# Patient Record
Sex: Male | Born: 1960 | Race: White | Hispanic: No | Marital: Single | Smoking: Current every day smoker
Health system: Southern US, Community
[De-identification: ages and names within clinical notes are randomized; demographics above are authoritative.]

## PROBLEM LIST (undated history)

## (undated) DIAGNOSIS — M1990 Unspecified osteoarthritis, unspecified site: Secondary | ICD-10-CM

## (undated) HISTORY — PX: SHOULDER SURGERY: SHX246

---

## 2008-04-16 ENCOUNTER — Emergency Department: Payer: Self-pay | Admitting: Emergency Medicine

## 2008-07-04 ENCOUNTER — Emergency Department: Payer: Self-pay | Admitting: Emergency Medicine

## 2009-12-18 ENCOUNTER — Emergency Department: Payer: Self-pay | Admitting: Internal Medicine

## 2010-09-26 ENCOUNTER — Ambulatory Visit: Payer: Self-pay

## 2011-02-07 ENCOUNTER — Ambulatory Visit: Payer: Self-pay | Admitting: Orthopedic Surgery

## 2011-02-10 ENCOUNTER — Ambulatory Visit: Payer: Self-pay | Admitting: Orthopedic Surgery

## 2013-03-31 ENCOUNTER — Emergency Department: Payer: Self-pay | Admitting: Emergency Medicine

## 2013-04-29 ENCOUNTER — Emergency Department: Payer: Self-pay | Admitting: Emergency Medicine

## 2015-11-09 ENCOUNTER — Emergency Department
Admission: EM | Admit: 2015-11-09 | Discharge: 2015-11-09 | Disposition: A | Discharge status: Home / Self Care | Payer: Self-pay | Attending: Emergency Medicine | Admitting: Emergency Medicine

## 2015-11-09 ENCOUNTER — Emergency Department: Payer: Self-pay

## 2015-11-09 ENCOUNTER — Encounter: Payer: Self-pay | Admitting: Emergency Medicine

## 2015-11-09 DIAGNOSIS — R079 Chest pain, unspecified: Secondary | ICD-10-CM | POA: Insufficient documentation

## 2015-11-09 LAB — CBC
HCT: 48.7 % (ref 40.0–52.0)
Hemoglobin: 15.9 g/dL (ref 13.0–18.0)
MCH: 31.8 pg (ref 26.0–34.0)
MCHC: 32.7 g/dL (ref 32.0–36.0)
MCV: 97.2 fL (ref 80.0–100.0)
PLATELETS: 292 10*3/uL (ref 150–440)
RBC: 5.01 MIL/uL (ref 4.40–5.90)
RDW: 14.5 % (ref 11.5–14.5)
WBC: 11.2 10*3/uL — AB (ref 3.8–10.6)

## 2015-11-09 LAB — BASIC METABOLIC PANEL
ANION GAP: 12 (ref 5–15)
BUN: 12 mg/dL (ref 6–20)
CALCIUM: 8.8 mg/dL — AB (ref 8.9–10.3)
CO2: 20 mmol/L — ABNORMAL LOW (ref 22–32)
CREATININE: 0.85 mg/dL (ref 0.61–1.24)
Chloride: 107 mmol/L (ref 101–111)
Glucose, Bld: 110 mg/dL — ABNORMAL HIGH (ref 65–99)
Potassium: 3.2 mmol/L — ABNORMAL LOW (ref 3.5–5.1)
SODIUM: 139 mmol/L (ref 135–145)

## 2015-11-09 LAB — TROPONIN I

## 2015-11-09 LAB — ETHANOL: ALCOHOL ETHYL (B): 153 mg/dL — AB (ref <5)

## 2015-11-09 MED ORDER — ASPIRIN 81 MG PO CHEW
324.0000 mg | CHEWABLE_TABLET | Freq: Once | ORAL | Status: AC
  Administered 2015-11-09: 324 mg via ORAL
  Filled 2015-11-09: qty 4

## 2015-11-09 MED ORDER — GI COCKTAIL ~~LOC~~
30.0000 mL | Freq: Once | ORAL | Status: AC
  Administered 2015-11-09: 30 mL via ORAL
  Filled 2015-11-09: qty 30

## 2015-11-09 NOTE — Discharge Instructions (Signed)
As we discussed please call the number provided to arrange a follow-up appointment as soon as possible to discuss further testing such as stress test, as well as your chest x-ray for further imaging, given possible mass. Please return to the emergency department for any further chest pain, trouble breathing, or any other symptom personally concerning to yourself.   Nonspecific Chest Pain It is often hard to find the cause of chest pain. There is always a chance that your pain could be related to something serious, such as a heart attack or a blood clot in your lungs. Chest pain can also be caused by conditions that are not life-threatening. If you have chest pain, it is very important to follow up with your doctor.  HOME CARE  If you were prescribed an antibiotic medicine, finish it all even if you start to feel better.  Avoid any activities that cause chest pain.  Do not use any tobacco products, including cigarettes, chewing tobacco, or electronic cigarettes. If you need help quitting, ask your doctor.  Do not drink alcohol.  Take medicines only as told by your doctor.  Keep all follow-up visits as told by your doctor. This is important. This includes any further testing if your chest pain does not go away.  Your doctor may tell you to keep your head raised (elevated) while you sleep.  Make lifestyle changes as told by your doctor. These may include:  Getting regular exercise. Ask your doctor to suggest some activities that are safe for you.  Eating a heart-healthy diet. Your doctor or a diet specialist (dietitian) can help you to learn healthy eating options.  Maintaining a healthy weight.  Managing diabetes, if necessary.  Reducing stress. GET HELP IF:  Your chest pain does not go away, even after treatment.  You have a rash with blisters on your chest.  You have a fever. GET HELP RIGHT AWAY IF:  Your chest pain is worse.  You have an increasing cough, or you cough up  blood.  You have severe belly (abdominal) pain.  You feel extremely weak.  You pass out (faint).  You have chills.  You have sudden, unexplained chest discomfort.  You have sudden, unexplained discomfort in your arms, back, neck, or jaw.  You have shortness of breath at any time.  You suddenly start to sweat, or your skin gets clammy.  You feel nauseous.  You vomit.  You suddenly feel light-headed or dizzy.  Your heart begins to beat quickly, or it feels like it is skipping beats. These symptoms may be an emergency. Do not wait to see if the symptoms will go away. Get medical help right away. Call your local emergency services (911 in the [place] Do not drive yourself to the hospital.   This information is not intended to replace advice given to you by your health care provider. Make sure you discuss any questions you have with your health care provider.   Document Released: 05/29/2008 Document Revised: 01/01/2015 Document Reviewed: 07/17/2014 Elsevier Interactive Patient Education [employer]2016 Elsevier Inc.

## 2015-11-09 NOTE — ED Provider Notes (Signed)
[facility] Emergency Department Provider Note  Time seen: 1:27 PM  I have reviewed the triage vital signs and the nursing notes.   HISTORY  Chief Complaint Chest Pain    HPI Duane Hayes is a 54 y.o. male with no past medical history who presents the emergency department with chest pain 2 days. According to the patient for the past 2 days he has been having intermittent central chest pain. States at times it'll be severe, shooting pains, at other times it will resolve completely. He states some association with nausea and diaphoresis during severe pain. None currently.Last episode of pain was this morning.     History reviewed. No pertinent past medical history.  There are no active problems to display for this patient.   History reviewed. No pertinent past surgical history.  No current outpatient prescriptions on file.  Allergies Review of patient's allergies indicates no known allergies.  History reviewed. No pertinent family history.  Social History Social History  Substance Use Topics  . Smoking status: Never Smoker   . Smokeless tobacco: None  . Alcohol Use: Yes    Review of Systems Constitutional: Negative for fever. Cardiovascular: As it for intermittent chest pain Respiratory: Negative for shortness of breath. Gastrointestinal: Negative for abdominal pain Musculoskeletal: Negative for back pain. Neurological: Negative for headache 10-point ROS otherwise negative.  ____________________________________________   PHYSICAL EXAM:  VITAL SIGNS: ED Triage Vitals  Enc Vitals Group     BP 11/09/15 1240 164/126 mmHg     Pulse Rate 11/09/15 1240 107     Resp 11/09/15 1240 28     Temp 11/09/15 1240 98 F (36.7 C)     Temp Source 11/09/15 1240 Oral     SpO2 11/09/15 1240 97 %     Weight 11/09/15 1240 150 lb (68.04 kg)     Height 11/09/15 1240  (1.753 m)     Head Cir --      Peak Flow --      Pain Score 11/09/15 1240  10     Pain Loc --      Pain Edu? --      Excl. in GC? --     Constitutional: Alert and oriented. Well appearing and in no distress. Eyes: Normal exam ENT   Head: Normocephalic and atraumatic.   Mouth/Throat: Mucous membranes are moist. Cardiovascular: Normal rate, regular rhythm. No murmur Respiratory: Normal respiratory effort without tachypnea nor retractions. Breath sounds are clear and equal bilaterally. No wheezes/rales/rhonchi. Nontender to palpation. Gastrointestinal: Soft and nontender. No distention.   Musculoskeletal: Nontender with normal range of motion in all extremities.  Neurologic:  Normal speech and language. No gross focal neurologic deficits  Skin:  Skin is warm, dry and intact.  Psychiatric: Mood and affect are normal. Speech and behavior are normal.   ____________________________________________    EKG  EKG reviewed and interpreted by myself shows sinus rhythm at 92 bpm, narrow QRS, normal axis, normal intervals, nonspecific ST changes present. No ST elevations  ____________________________________________    RADIOLOGY  Airway thickening suggesting bronchitis or reactive airway disease. Possible nipple shadow versus mass.  ____________________________________________   INITIAL IMPRESSION / ASSESSMENT AND PLAN / ED COURSE  Pertinent labs & imaging results that were available during my care of the patient were reviewed by me and considered in my medical decision making (see chart for details).  Patient presents for intermittent chest pain 2 days. We will check labs including troponin. Chest x-ray. EKG.  Labs are within normal limits. Troponin negative. EKG shows no concerning changes. Chest x-ray shows possible nipple shadow versus mass, discussed this with the patient the need to follow up with his primary care physician for further evaluation, as well as stress test. [facility] agreeable.    ____________________________________________   FINAL CLINICAL IMPRESSION(S) / ED DIAGNOSES  Chest pain   Minna AntisKevin Paislee Szatkowski, MD 11/09/15 [phone]

## 2015-11-09 NOTE — ED Notes (Signed)
Pt to ed with c/o chest pain that started yesterday,  Pt appears very anxious, leaning over in wheelchair, crying,  Pt states severe pain, reports sob, weakness, back pain, n/v, diaphoresis and dizziness associated with the pain.

## 2016-01-21 ENCOUNTER — Inpatient Hospital Stay
Admission: EM | Admit: 2016-01-21 | Discharge: 2016-02-07 | DRG: 917 | Payer: Self-pay | Attending: Internal Medicine | Admitting: Internal Medicine

## 2016-01-21 ENCOUNTER — Emergency Department: Payer: Self-pay

## 2016-01-21 ENCOUNTER — Encounter: Payer: Self-pay | Admitting: *Deleted

## 2016-01-21 DIAGNOSIS — W938XXA Exposure to other excessive cold of man-made origin, initial encounter: Secondary | ICD-10-CM

## 2016-01-21 DIAGNOSIS — R402232 Coma scale, best verbal response, inappropriate words, at arrival to emergency department: Secondary | ICD-10-CM | POA: Diagnosis present

## 2016-01-21 DIAGNOSIS — J690 Pneumonitis due to inhalation of food and vomit: Secondary | ICD-10-CM | POA: Diagnosis present

## 2016-01-21 DIAGNOSIS — R454 Irritability and anger: Secondary | ICD-10-CM | POA: Diagnosis present

## 2016-01-21 DIAGNOSIS — F10231 Alcohol dependence with withdrawal delirium: Secondary | ICD-10-CM | POA: Diagnosis not present

## 2016-01-21 DIAGNOSIS — Z23 Encounter for immunization: Secondary | ICD-10-CM

## 2016-01-21 DIAGNOSIS — R4182 Altered mental status, unspecified: Secondary | ICD-10-CM

## 2016-01-21 DIAGNOSIS — F1510 Other stimulant abuse, uncomplicated: Secondary | ICD-10-CM | POA: Diagnosis present

## 2016-01-21 DIAGNOSIS — I469 Cardiac arrest, cause unspecified: Secondary | ICD-10-CM | POA: Diagnosis present

## 2016-01-21 DIAGNOSIS — G92 Toxic encephalopathy: Secondary | ICD-10-CM | POA: Diagnosis present

## 2016-01-21 DIAGNOSIS — R0600 Dyspnea, unspecified: Secondary | ICD-10-CM

## 2016-01-21 DIAGNOSIS — F1410 Cocaine abuse, uncomplicated: Secondary | ICD-10-CM | POA: Diagnosis present

## 2016-01-21 DIAGNOSIS — R402112 Coma scale, eyes open, never, at arrival to emergency department: Secondary | ICD-10-CM | POA: Diagnosis present

## 2016-01-21 DIAGNOSIS — J189 Pneumonia, unspecified organism: Secondary | ICD-10-CM

## 2016-01-21 DIAGNOSIS — R402342 Coma scale, best motor response, flexion withdrawal, at arrival to emergency department: Secondary | ICD-10-CM | POA: Diagnosis present

## 2016-01-21 DIAGNOSIS — F1910 Other psychoactive substance abuse, uncomplicated: Secondary | ICD-10-CM

## 2016-01-21 DIAGNOSIS — F10129 Alcohol abuse with intoxication, unspecified: Secondary | ICD-10-CM | POA: Diagnosis present

## 2016-01-21 DIAGNOSIS — I959 Hypotension, unspecified: Secondary | ICD-10-CM | POA: Diagnosis present

## 2016-01-21 DIAGNOSIS — T68XXXA Hypothermia, initial encounter: Secondary | ICD-10-CM | POA: Diagnosis present

## 2016-01-21 DIAGNOSIS — Y900 Blood alcohol level of less than 20 mg/100 ml: Secondary | ICD-10-CM | POA: Diagnosis present

## 2016-01-21 DIAGNOSIS — F17210 Nicotine dependence, cigarettes, uncomplicated: Secondary | ICD-10-CM | POA: Diagnosis present

## 2016-01-21 DIAGNOSIS — E872 Acidosis: Secondary | ICD-10-CM | POA: Diagnosis present

## 2016-01-21 DIAGNOSIS — F1990 Other psychoactive substance use, unspecified, uncomplicated: Secondary | ICD-10-CM

## 2016-01-21 DIAGNOSIS — Z95828 Presence of other vascular implants and grafts: Secondary | ICD-10-CM

## 2016-01-21 DIAGNOSIS — R45851 Suicidal ideations: Secondary | ICD-10-CM | POA: Diagnosis not present

## 2016-01-21 DIAGNOSIS — T50904A Poisoning by unspecified drugs, medicaments and biological substances, undetermined, initial encounter: Principal | ICD-10-CM | POA: Diagnosis present

## 2016-01-21 DIAGNOSIS — E876 Hypokalemia: Secondary | ICD-10-CM | POA: Diagnosis not present

## 2016-01-21 DIAGNOSIS — G9340 Encephalopathy, unspecified: Secondary | ICD-10-CM

## 2016-01-21 DIAGNOSIS — L89329 Pressure ulcer of left buttock, unspecified stage: Secondary | ICD-10-CM | POA: Diagnosis present

## 2016-01-21 DIAGNOSIS — D649 Anemia, unspecified: Secondary | ICD-10-CM | POA: Diagnosis present

## 2016-01-21 DIAGNOSIS — F1210 Cannabis abuse, uncomplicated: Secondary | ICD-10-CM | POA: Diagnosis present

## 2016-01-21 DIAGNOSIS — R739 Hyperglycemia, unspecified: Secondary | ICD-10-CM | POA: Diagnosis present

## 2016-01-21 DIAGNOSIS — Y9259 Other trade areas as the place of occurrence of the external cause: Secondary | ICD-10-CM

## 2016-01-21 DIAGNOSIS — B9620 Unspecified Escherichia coli [E. coli] as the cause of diseases classified elsewhere: Secondary | ICD-10-CM | POA: Diagnosis present

## 2016-01-21 DIAGNOSIS — Z8674 Personal history of sudden cardiac arrest: Secondary | ICD-10-CM

## 2016-01-21 DIAGNOSIS — F1110 Opioid abuse, uncomplicated: Secondary | ICD-10-CM | POA: Diagnosis present

## 2016-01-21 DIAGNOSIS — J9601 Acute respiratory failure with hypoxia: Secondary | ICD-10-CM | POA: Diagnosis present

## 2016-01-21 DIAGNOSIS — I493 Ventricular premature depolarization: Secondary | ICD-10-CM | POA: Diagnosis not present

## 2016-01-21 DIAGNOSIS — R001 Bradycardia, unspecified: Secondary | ICD-10-CM | POA: Diagnosis present

## 2016-01-21 HISTORY — DX: Unspecified osteoarthritis, unspecified site: M19.90

## 2016-01-21 LAB — CBC WITH DIFFERENTIAL/PLATELET
Basophils Absolute: 0.1 10*3/uL (ref 0–0.1)
Basophils Relative: 1 %
Eosinophils Absolute: 0.2 10*3/uL (ref 0–0.7)
Eosinophils Relative: 2 %
HCT: 42.6 % (ref 40.0–52.0)
Hemoglobin: 14.1 g/dL (ref 13.0–18.0)
Lymphocytes Relative: 38 %
Lymphs Abs: 3.4 10*3/uL (ref 1.0–3.6)
MCH: 31.8 pg (ref 26.0–34.0)
MCHC: 33.1 g/dL (ref 32.0–36.0)
MCV: 96.3 fL (ref 80.0–100.0)
Monocytes Absolute: 0.5 10*3/uL (ref 0.2–1.0)
Monocytes Relative: 6 %
Neutro Abs: 4.8 10*3/uL (ref 1.4–6.5)
Neutrophils Relative %: 53 %
Platelets: 252 10*3/uL (ref 150–440)
RBC: 4.42 MIL/uL (ref 4.40–5.90)
RDW: 14.8 % — ABNORMAL HIGH (ref 11.5–14.5)
WBC: 9 10*3/uL (ref 3.8–10.6)

## 2016-01-21 LAB — COMPREHENSIVE METABOLIC PANEL
ALT: 37 U/L (ref 17–63)
ALT: 32 U/L (ref 17–63)
AST: 38 U/L (ref 15–41)
AST: 36 U/L (ref 15–41)
Albumin: 4.1 g/dL (ref 3.5–5.0)
Albumin: 3.2 g/dL — ABNORMAL LOW (ref 3.5–5.0)
Alkaline Phosphatase: 113 U/L (ref 38–126)
Alkaline Phosphatase: 84 U/L (ref 38–126)
Anion gap: 17 — ABNORMAL HIGH (ref 5–15)
Anion gap: 10 (ref 5–15)
BILIRUBIN TOTAL: 0.8 mg/dL (ref 0.3–1.2)
BILIRUBIN TOTAL: 1 mg/dL (ref 0.3–1.2)
BUN: 12 mg/dL (ref 6–20)
BUN: 14 mg/dL (ref 6–20)
CALCIUM: 7.4 mg/dL — AB (ref 8.9–10.3)
CHLORIDE: 111 mmol/L (ref 101–111)
CO2: 19 mmol/L — ABNORMAL LOW (ref 22–32)
CO2: 24 mmol/L (ref 22–32)
CREATININE: 1.21 mg/dL (ref 0.61–1.24)
CREATININE: 0.87 mg/dL (ref 0.61–1.24)
Calcium: 8.2 mg/dL — ABNORMAL LOW (ref 8.9–10.3)
Chloride: 103 mmol/L (ref 101–111)
Glucose, Bld: 339 mg/dL — ABNORMAL HIGH (ref 65–99)
Glucose, Bld: 83 mg/dL (ref 65–99)
POTASSIUM: 3.6 mmol/L (ref 3.5–5.1)
Potassium: 3.8 mmol/L (ref 3.5–5.1)
Sodium: 139 mmol/L (ref 135–145)
Sodium: 145 mmol/L (ref 135–145)
TOTAL PROTEIN: 7.1 g/dL (ref 6.5–8.1)
TOTAL PROTEIN: 5.5 g/dL — AB (ref 6.5–8.1)

## 2016-01-21 LAB — BLOOD GAS, ARTERIAL
ACID-BASE DEFICIT: 14.6 mmol/L — AB (ref 0.0–2.0)
ACID-BASE EXCESS: 1.3 mmol/L (ref 0.0–3.0)
Allens test (pass/fail): POSITIVE — AB
Allens test (pass/fail): POSITIVE — AB
BICARBONATE: 14.9 meq/L — AB (ref 21.0–28.0)
BICARBONATE: 27.2 meq/L (ref 21.0–28.0)
FIO2: 0.4
FIO2: 0.3
LHR: 16 {breaths}/min
MECHVT: 450 mL
Mechanical Rate: 16
O2 Saturation: 89.4 %
O2 Saturation: 98.1 %
PATIENT TEMPERATURE: 37
PEEP/CPAP: 5 cmH2O
PEEP/CPAP: 5 cmH2O
PH ART: 7.37 (ref 7.350–7.450)
PO2 ART: 78 mmHg — AB (ref 83.0–108.0)
Patient temperature: 37
RATE: 18 resp/min
VT: 450 mL
pCO2 arterial: 48 mmHg (ref 32.0–48.0)
pCO2 arterial: 47 mmHg (ref 32.0–48.0)
pH, Arterial: 7.1 — CL (ref 7.350–7.450)
pO2, Arterial: 108 mmHg (ref 83.0–108.0)

## 2016-01-21 LAB — CBC
HEMATOCRIT: 36.9 % — AB (ref 40.0–52.0)
Hemoglobin: 12.4 g/dL — ABNORMAL LOW (ref 13.0–18.0)
MCH: 31.5 pg (ref 26.0–34.0)
MCHC: 33.5 g/dL (ref 32.0–36.0)
MCV: 93.8 fL (ref 80.0–100.0)
PLATELETS: 211 10*3/uL (ref 150–440)
RBC: 3.93 MIL/uL — AB (ref 4.40–5.90)
RDW: 14.4 % (ref 11.5–14.5)
WBC: 10.4 10*3/uL (ref 3.8–10.6)

## 2016-01-21 LAB — URINALYSIS COMPLETE WITH MICROSCOPIC (ARMC ONLY)
Bacteria, UA: NONE SEEN
Bilirubin Urine: NEGATIVE
Glucose, UA: >500 mg/dL — AB
Hgb urine dipstick: NEGATIVE
Ketones, ur: NEGATIVE mg/dL
Leukocytes, UA: NEGATIVE
Nitrite: NEGATIVE
Protein, ur: 30 mg/dL — AB
Specific Gravity, Urine: 1.011 (ref 1.005–1.030)
pH: 6 (ref 5.0–8.0)

## 2016-01-21 LAB — TSH: TSH: 2.145 u[IU]/mL (ref 0.350–4.500)

## 2016-01-21 LAB — TRIGLYCERIDES: Triglycerides: 97 mg/dL (ref <150)

## 2016-01-21 LAB — ACETAMINOPHEN LEVEL

## 2016-01-21 LAB — MRSA PCR SCREENING: MRSA by PCR: NEGATIVE

## 2016-01-21 LAB — URINE DRUG SCREEN, QUALITATIVE (ARMC ONLY)
Amphetamines, Ur Screen: POSITIVE — AB
BENZODIAZEPINE, UR SCRN: NOT DETECTED
Barbiturates, Ur Screen: NOT DETECTED
CANNABINOID 50 NG, UR ~~LOC~~: POSITIVE — AB
Cocaine Metabolite,Ur ~~LOC~~: POSITIVE — AB
MDMA (Ecstasy)Ur Screen: NOT DETECTED
Methadone Scn, Ur: NOT DETECTED
Opiate, Ur Screen: NOT DETECTED
PHENCYCLIDINE (PCP) UR S: NOT DETECTED
TRICYCLIC, UR SCREEN: POSITIVE — AB

## 2016-01-21 LAB — GLUCOSE, CAPILLARY
Glucose-Capillary: 76 mg/dL (ref 65–99)
Glucose-Capillary: 87 mg/dL (ref 65–99)
Glucose-Capillary: 90 mg/dL (ref 65–99)

## 2016-01-21 LAB — MAGNESIUM: Magnesium: 2.7 mg/dL — ABNORMAL HIGH (ref 1.7–2.4)

## 2016-01-21 LAB — TROPONIN I

## 2016-01-21 LAB — SALICYLATE LEVEL: Salicylate Lvl: <4 mg/dL (ref 2.8–30.0)

## 2016-01-21 LAB — ETHANOL: Alcohol, Ethyl (B): 131 mg/dL — ABNORMAL HIGH (ref <5)

## 2016-01-21 LAB — OSMOLALITY: Osmolality: 337 mOsm/kg (ref 275–295)

## 2016-01-21 LAB — HEMOGLOBIN A1C: Hgb A1c MFr Bld: 5.6 % (ref 4.0–6.0)

## 2016-01-21 MED ORDER — PNEUMOCOCCAL VAC POLYVALENT 25 MCG/0.5ML IJ INJ
0.5000 mL | INJECTION | INTRAMUSCULAR | Status: DC
  Filled 2016-01-21: qty 0.5

## 2016-01-21 MED ORDER — SODIUM CHLORIDE 0.9 % IV SOLN
3.0000 g | Freq: Four times a day (QID) | INTRAVENOUS | Status: DC
  Administered 2016-01-21 – 2016-01-31 (×38): 3 g via INTRAVENOUS
  Filled 2016-01-21 (×42): qty 3

## 2016-01-21 MED ORDER — PROPOFOL 10 MG/ML IV BOLUS
40.0000 mg | Freq: Once | INTRAVENOUS | Status: AC
  Administered 2016-01-21: 40 mg via INTRAVENOUS

## 2016-01-21 MED ORDER — CALCIUM GLUCONATE 10 % IV SOLN
INTRAVENOUS | Status: AC
  Filled 2016-01-21: qty 10

## 2016-01-21 MED ORDER — MIDAZOLAM HCL 2 MG/2ML IJ SOLN: 2.0000 mg | INTRAMUSCULAR | Status: DC | PRN

## 2016-01-21 MED ORDER — ACETAMINOPHEN 325 MG PO TABS
650.0000 mg | ORAL_TABLET | Freq: Four times a day (QID) | ORAL | Status: DC | PRN
  Administered 2016-01-22 – 2016-02-07 (×5): 650 mg via ORAL
  Filled 2016-01-21 (×5): qty 2

## 2016-01-21 MED ORDER — SODIUM CHLORIDE 0.9 % IV SOLN
INTRAVENOUS | Status: DC
  Administered 2016-01-21 (×2): via INTRAVENOUS

## 2016-01-21 MED ORDER — ETOMIDATE 2 MG/ML IV SOLN
20.0000 mg | Freq: Once | INTRAVENOUS | Status: AC
  Administered 2016-01-21: 20 mg via INTRAVENOUS

## 2016-01-21 MED ORDER — FOLIC ACID 5 MG/ML IJ SOLN
Freq: Every day | INTRAMUSCULAR | Status: DC
  Administered 2016-01-21 – 2016-01-24 (×4): via INTRAVENOUS
  Filled 2016-01-21 (×4): qty 50

## 2016-01-21 MED ORDER — FOLIC ACID 5 MG/ML IJ SOLN: 1.0000 mg | Freq: Every day | INTRAMUSCULAR | Status: DC

## 2016-01-21 MED ORDER — PANTOPRAZOLE SODIUM 40 MG IV SOLR
40.0000 mg | INTRAVENOUS | Status: DC
  Administered 2016-01-21 – 2016-01-23 (×3): 40 mg via INTRAVENOUS
  Filled 2016-01-21 (×3): qty 40

## 2016-01-21 MED ORDER — SODIUM CHLORIDE 0.9 % IV BOLUS (SEPSIS)
1000.0000 mL | INTRAVENOUS | Status: AC
  Administered 2016-01-21: 1000 mL via INTRAVENOUS

## 2016-01-21 MED ORDER — PROPOFOL 1000 MG/100ML IV EMUL
5.0000 ug/kg/min | INTRAVENOUS | Status: DC
  Administered 2016-01-21: 60 mg/h via INTRAVENOUS
  Administered 2016-01-21: 10 ug/kg/min via INTRAVENOUS
  Administered 2016-01-21: 5 ug/kg/min via INTRAVENOUS

## 2016-01-21 MED ORDER — INFLUENZA VAC SPLIT QUAD 0.5 ML IM SUSY
0.5000 mL | PREFILLED_SYRINGE | INTRAMUSCULAR | Status: DC
  Filled 2016-01-21: qty 0.5

## 2016-01-21 MED ORDER — THIAMINE HCL 100 MG/ML IJ SOLN
100.0000 mg | Freq: Once | INTRAMUSCULAR | Status: AC
  Administered 2016-01-21: 100 mg via INTRAVENOUS
  Filled 2016-01-21: qty 2

## 2016-01-21 MED ORDER — ROCURONIUM BROMIDE 50 MG/5ML IV SOLN
100.0000 mg | Freq: Once | INTRAVENOUS | Status: AC
  Administered 2016-01-21: 100 mg via INTRAVENOUS

## 2016-01-21 MED ORDER — FENTANYL 2500MCG IN NS 250ML (10MCG/ML) PREMIX INFUSION
150.0000 ug/h | INTRAVENOUS | Status: DC
  Administered 2016-01-21: 150 ug/h via INTRAVENOUS
  Administered 2016-01-21 – 2016-01-22 (×2): 175 ug/h via INTRAVENOUS
  Filled 2016-01-21 (×2): qty 250

## 2016-01-21 MED ORDER — CHLORHEXIDINE GLUCONATE 0.12% ORAL RINSE (MEDLINE KIT)
15.0000 mL | Freq: Two times a day (BID) | OROMUCOSAL | Status: DC
  Administered 2016-01-21 – 2016-01-26 (×11): 15 mL via OROMUCOSAL
  Filled 2016-01-21 (×13): qty 15

## 2016-01-21 MED ORDER — PROPOFOL 1000 MG/100ML IV EMUL
INTRAVENOUS | Status: AC
  Administered 2016-01-21: 5 ug/kg/min via INTRAVENOUS
  Filled 2016-01-21: qty 100

## 2016-01-21 MED ORDER — ONDANSETRON 4 MG PO TBDP: ORAL_TABLET | ORAL | Status: DC

## 2016-01-21 MED ORDER — SODIUM CHLORIDE 0.9 % IV SOLN
1.5000 g | Freq: Four times a day (QID) | INTRAVENOUS | Status: DC
  Filled 2016-01-21: qty 1.5

## 2016-01-21 MED ORDER — PROPOFOL 1000 MG/100ML IV EMUL: 5.0000 ug/kg/min | INTRAVENOUS | Status: DC

## 2016-01-21 MED ORDER — PROPOFOL 1000 MG/100ML IV EMUL: 0.0000 ug/kg/min | INTRAVENOUS | Status: DC

## 2016-01-21 MED ORDER — MIDAZOLAM HCL 5 MG/5ML IJ SOLN
4.0000 mg | Freq: Once | INTRAMUSCULAR | Status: AC
  Administered 2016-01-21: 4 mg via INTRAVENOUS

## 2016-01-21 MED ORDER — ONDANSETRON HCL 4 MG/2ML IJ SOLN: 4.0000 mg | Freq: Four times a day (QID) | INTRAMUSCULAR | Status: DC | PRN

## 2016-01-21 MED ORDER — HEPARIN SODIUM (PORCINE) 5000 UNIT/ML IJ SOLN
5000.0000 [IU] | Freq: Three times a day (TID) | INTRAMUSCULAR | Status: DC
  Administered 2016-01-21 – 2016-02-01 (×34): 5000 [IU] via SUBCUTANEOUS
  Filled 2016-01-21 (×34): qty 1

## 2016-01-21 MED ORDER — SODIUM CHLORIDE 0.9% FLUSH
3.0000 mL | Freq: Two times a day (BID) | INTRAVENOUS | Status: DC
  Administered 2016-01-21 – 2016-02-07 (×29): 3 mL via INTRAVENOUS

## 2016-01-21 MED ORDER — CALCIUM GLUCONATE 10 % IV SOLN
1.0000 g | Freq: Once | INTRAVENOUS | Status: AC
  Administered 2016-01-21: 1 g via INTRAVENOUS
  Filled 2016-01-21: qty 10

## 2016-01-21 MED ORDER — FENTANYL CITRATE (PF) 100 MCG/2ML IJ SOLN
50.0000 ug | INTRAMUSCULAR | Status: DC | PRN
Start: 2016-01-21 — End: 2016-01-22

## 2016-01-21 MED ORDER — INSULIN ASPART 100 UNIT/ML ~~LOC~~ SOLN
2.0000 [IU] | SUBCUTANEOUS | Status: DC
  Administered 2016-01-23 – 2016-01-25 (×3): 2 [IU] via SUBCUTANEOUS
  Administered 2016-01-25: 4 [IU] via SUBCUTANEOUS
  Administered 2016-01-25: 2 [IU] via SUBCUTANEOUS
  Administered 2016-01-26 (×2): 4 [IU] via SUBCUTANEOUS
  Administered 2016-01-26 – 2016-01-28 (×4): 2 [IU] via SUBCUTANEOUS
  Filled 2016-01-21: qty 4
  Filled 2016-01-21 (×6): qty 2
  Filled 2016-01-21: qty 4
  Filled 2016-01-21: qty 2
  Filled 2016-01-21: qty 4
  Filled 2016-01-21: qty 2
  Filled 2016-01-21: qty 4

## 2016-01-21 MED ORDER — THIAMINE HCL 100 MG/ML IJ SOLN
100.0000 mg | Freq: Every day | INTRAMUSCULAR | Status: DC
  Administered 2016-01-22: 100 mg via INTRAVENOUS
  Administered 2016-01-23: 200 mg via INTRAVENOUS
  Administered 2016-01-24: 100 mg via INTRAVENOUS
  Filled 2016-01-21 (×3): qty 2

## 2016-01-21 MED ORDER — FENTANYL CITRATE (PF) 100 MCG/2ML IJ SOLN: 50.0000 ug | INTRAMUSCULAR | Status: DC | PRN

## 2016-01-21 MED ORDER — AMPICILLIN-SULBACTAM SODIUM 3 (2-1) G IJ SOLR
3.0000 g | INTRAMUSCULAR | Status: AC
  Administered 2016-01-21: 3 g via INTRAVENOUS
  Filled 2016-01-21: qty 3

## 2016-01-21 MED ORDER — ACETAMINOPHEN 650 MG RE SUPP: 650.0000 mg | Freq: Four times a day (QID) | RECTAL | Status: DC | PRN

## 2016-01-21 MED ORDER — ONDANSETRON HCL 4 MG PO TABS: 4.0000 mg | ORAL_TABLET | Freq: Four times a day (QID) | ORAL | Status: DC | PRN

## 2016-01-21 MED ORDER — ANTISEPTIC ORAL RINSE SOLUTION (CORINZ)
7.0000 mL | Freq: Four times a day (QID) | OROMUCOSAL | Status: DC
  Administered 2016-01-21 – 2016-01-26 (×20): 7 mL via OROMUCOSAL
  Filled 2016-01-21 (×23): qty 7

## 2016-01-21 MED ORDER — SODIUM CHLORIDE 0.9 % IV SOLN
1.0000 mg/h | INTRAVENOUS | Status: DC
  Administered 2016-01-21: 1 mg/h via INTRAVENOUS
  Administered 2016-01-21: 2 mg/h via INTRAVENOUS
  Filled 2016-01-21 (×2): qty 10

## 2016-01-21 MED ORDER — MIDAZOLAM HCL 5 MG/5ML IJ SOLN
INTRAMUSCULAR | Status: AC
  Administered 2016-01-21: 4 mg via INTRAVENOUS
  Filled 2016-01-21: qty 5

## 2016-01-21 MED ORDER — SODIUM CHLORIDE 0.9 % IV BOLUS (SEPSIS)
1000.0000 mL | Freq: Once | INTRAVENOUS | Status: AC
  Administered 2016-01-21: 1000 mL via INTRAVENOUS

## 2016-01-21 MED ORDER — MIDAZOLAM HCL 2 MG/2ML IJ SOLN
4.0000 mg | Freq: Once | INTRAMUSCULAR | Status: AC
  Administered 2016-01-21: 4 mg via INTRAVENOUS

## 2016-01-21 MED ORDER — SODIUM BICARBONATE 8.4 % IV SOLN
100.0000 meq | Freq: Once | INTRAVENOUS | Status: AC
  Administered 2016-01-21: 100 meq via INTRAVENOUS
  Filled 2016-01-21: qty 100

## 2016-01-21 MED ORDER — FENTANYL 2500MCG IN NS 250ML (10MCG/ML) PREMIX INFUSION
INTRAVENOUS | Status: AC
  Administered 2016-01-21: 150 ug/h via INTRAVENOUS
  Filled 2016-01-21: qty 250

## 2016-01-21 NOTE — Progress Notes (Signed)
Discussed with Dr. Dema Severin during rounds that per xray patient's OG tube is coiled in stomach and MD stated that it would be readressed in the morning with xray and that patient should be able to be extubated in the morning.

## 2016-01-21 NOTE — Consult Note (Signed)
PULMONARY / CRITICAL CARE MEDICINE   Name: Duane Hayes MRN: [id number] DOB: 07-13-1961    ADMISSION DATE:  01/21/2016 CONSULTATION DATE:  01/21/16  REFERRING MD :  Dr. Enedina Finner   CHIEF COMPLAINT:     Unresponsive Reason for consult - ventilator and critical care management.    HISTORY OF PRESENT ILLNESS  History per chart review - patient intubated and sedated.    The patient presents the emergency department via EMS on 1/27 AM after being found unconscious in a motel room. The patient was found with ice all over his body. Initially the patient's pulse was about 20 bpm and respirations were shallow, per records. The patient was given Narcan and became very agitated. During an attempt to subdue the patient in order to supply supplemental oxygen he lost a pulse. CPR was started and achieved return of circulation. The patient was intubated continue to thrash. EMS personnel took this to mean that the patient may protect his airway and subsequently extubated him. However upon arrival in the emergency department the patient was not protecting his airway which prompted reintubation. He was placed on mechanical ventilation and transferred to the ICU.  Review of labs showed elevated EtOH level, Urine tox positive for amphetamines, cannabis, TCAs.    SIGNIFICANT EVENTS  1/27 - found unresponsive,  given narcan,severely agitated, then loss of pulse, short course of CPR, intubated, but severely agitated,extubated, could not protect airway, reintubated and transferred to the ICU    PAST MEDICAL HISTORY    :  No past medical history on file. No past surgical history on file. Prior to Admission medications   Not on File   No Known Allergies   FAMILY HISTORY   No family history on file.    SOCIAL HISTORY    reports that he has never smoked. He does not have any smokeless tobacco history on file. He reports that he drinks alcohol. He reports that he does not use illicit  drugs.  Review of Systems  Unable to perform ROS: intubated      VITAL SIGNS    Temp:  [92.9 F (33.8 C)-98.4 F (36.9 C)] 98.4 F (36.9 C) (01/27 0840) Pulse Rate:  [90-110] 99 (01/27 0840) Resp:  [16-25] 20 (01/27 0840) BP: (77-144)/(58-104) 118/80 mmHg (01/27 0840) SpO2:  [85 %-100 %] 100 % (01/27 0840) FiO2 (%):  [40 %-50 %] 40 % (01/27 0852) Weight:  [180 lb 12.4 oz (82 kg)] 180 lb 12.4 oz (82 kg) (01/27 0305) HEMODYNAMICS:   VENTILATOR SETTINGS: Vent Mode:  [-] PRVC FiO2 (%):  [40 %-50 %] 40 % Set Rate:  [16 bmp-18 bmp] 18 bmp Vt Set:  [450 mL] 450 mL PEEP:  [5 cmH20] 5 cmH20 Plateau Pressure:  [14 cmH20] 14 cmH20 INTAKE / OUTPUT:  Intake/Output Summary (Last 24 hours) at 01/21/16 4098 Last data filed at 01/21/16 0800  Gross per 24 hour  Intake   19.5 ml  Output   1700 ml  Net -1680.5 ml       PHYSICAL EXAM   Physical Exam  Constitutional: He appears well-developed and well-nourished.  HENT:  Head: Normocephalic and atraumatic.  Right Ear: External ear normal.  Left Ear: External ear normal.  Nose: Nose normal.  Mouth/Throat: Oropharynx is clear and moist.  Eyes: Right eye exhibits no discharge. Left eye exhibits no discharge. No scleral icterus.  Neck: Neck supple. No JVD present. No tracheal deviation present. No thyromegaly present.  Cardiovascular: Normal rate, regular rhythm, normal heart  sounds and intact distal pulses.   No murmur heard. Pulmonary/Chest: He has no wheezes. He has no rales.  Abdominal: Soft. Bowel sounds are normal. He exhibits no distension. There is no tenderness.  Musculoskeletal: He exhibits no edema.  Neurological:  Intubated and sedated.  Will follow some simple commands.   Skin: Skin is warm and dry. No erythema.  Nursing note and vitals reviewed.      LABS   LABS:  CBC  Recent Labs Lab 01/21/16 0257  WBC 9.0  HGB 14.1  HCT 42.6  PLT 252   Coag's No results for input(s): APTT, INR in the last 168  hours. BMET  Recent Labs Lab 01/21/16 0257  NA 139  K 3.6  CL 103  CO2 19*  BUN 12  CREATININE 1.21  GLUCOSE 339*   Electrolytes  Recent Labs Lab 01/21/16 0257  CALCIUM 8.2*  MG 2.7*   Sepsis Markers No results for input(s): LATICACIDVEN, PROCALCITON, O2SATVEN in the last 168 hours. ABG  Recent Labs Lab 01/21/16 0355  PHART 7.10*  PCO2ART 48  PO2ART 78*   Liver Enzymes  Recent Labs Lab 01/21/16 0257  AST 38  ALT 37  ALKPHOS 113  BILITOT 0.8  ALBUMIN 4.1   Cardiac Enzymes  Recent Labs Lab 01/21/16 0257  TROPONINI <0.03   Glucose No results for input(s): GLUCAP in the last 168 hours.   No results found for this or any previous visit (from the past 240 hour(s)).   Current facility-administered medications:  .  0.9 %  sodium chloride infusion, , Intravenous, Continuous, Arnaldo Natal, MD, Last Rate: 125 mL/hr at 01/21/16 0912 .  acetaminophen (TYLENOL) tablet 650 mg, 650 mg, Oral, Q6H PRN **OR** acetaminophen (TYLENOL) suppository 650 mg, 650 mg, Rectal, Q6H PRN, Arnaldo Natal, MD .  calcium gluconate 10 % injection, , , ,  .  fentaNYL (SUBLIMAZE) injection 50 mcg, 50 mcg, Intravenous, Q15 min PRN, Arnaldo Natal, MD .  fentaNYL (SUBLIMAZE) injection 50 mcg, 50 mcg, Intravenous, Q2H PRN, Arnaldo Natal, MD .  fentaNYL in NS (72mcg/ml) infusion-PREMIX, 150 mcg/hr, Intravenous, Continuous, Loleta Rose, MD, Last Rate: 17.5 mL/hr at 01/21/16 0701, 175 mcg/hr at 01/21/16 0701 .  folic acid 1 mg in sodium chloride 0.9% 50 mL PB, , Intravenous, Daily, Katha Hamming, MD .  heparin injection 5,000 Units, 5,000 Units, Subcutaneous, 3 times per day, Arnaldo Natal, MD .  midazolam (VERSED) 50 mg in sodium chloride 0.9 % 50 mL (1 mg/mL) infusion, 1 mg/hr, Intravenous, Continuous, Loleta Rose, MD, Last Rate: 2 mL/hr at 01/21/16 0653, 2 mg/hr at 01/21/16 0653 .  midazolam (VERSED) injection 2 mg, 2 mg, Intravenous, Q15 min PRN,  Arnaldo Natal, MD .  midazolam (VERSED) injection 2 mg, 2 mg, Intravenous, Q2H PRN, Arnaldo Natal, MD .  ondansetron [facility]) tablet 4 mg, 4 mg, Oral, Q6H PRN **OR** ondansetron (ZOFRAN) injection 4 mg, 4 mg, Intravenous, Q6H PRN, Arnaldo Natal, MD .  propofol (DIPRIVAN) 1000 MG/100ML infusion, 0-50 mcg/kg/min, Intravenous, Continuous, Arnaldo Natal, MD, 0 mcg/kg/min at 01/21/16 [phone] .  sodium chloride flush (NS) 0.9 % injection 3 mL, 3 mL, Intravenous, Q12H, Arnaldo Natal, MD, 3 mL at 01/21/16 0917  IMAGING    Ct Head Wo Contrast  01/21/2016  CLINICAL DATA:  Intubated patient. Unresponsive. Possible overdose. Uncertain if trauma. EXAM: CT HEAD WITHOUT CONTRAST TECHNIQUE: Contiguous axial images were obtained from the base of the skull through the vertex  without intravenous contrast. COMPARISON:  None. FINDINGS: Ventricles and sulci appear symmetrical. No ventricular dilatation. No mass effect or midline shift. No abnormal extra-axial fluid collections. Gray-white matter junctions are distinct. Basal cisterns are not effaced. No evidence of acute intracranial hemorrhage. No depressed skull fractures. Visualized paranasal sinuses and mastoid air cells are not opacified. Vascular calcifications. Old appearing nasal bone fractures. IMPRESSION: No acute intracranial abnormalities. Electronically Signed   By: Burman Nieves M.D.   On: 01/21/2016 03:45   Dg Chest Portable 1 View  01/21/2016  CLINICAL DATA:  Unresponsive patient.  Intubation. EXAM: PORTABLE CHEST 1 VIEW COMPARISON:  11/09/2015 FINDINGS: Endotracheal tube placed with tip measuring 4.8 cm above the carina. Enteric tube placed with tip in the left upper quadrant consistent with location in the upper stomach. Heart size is normal. There is new bilateral perihilar airspace disease suggesting edema or bilateral pneumonia. Right lung nodules likely representing calcified granulomas. No blunting of costophrenic angles. No  pneumothorax. Mediastinal contours appear intact. IMPRESSION: Appliances appear in satisfactory location. New development of bilateral perihilar airspace disease suggesting pneumonia or edema. Electronically Signed   By: Burman Nieves M.D.   On: 01/21/2016 03:35   Dg Abd Portable 1v  01/21/2016  CLINICAL DATA:  Patient unresponsive. Orogastric tube placement. Initial encounter. EXAM: PORTABLE ABDOMEN - 1 VIEW COMPARISON:  None. FINDINGS: The patient's enteric tube is noted coiling at the fundus of the stomach. The visualized bowel gas pattern is unremarkable. Scattered air and stool filled loops of colon are seen; no abnormal dilatation of small bowel loops is seen to suggest small bowel obstruction. No free intra-abdominal air is identified, though evaluation for free air is limited on a single supine view. The visualized osseous structures are within normal limits; the sacroiliac joints are unremarkable in appearance. The visualized lung bases are essentially clear. IMPRESSION: Enteric tube noted coiling at the fundus of the stomach. Electronically Signed   By: Roanna Raider M.D.   On: 01/21/2016 03:35      Indwelling Urinary Catheter continued, requirement due to   Reason to continue Indwelling Urinary Catheter for strict Intake/Output monitoring for hemodynamic instability   Central Line continued, requirement due to   Reason to continue [employer] Monitoring of central venous pressure or other hemodynamic parameters   Ventilator continued, requirement due to, resp failure    Ventilator Sedation RASS 0 to -2   Cultures: BCx2  UC  Sputum  Antibiotics: Unasyn 1/27>>  Lines:   ASSESSMENT/PLAN  55 yo male found unresponsive in the field, received narcan, had CPR for 2 minutes, intubated, transferred to ICU for further management and care. Urine tox positive for tricyclics, cocaine, amphetamines; alcohol level greater than 100.  PULMONARY Acute respiratory failure Metabolic  acidosis Drug overdose/polysubstance abuse P:   -continue mechanical ventilation, wean as tolerated -Maintain O2 saturations greater than 88% -Continue with sedation/analgesia regiment, perform SBT/WUA daily -ABG as needed -Currently with moderate agitation at times, but will follow simple commands, plan to continue intubation given high level of alcohol and findings on urine toxicology, may be able to be extubated within the next 24-48 hours.  CARDIOVASCULAR -continue with hemodynamic monitoring  RENAL Metabolic Acidosis - received 2amp of bicarb in the ED, now with improvement.  - cont with IVFs - ABG as needed.  - ICU electrolyte protocol  GASTROINTESTINAL SUP - PPI EtOH sue - monitor EtOH level - cont with IVF, thiamine, Folic Acid and MVI  HEMATOLOGIC -follow cbc  INFECTIOUS - currently on unasyn  for possible aspiration  ENDOCRINE -ICU hypo/hyperglycemic protocol  NEUROLOGIC RASS goal: 1 - sedation/analgesia wean as tolerated.    I have personally obtained a history, examined the patient, evaluated laboratory and imaging results, formulated the assessment and plan and placed orders.  The Patient requires high complexity decision making for assessment and support, frequent evaluation and titration of therapies, application of advanced monitoring technologies and extensive interpretation of multiple databases. Critical Care Time devoted to patient care services described in this note is 55 minutes.    Stephanie Acre, MD [facility] Pager [phone] (please enter 7-digits) On Call Pager - [phone] (please enter 7-digits)     01/21/2016, 9:23 AM  Note: This note was prepared with Dragon dictation along with smaller phrase technology. Any transcriptional errors that result from this process are unintentional.

## 2016-01-21 NOTE — ED Provider Notes (Addendum)
[facility] Emergency Department Provider Note  ____________________________________________  Time seen: Approximately 3:13 AM  I have reviewed the triage vital signs and the nursing notes.   HISTORY  Chief Complaint Unresponsive / cardiopulmonary arrest in the field  History is limited by the patient's acute altered mental status and probable intoxication  HPI Duane Hayes is a 55 y.o. male who is unable to provide any medical history and arrived by EMS after a call out for unresponsiveness.  He was found in a motel bathroom with ice on and around him with agonal respirations and unresponsive.  There is probable drug use and the paramedics questioned her one or fentanyl but it is unclear if there was evidence of any of these substances at the scene.  Additionally the patient smells of alcohol.    On the scene the paramedics began to bag the patient and shortly after their arrival the patient lost his pulse.  CPR was performed and the patient was intubated.  After several minutes he did have a spontaneous return of circulation.  He received 2 mg of Narcan intranasally followed by 4 mg of Narcan by IV.  After that the patient began respondingand began gagging forcibly so the paramedics removed the endotracheal tube and transported to the emergency department.    The patient remains acutely altered, moaning and intermittently yelling but is unable to provide any history and is combative.  Symptoms are severe.  The onset is uncertain.   No past medical history on file.  Patient Active Problem List   Diagnosis Date Noted  . Respiratory failure with hypoxia (HCC) 01/21/2016    No past surgical history on file.  No current outpatient prescriptions on file.  Allergies Review of patient's allergies indicates no known allergies.  No family history on file.  Social History Social History  Substance Use Topics  . Smoking status: Never Smoker   . Smokeless  tobacco: Not on file  . Alcohol Use: Yes    Review of Systems Unable to obtain due to acute altered mental status and critical illness  ____________________________________________   PHYSICAL EXAM:  ED Triage Vitals  Enc Vitals Group     BP 01/21/16 0322 144/102 mmHg     Pulse Rate 01/21/16 0322 102     Resp 01/21/16 0322 19     Temp --      Temp src --      SpO2 01/21/16 0322 100 %     Weight 01/21/16 0305 180 lb 12.4 oz (82 kg)     Height --      Head Cir --      Peak Flow --      Pain Score --      Pain Loc --      Pain Edu? --      Excl. in GC? --      Constitutional: Disheveled.  Not responding to commands.  Intermittently moaning and yelling.   Eyes: Conjunctivae are normal. PERRL. EOMI.  Will not track. Head: Atraumatic. Nose: No congestion/rhinnorhea. Mouth/Throat: Edentulous.  Mucous membranes are moist.  Oropharynx non-erythematous. Neck: No stridor.   Cardiovascular: Borderline tachycardia, regular rhythm. Grossly normal heart sounds.  Decreased capillary refill Respiratory: Normal respiratory effort.  No retractions. Lungs CTAB. Gastrointestinal: Soft and non-distended. Musculoskeletal: No lower extremity tenderness nor edema.  No joint effusions. Neurologic:  GCS 8 (E1, V3, M4) Skin:  Skin is cool, dry and intact. No rash noted.    ____________________________________________  LABS (all labs ordered are listed, but only abnormal results are displayed)  Labs Reviewed  URINALYSIS COMPLETEWITH MICROSCOPIC ([facility] ONLY) - Abnormal; Notable for the following:    Color, Urine YELLOW (*)    APPearance CLEAR (*)    Glucose, UA >500 (*)    Protein, ur 30 (*)    Squamous Epithelial / LPF 0-5 (*)    All other components within normal limits  ETHANOL - Abnormal; Notable for the following:    Alcohol, Ethyl (B) 131 (*)    All other components within normal limits  CBC WITH DIFFERENTIAL/PLATELET - Abnormal; Notable for the following:    RDW 14.8 (*)     All other components within normal limits  COMPREHENSIVE METABOLIC PANEL - Abnormal; Notable for the following:    CO2 19 (*)    Glucose, Bld 339 (*)    Calcium 8.2 (*)    Anion gap 17 (*)    All other components within normal limits  MAGNESIUM - Abnormal; Notable for the following:    Magnesium 2.7 (*)    All other components within normal limits  URINE DRUG SCREEN, QUALITATIVE ([facility] ONLY) - Abnormal; Notable for the following:    Tricyclic, Ur Screen POSITIVE (*)    Amphetamines, Ur Screen POSITIVE (*)    Cocaine Metabolite,Ur [place] POSITIVE (*)    Cannabinoid 50 Ng, Ur [place] POSITIVE (*)    All other components within normal limits  ACETAMINOPHEN LEVEL - Abnormal; Notable for the following:    Acetaminophen (Tylenol), Serum <10 (*)    All other components within normal limits  OSMOLALITY - Abnormal; Notable for the following:    Osmolality 337 (*)    All other components within normal limits  BLOOD GAS, ARTERIAL - Abnormal; Notable for the following:    pH, Arterial 7.10 (*)    pO2, Arterial 78 (*)    Bicarbonate 14.9 (*)    Acid-base deficit 14.6 (*)    [place] test (pass/fail) POSITIVE (*)    All other components within normal limits  CULTURE, BLOOD (ROUTINE X 2)  CULTURE, BLOOD (ROUTINE X 2)  TROPONIN I  SALICYLATE LEVEL   ____________________________________________  EKG  ED ECG REPORT I, Favian Kittleson, the attending physician, personally viewed and interpreted this ECG.  Date: 01/21/2016 EKG Time: 03:19 Rate: 104 Rhythm: Borderline tachycardia QRS Axis: normal Intervals: normal ST/T Wave abnormalities: normal Conduction Disutrbances: none Narrative Interpretation: unremarkable  ____________________________________________  RADIOLOGY   Ct Head Wo Contrast  01/21/2016  CLINICAL DATA:  Intubated patient. Unresponsive. Possible overdose. Uncertain if trauma. EXAM: CT HEAD WITHOUT CONTRAST TECHNIQUE: Contiguous axial images were obtained from the base of the skull  through the vertex without intravenous contrast. COMPARISON:  None. FINDINGS: Ventricles and sulci appear symmetrical. No ventricular dilatation. No mass effect or midline shift. No abnormal extra-axial fluid collections. Gray-white matter junctions are distinct. Basal cisterns are not effaced. No evidence of acute intracranial hemorrhage. No depressed skull fractures. Visualized paranasal sinuses and mastoid air cells are not opacified. Vascular calcifications. Old appearing nasal bone fractures. IMPRESSION: No acute intracranial abnormalities. Electronically Signed   By: Burman Nieves M.D.   On: 01/21/2016 03:45   Dg Chest Portable 1 View  01/21/2016  CLINICAL DATA:  Unresponsive patient.  Intubation. EXAM: PORTABLE CHEST 1 VIEW COMPARISON:  11/09/2015 FINDINGS: Endotracheal tube placed with tip measuring 4.8 cm above the carina. Enteric tube placed with tip in the left upper quadrant consistent with location in the upper stomach. Heart size is normal.  There is new bilateral perihilar airspace disease suggesting edema or bilateral pneumonia. Right lung nodules likely representing calcified granulomas. No blunting of costophrenic angles. No pneumothorax. Mediastinal contours appear intact. IMPRESSION: Appliances appear in satisfactory location. New development of bilateral perihilar airspace disease suggesting pneumonia or edema. Electronically Signed   By: Burman Nieves M.D.   On: 01/21/2016 03:35   Dg Abd Portable 1v  01/21/2016  CLINICAL DATA:  Patient unresponsive. Orogastric tube placement. Initial encounter. EXAM: PORTABLE ABDOMEN - 1 VIEW COMPARISON:  None. FINDINGS: The patient's enteric tube is noted coiling at the fundus of the stomach. The visualized bowel gas pattern is unremarkable. Scattered air and stool filled loops of colon are seen; no abnormal dilatation of small bowel loops is seen to suggest small bowel obstruction. No free intra-abdominal air is identified, though evaluation for  free air is limited on a single supine view. The visualized osseous structures are within normal limits; the sacroiliac joints are unremarkable in appearance. The visualized lung bases are essentially clear. IMPRESSION: Enteric tube noted coiling at the fundus of the stomach. Electronically Signed   By: Roanna Raider M.D.   On: 01/21/2016 03:35    ____________________________________________   PROCEDURES  Procedure(s) performed: intubation, see procedure note(s).  INTUBATION Performed by: Loleta Rose  Required items: required blood products, implants, devices, and special equipment available Patient identity confirmed: provided demographic data and hospital-assigned identification number Time out: Immediately prior to procedure a "time out" was called to verify the correct patient, procedure, equipment, support staff and site/side marked as required.  Indications: Altered mental status, combative, overdose, cardiopulmonary arrest in the field, airway protection   Intubation method: Glidescope Laryngoscopy   Preoxygenation: BVM PTA,   Sedatives: Etomidate 20 mg IV Paralytic: rocuronium 100 mg IV  Tube Size: 7.5 cuffed  Post-procedure assessment: chest rise and ETCO2 monitor Breath sounds: equal and absent over the epigastrium Tube secured with: ETT holder Chest x-ray interpreted by radiologist and me.  Chest x-ray findings: endotracheal tube in appropriate position  Patient tolerated the procedure well with no immediate complications.     Critical Care performed: Yes, see critical care note(s)   CRITICAL CARE Performed by: Loleta Rose   Total critical care time: 75 minutes  Critical care time was exclusive of separately billable procedures and treating other patients.  Critical care was necessary to treat or prevent imminent or life-threatening deterioration.  Critical care was time spent personally by me on the following activities: development of treatment  plan with patient and/or surrogate as well as nursing, discussions with consultants, evaluation of patient's response to treatment, examination of patient, obtaining history from patient or surrogate, ordering and performing treatments and interventions, ordering and review of laboratory studies, ordering and review of radiographic studies, pulse oximetry and re-evaluation of patient's condition.  ____________________________________________   INITIAL IMPRESSION / ASSESSMENT AND PLAN / ED COURSE  Pertinent labs & imaging results that were available during my care of the patient were reviewed by me and considered in my medical decision making (see chart for details).  The patient is acutely altered and combative, has a GCS of 8, had a full cardiopulmonary arrest in the field, and likely has accidentally overdosed on multiple substances.  Other people were present in the motel room and did not provide any useful information about substances but based on his response to Narcan it is likely some form of narcotic.  It is also likely he will again decompensate after the Narcan wears off.  Given  our inability to keep the patient safe and thoroughly evaluate him including a head CT, I elected to intubate him to protect his airway and breathing while continuing his emergency workup and evaluation.  Will reassess after labs, imaging.  ----------------------------------------- 4:21 AM on 01/21/2016 -----------------------------------------  ABG shows acidosis, hypoxia.  RR increased and FiO2 increased.  Questionable bilateral pneumonia on CXR.  Given full cardiopulmonary arrest and CPR, multiple manipulations of airway, and question CXR findings, giving empiric dose of Unasyn 3 g IV which includes coverage for aspiration PNA.  Keeping patient intubated for safety and continued treatment.  No bed currently available in ICU, but will be available at 7:00am.  Admitting for further management.  Giving 2nd L NS  bolus.  ----------------------------------------- 4:32 AM on 01/21/2016 -----------------------------------------  I was called to the room because the patient was bucking the vent.  I authorized a propofol 60 mg IV bolus to calm him down.  We then increased the rate of propofol infusion from 15 mcg/kg per hour to 30 mcg/kg per hour.  After the bolus the patient is slightly hypotensive at 87/62 but the map is still approximately 71 and this is the after effects of the bolus.  We will continue to monitor carefully with frequent blood pressure rechecks and a second liter bolus.  I will consider a different agent such as Versed infusion if the patient continues to have sedation issues.  ----------------------------------------- 4:41 AM on 01/21/2016 -----------------------------------------  In spite of being hypotensive after his propofol bolus, the patient continues to shake his head side to side although he is not responding to voice, commands, or physical stimulation.  I will switch agents from propofol to Versed and fentanyl, particularly given his alcohol intoxication and likely chronic alcohol use; this may be more effective.  I authorized a Versed bolus of 4 mg IV, rocuronium 100 mg IV to help protect his airway, and I ordered a Versed infusion of 5 mg/hr and a fentanyl infusion of 150 g per hour.  The patient's core temperature is 93 degrees now, up from 91 initially.  He is still underneath a Lawyer.  ----------------------------------------- 5:26 AM on 01/21/2016 -----------------------------------------  Osmols elevated, osmolar gap is zero, so osmols appropriately elevated for ethanol intoxication.  Patient adequately sedated at this time.  ____________________________________________  FINAL CLINICAL IMPRESSION(S) / ED DIAGNOSES  Final diagnoses:  Acute respiratory failure with hypoxia (HCC)  Altered mental status, unspecified altered mental status type  Polysubstance  abuse  Overdose, undetermined intent, initial encounter  Hyperglycemia  Hypothermia, initial encounter    Loleta Rose, MD 01/21/16 0423  Loleta Rose, MD 01/21/16 [phone]

## 2016-01-21 NOTE — ED Notes (Addendum)
Propofol was initiated at 0309 at 19mcg/kg/min so 2.11ml/hr (would not let me chart with scanning- saying weight is not charted)

## 2016-01-21 NOTE — ED Notes (Signed)
Report given to Amber, RN 

## 2016-01-21 NOTE — Progress Notes (Signed)
PHARMACY - CRITICAL CARE PROGRESS NOTE  Pharmacy Consult for Unasyn (1/7), Constipation Prevention, Electrolyte Replacement and Glucose Management     No Known Allergies  Patient Measurements: Height:  (172.7 cm) Weight: 153 lb (69.4 kg) IBW/kg (Calculated) : 68.4   Vital Signs: Temp: 100 F (37.8 C) (01/27 1400) Temp Source: Other (Comment) (01/27 0840) BP: 87/55 mmHg (01/27 1400) Pulse Rate: 99 (01/27 1400) Intake/Output from previous day: 01/26 0701 - 01/27 0700 In: -  Out: 1700 [Urine:1700] Intake/Output from this shift: Total I/O In: 939 [I.V.:939] Out: 250 [Urine:250] Vent settings for last 24 hours: Vent Mode:  [-] PRVC FiO2 (%):  [30 %-50 %] 30 % Set Rate:  [16 bmp-18 bmp] 18 bmp Vt Set:  [450 mL] 450 mL PEEP:  [5 cmH20] 5 cmH20 Plateau Pressure:  [14 cmH20-16 cmH20] 14 cmH20  Labs:  Recent Labs  01/21/16 0257 01/21/16 1348  WBC 9.0 10.4  HGB 14.1 12.4*  HCT 42.6 36.9*  PLT 252 211  CREATININE 1.21  --   MG 2.7*  --   ALBUMIN 4.1  --   PROT 7.1  --   AST 38  --   ALT 37  --   ALKPHOS 113  --   BILITOT 0.8  --    Estimated Creatinine Clearance: 67.5 mL/min (by C-G formula based on Cr of 1.21).  No results for input(s): GLUCAP in the last 72 hours.  Microbiology: Recent Results (from the past 720 hour(s))  MRSA PCR Screening     Status: None   Collection Time: 01/21/16  8:51 AM  Result Value Ref Range Status   MRSA by PCR NEGATIVE NEGATIVE Final    Comment:        The GeneXpert MRSA Assay (FDA approved for NASAL specimens only), is one component of a comprehensive MRSA colonization surveillance program. It is not intended to diagnose MRSA infection nor to guide or monitor treatment for MRSA infections.     Medications:  Scheduled:  . ampicillin-sulbactam (UNASYN) IV  1.5 g Intravenous Q6H  . antiseptic oral rinse  7 mL Mouth Rinse QID  . calcium gluconate      . chlorhexidine gluconate  15 mL Mouth Rinse BID  . folic acid 1  mg in sodium chloride 0.9% 50 mL PB   Intravenous Daily  . heparin  5,000 Units Subcutaneous 3 times per day  . insulin aspart  2-6 Units Subcutaneous 6 times per day  . pantoprazole (PROTONIX) IV  40 mg Intravenous Q24H  . sodium chloride flush  3 mL Intravenous Q12H  . [START ON 01/22/2016] thiamine IV  100 mg Intravenous Daily   Infusions:  . sodium chloride 125 mL/hr at 01/21/16 0912  . fentaNYL infusion INTRAVENOUS 175 mcg/hr (01/21/16 1300)  . midazolam (VERSED) infusion 2 mg/hr (01/21/16 0653)  . propofol (DIPRIVAN) infusion      Assessment: Pharmacy consulted for medication management for 55 yo male ICU patient requiring mechanical ventilation.    Plan:  1. Unasyn (1/7): Will continue patient on Unasyn 3g IV Q6hr to cover possible aspiration PNA.     2. Constipation: if patient is still intubated on 1/28, will add senna/docusate 1 tab VT BID.    3. Electrolytes: electrolytes are within normal limits. Will obtain follow-up electrolytes with am labs.    4. Glucose Management: Initial glucose reading > 300. Will initiate phase 1 of hyperglycemia protocol with SSI Q4hr. Anticipate patient will need insulin drip.    Pharmacy will continue to  monitor and adjust per consult.    Kiaya Haliburton L 01/21/2016,3:53 PM

## 2016-01-21 NOTE — Progress Notes (Signed)
[facility] [place] at [facility]   PATIENT NAME: Duane Hayes    MR#:  [id number]  DATE OF BIRTH:  01-Apr-1961  SUBJECTIVE:admitted for AMS?respiratory failure//possible drug overdose; Hypothermia/bradycardia on arrival.now on sedation but  Follows some commands. 55 yo male found unresponsive in the field, received narcan, had CPR for 2 minutes, intubated, transferred to ICU for further management and care. Urine tox positive for tricyclics, cocaine, amphetamines; alcohol level greater than 10  CHIEF COMPLAINT:   Chief Complaint  Patient presents with  . Drug Overdose    REVIEW OF SYSTEMS:   Review of Systems  Unable to perform ROS: intubated   CONSTITUTIONAL: No fever, fatigue or weakness.  EYES: No blurred or double vision.  EARS, NOSE, AND THROAT: No tinnitus or ear pain.  RESPIRATORY: No cough, shortness of breath, wheezing or hemoptysis.  CARDIOVASCULAR: No chest pain, orthopnea, edema.  GASTROINTESTINAL: No nausea, vomiting, diarrhea or abdominal pain.  GENITOURINARY: No dysuria, hematuria.  ENDOCRINE: No polyuria, nocturia,  HEMATOLOGY: No anemia, easy bruising or bleeding SKIN: No rash or lesion. MUSCULOSKELETAL: No joint pain or arthritis.   NEUROLOGIC: No tingling, numbness, weakness.  PSYCHIATRY: No anxiety or depression.   DRUG ALLERGIES:  No Known Allergies  VITALS:  Blood pressure 87/55, pulse 99, temperature 100 F (37.8 C), temperature source Other (Comment), resp. rate 18, height  (1.727 m), weight 69.4 kg (153 lb), SpO2 97 %.  PHYSICAL EXAMINATION:  GENERAL:  55 y.o.-year-old patient lying in the bed ,critically ill,intubated/ EYES: Pupils equal, round, reactive to light , No scleral icterus. Extraocular muscles intact.  HEENT: Head atraumatic, normocephalic. Orally intubated. NECK:  Supple, no jugular venous distention. No thyroid enlargement, no tenderness.  LUNGS: Normal breath sounds bilaterally, no wheezing,  rales,rhonchi or crepitation. No use of accessory muscles of respiration.  CARDIOVASCULAR: S1, S2 normal. No murmurs, rubs, or gallops.  ABDOMEN: Soft, nontender, nondistended. Bowel sounds present. No organomegaly or mass.  EXTREMITIES: No pedal edema, cyanosis, or clubbing.  NEUROLOGIC: unable to do full neuro exam due to intubation and sedation. PSYCHIATRIC: intubated/sedated. SKIN: No obvious rash, lesion, or ulcer.    LABORATORY PANEL:   CBC  Recent Labs Lab 01/21/16 1348  WBC 10.4  HGB 12.4*  HCT 36.9*  PLT 211   ------------------------------------------------------------------------------------------------------------------  Chemistries   Recent Labs Lab 01/21/16 0257  NA 139  K 3.6  CL 103  CO2 19*  GLUCOSE 339*  BUN 12  CREATININE 1.21  CALCIUM 8.2*  MG 2.7*  AST 38  ALT 37  ALKPHOS 113  BILITOT 0.8   ------------------------------------------------------------------------------------------------------------------  Cardiac Enzymes  Recent Labs Lab 01/21/16 0257  TROPONINI <0.03   ------------------------------------------------------------------------------------------------------------------  RADIOLOGY:  Ct Head Wo Contrast  01/21/2016  CLINICAL DATA:  Intubated patient. Unresponsive. Possible overdose. Uncertain if trauma. EXAM: CT HEAD WITHOUT CONTRAST TECHNIQUE: Contiguous axial images were obtained from the base of the skull through the vertex without intravenous contrast. COMPARISON:  None. FINDINGS: Ventricles and sulci appear symmetrical. No ventricular dilatation. No mass effect or midline shift. No abnormal extra-axial fluid collections. Gray-white matter junctions are distinct. Basal cisterns are not effaced. No evidence of acute intracranial hemorrhage. No depressed skull fractures. Visualized paranasal sinuses and mastoid air cells are not opacified. Vascular calcifications. Old appearing nasal bone fractures. IMPRESSION: No acute  intracranial abnormalities. Electronically Signed   By: Burman Nieves M.D.   On: 01/21/2016 03:45   Dg Chest Portable 1 View  01/21/2016  CLINICAL DATA:  Unresponsive patient.  Intubation. EXAM: PORTABLE CHEST 1 VIEW COMPARISON:  11/09/2015 FINDINGS: Endotracheal tube placed with tip measuring 4.8 cm above the carina. Enteric tube placed with tip in the left upper quadrant consistent with location in the upper stomach. Heart size is normal. There is new bilateral perihilar airspace disease suggesting edema or bilateral pneumonia. Right lung nodules likely representing calcified granulomas. No blunting of costophrenic angles. No pneumothorax. Mediastinal contours appear intact. IMPRESSION: Appliances appear in satisfactory location. New development of bilateral perihilar airspace disease suggesting pneumonia or edema. Electronically Signed   By: Burman Nieves M.D.   On: 01/21/2016 03:35   Dg Abd Portable 1v  01/21/2016  CLINICAL DATA:  Patient unresponsive. Orogastric tube placement. Initial encounter. EXAM: PORTABLE ABDOMEN - 1 VIEW COMPARISON:  None. FINDINGS: The patient's enteric tube is noted coiling at the fundus of the stomach. The visualized bowel gas pattern is unremarkable. Scattered air and stool filled loops of colon are seen; no abnormal dilatation of small bowel loops is seen to suggest small bowel obstruction. No free intra-abdominal air is identified, though evaluation for free air is limited on a single supine view. The visualized osseous structures are within normal limits; the sacroiliac joints are unremarkable in appearance. The visualized lung bases are essentially clear. IMPRESSION: Enteric tube noted coiling at the fundus of the stomach. Electronically Signed   By: Roanna Raider M.D.   On: 01/21/2016 03:35    EKG:   Orders placed or performed during the hospital encounter of 01/21/16  . ED EKG  . ED EKG  . EKG 12-Lead  . EKG 12-Lead    ASSESSMENT AND PLAN:   1.acute  respiratory failure ;due to Drug Overdose and alcohol intoxication;continue full  Vent support today,SBT trial tomorrow as per DR.Mungal. continue abx.continue IV Unasyn for possible aspiration  2.cardiac arrest with hypothermia/very brief CPR;now hypothermia and bradycardia are resolved, 3.metabolic acidosis ;improved with  Bicarb given in Er and IV  Fluids 4.possible suicidal attempt;according to sister ,pt is talking about suicidal iseation recently;needs psych consult after extubation and sittter 5.hypotension;likley due to sedation;continue iv lfuids 6.h/o ETOH abuse;continue Thiamine and folic acid   All the records are reviewed and case discussed with Care Management/Social Workerr. Management plans discussed with the patient, family and they are in agreement.  CODE STATUS: full  TOTAL TIME TAKING CARE OF THIS PATIENT: 35  minutes. CCT  POSSIBLE D/C IN  2-3DAYS, DEPENDING ON CLINICAL CONDITION.   Katha Hamming M.D on 01/21/2016 at 4:01 PM  Between 7am to 6pm - Pager - [phone]  After 6pm go to [email or web address] - password [email or web address] [facility]  [place] [facility]  Office  [phone]  CC: Primary care physician; No PCP Per Patient   Note: This dictation was prepared with Dragon dictation along with smaller phrase technology. Any transcriptional errors that result from this process are unintentional.

## 2016-01-21 NOTE — ED Notes (Addendum)
2 family members escorted by officer at bedside.

## 2016-01-21 NOTE — Progress Notes (Signed)
Nutrition Follow-up    INTERVENTION:   Coordination of Care: poc discussed during ICU rounds; per MD Mungal, possible extubation later today or tomorrow. Due to current poc, recommend holding on initiation of TF. If unable to extubate within 24 hours, recommend starting. Continue to assess   NUTRITION DIAGNOSIS:   Inadequate oral intake related to acute illness as evidenced by NPO status.  GOAL:   Provide needs based on ASPEN/SCCM guidelines  MONITOR:    (Energy Intake, Anthropometrics, Electrolyte/Renal Profile, Digestive System, Pulmonary)  REASON FOR ASSESSMENT:   Ventilator    ASSESSMENT:    Pt admitted after being found unresponsive, acute respiratory failure requiring intubation, metabolic acidosis; toxicology positive for cannabis, amphetamines and tricyclics, also positive for EtOH. Current  No past medical history on file.  Diet Order:   NPO  Digestive System: OG coiled in stomach  Electrolyte and Renal Profile:  Recent Labs Lab 01/21/16 0257  BUN 12  CREATININE 1.21  NA 139  K 3.6  MG 2.7*   Glucose Profile: No results for input(s): GLUCAP in the last 72 hours. Meds: NS at 125 ml/hr  Height:   Ht Readings from Last 1 Encounters:  01/21/16  (1.727 m)    Weight:   Wt Readings from Last 1 Encounters:  01/21/16 153 lb (69.4 kg)    Wt Readings from Last 10 Encounters:  01/21/16 153 lb (69.4 kg)  11/09/15 150 lb (68.04 kg)    BMI:  Body mass index is 23.27 kg/(m^2).  Estimated Nutritional Needs:   Kcal:  reassess on follow-up  Protein:  83-138 g (1.2-2.0 g/kg)   Fluid:  1725-2070 ml (25-30 ml/kg)      HIGH Care Level  Romelle Starcher MS, RD, LDN [phone] Pager  [phone] Weekend/On-Call Pager

## 2016-01-21 NOTE — ED Notes (Signed)
Pt presents via [facility] with unknown overdose. Pt was found in the floor of a room at [facility] covered in ice with agonal breathing. En route to [facility], EMS lost pulses, began CPR for 2 minutes. Pt was intubated during CPR. Pt was intubated for 10-15 minutes per EMS, then extubated because pt had strong gag reflex. Pt received  intranasal narcan,  IV narcan en route. On arrival, pt is mumbling and squirming in bed. Per EMS, the people in the room with the pt at [facility] believe pt took either Heroin or Fentanyl. ETOH on board.

## 2016-01-21 NOTE — ED Notes (Signed)
Pt has increased the amount of cursing at staff.

## 2016-01-21 NOTE — ED Notes (Signed)
Pt is cursing at staff. When asked if pt lives at the address stated in chart, pt is able to answer. Pt is able to repeat what MD York Cerise says to him.

## 2016-01-21 NOTE — ED Notes (Signed)
MD Bynum Bellows RN, Phyllis Ginger, Ally RN, Christine RN, Joe RT, Raynelle Fanning tech, [employer], Systems analyst at bedside

## 2016-01-21 NOTE — ED Notes (Signed)
Pt began to cough and stated "I'm getting sick" Pt's head was turned to side and suction was provided.

## 2016-01-21 NOTE — H&P (Signed)
Duane Hayes is an 55 y.o. male.   Chief Complaint: Drug overdose HPI: The patient presents the emergency department via EMS after being found unconscious in a motel room. The patient was found with ice all over his body. Initially the patient's pulse was about 20 bpm and respirations were shallow. The patient was given Narcan and became very agitated. During an attempt to subdue the patient in order to supply supplemental oxygen he lost a pulse. CPR was started and achieved return of circulation. The patient was intubated continue to thrash. EMS personnel took this to mean that the patient may protect his airway and subsequently extubated him. However upon arrival in the emergency department the patient was not protecting his airway which prompted reintubation. He was placed on mechanical until relation and the hospitalist service was called for admission.  No past medical history on file. unavailable as patient is intubated and sedated  No past surgical history on file.  unavailable as patient is intubated and sedated  No family history on file.  unavailable as patient is intubated and sedated Social History:  reports that he has never smoked. He does not have any smokeless tobacco history on file. He reports that he drinks alcohol. He reports that he does not use illicit drugs.  Allergies: No Known Allergies  Prior to Admission medications   Not on File  Unavailable   Results for orders placed or performed during the hospital encounter of 01/21/16 (from the past 48 hour(s))  Urinalysis complete, with microscopic ([facility] only)     Status: Abnormal   Collection Time: 01/21/16  2:57 AM  Result Value Ref Range   Color, Urine YELLOW (A) YELLOW   APPearance CLEAR (A) CLEAR   Glucose, UA >500 (A) NEGATIVE mg/dL   Bilirubin Urine NEGATIVE NEGATIVE   Ketones, ur NEGATIVE NEGATIVE mg/dL   Specific Gravity, Urine 1.011 1.005 - 1.030   Hgb urine dipstick NEGATIVE NEGATIVE   pH 6.0 5.0 - 8.0    Protein, ur 30 (A) NEGATIVE mg/dL   Nitrite NEGATIVE NEGATIVE   Leukocytes, UA NEGATIVE NEGATIVE   RBC / HPF 0-5 0 - 5 RBC/hpf   WBC, UA 0-5 0 - 5 WBC/hpf   Bacteria, UA NONE SEEN NONE SEEN   Squamous Epithelial / LPF 0-5 (A) NONE SEEN   Mucous PRESENT    Hyaline Casts, UA PRESENT   Ethanol     Status: Abnormal   Collection Time: 01/21/16  2:57 AM  Result Value Ref Range   Alcohol, Ethyl (B) 131 (H) <5 mg/dL    Comment:        LOWEST DETECTABLE LIMIT FOR SERUM ALCOHOL IS 5 mg/dL FOR MEDICAL PURPOSES ONLY   CBC with Differential/Platelet     Status: Abnormal   Collection Time: 01/21/16  2:57 AM  Result Value Ref Range   WBC 9.0 3.8 - 10.6 K/uL   RBC 4.42 4.40 - 5.90 MIL/uL   Hemoglobin 14.1 13.0 - 18.0 g/dL   HCT 42.6 40.0 - 52.0 %   MCV 96.3 80.0 - 100.0 fL   MCH 31.8 26.0 - 34.0 pg   MCHC 33.1 32.0 - 36.0 g/dL   RDW 14.8 (H) 11.5 - 14.5 %   Platelets 252 150 - 440 K/uL   Neutrophils Relative % 53 %   Neutro Abs 4.8 1.4 - 6.5 K/uL   Lymphocytes Relative 38 %   Lymphs Abs 3.4 1.0 - 3.6 K/uL   Monocytes Relative 6 %   Monocytes Absolute  0.5 0.2 - 1.0 K/uL   Eosinophils Relative 2 %   Eosinophils Absolute 0.2 0 - 0.7 K/uL   Basophils Relative 1 %   Basophils Absolute 0.1 0 - 0.1 K/uL  Troponin I     Status: None   Collection Time: 01/21/16  2:57 AM  Result Value Ref Range   Troponin I <0.03 <0.031 ng/mL    Comment:        NO INDICATION OF MYOCARDIAL INJURY.   Comprehensive metabolic panel     Status: Abnormal   Collection Time: 01/21/16  2:57 AM  Result Value Ref Range   Sodium 139 135 - 145 mmol/L   Potassium 3.6 3.5 - 5.1 mmol/L   Chloride 103 101 - 111 mmol/L   CO2 19 (L) 22 - 32 mmol/L   Glucose, Bld 339 (H) 65 - 99 mg/dL   BUN 12 6 - 20 mg/dL   Creatinine, Ser 1.21 0.61 - 1.24 mg/dL   Calcium 8.2 (L) 8.9 - 10.3 mg/dL   Total Protein 7.1 6.5 - 8.1 g/dL   Albumin 4.1 3.5 - 5.0 g/dL   AST 38 15 - 41 U/L   ALT 37 17 - 63 U/L   Alkaline Phosphatase 113 38 -  126 U/L   Total Bilirubin 0.8 0.3 - 1.2 mg/dL   GFR calc non Af Amer >60 >60 mL/min   GFR calc Af Amer >60 >60 mL/min    Comment: (NOTE) The eGFR has been calculated using the CKD EPI equation. This calculation has not been validated in all clinical situations. eGFR's persistently <60 mL/min signify possible Chronic Kidney Disease.    Anion gap 17 (H) 5 - 15  Magnesium     Status: Abnormal   Collection Time: 01/21/16  2:57 AM  Result Value Ref Range   Magnesium 2.7 (H) 1.7 - 2.4 mg/dL  Urine Drug Screen, Qualitative ([facility] only)     Status: Abnormal   Collection Time: 01/21/16  2:57 AM  Result Value Ref Range   Tricyclic, Ur Screen POSITIVE (A) NONE DETECTED   Amphetamines, Ur Screen POSITIVE (A) NONE DETECTED   MDMA (Ecstasy)Ur Screen NONE DETECTED NONE DETECTED   Cocaine Metabolite,Ur [place] POSITIVE (A) NONE DETECTED   Opiate, Ur Screen NONE DETECTED NONE DETECTED   Phencyclidine (PCP) Ur S NONE DETECTED NONE DETECTED   Cannabinoid 50 Ng, Ur [place] POSITIVE (A) NONE DETECTED   Barbiturates, Ur Screen NONE DETECTED NONE DETECTED   Benzodiazepine, Ur Scrn NONE DETECTED NONE DETECTED   Methadone Scn, Ur NONE DETECTED NONE DETECTED    Comment: (NOTE) 570  Tricyclics, urine               Cutoff 1000 ng/mL 200  Amphetamines, urine             Cutoff 1000 ng/mL 300  MDMA (Ecstasy), urine           Cutoff 500 ng/mL 400  Cocaine Metabolite, urine       Cutoff 300 ng/mL 500  Opiate, urine                   Cutoff 300 ng/mL 600  Phencyclidine (PCP), urine      Cutoff 25 ng/mL 700  Cannabinoid, urine              Cutoff 50 ng/mL 800  Barbiturates, urine             Cutoff 200 ng/mL 900  Benzodiazepine, urine  Cutoff 200 ng/mL 1000 Methadone, urine                Cutoff 300 ng/mL 1100 1200 The urine drug screen provides only a preliminary, unconfirmed 1300 analytical test result and should not be used for non-medical 1400 purposes. Clinical consideration and professional judgment  should 1500 be applied to any positive drug screen result due to possible 1600 interfering substances. A more specific alternate chemical method 1700 must be used in order to obtain a confirmed analytical result.  1800 Gas chromato graphy / mass spectrometry (GC/MS) is the preferred 1900 confirmatory method.   Salicylate level     Status: None   Collection Time: 01/21/16  2:57 AM  Result Value Ref Range   Salicylate Lvl <2.9 2.8 - 30.0 mg/dL  Acetaminophen level     Status: Abnormal   Collection Time: 01/21/16  2:57 AM  Result Value Ref Range   Acetaminophen (Tylenol), Serum <10 (L) 10 - 30 ug/mL    Comment:        THERAPEUTIC CONCENTRATIONS VARY SIGNIFICANTLY. A RANGE OF 10-30 ug/mL MAY BE AN EFFECTIVE CONCENTRATION FOR MANY PATIENTS. HOWEVER, SOME ARE BEST TREATED AT CONCENTRATIONS OUTSIDE THIS RANGE. ACETAMINOPHEN CONCENTRATIONS >150 ug/mL AT 4 HOURS AFTER INGESTION AND >50 ug/mL AT 12 HOURS AFTER INGESTION ARE OFTEN ASSOCIATED WITH TOXIC REACTIONS.   Osmolality     Status: Abnormal   Collection Time: 01/21/16  2:57 AM  Result Value Ref Range   Osmolality 337 (HH) 275 - 295 mOsm/kg    Comment: CRITICAL RESULT CALLED TO, READ BACK BY AND VERIFIED WITH: KATE BUMGARNER AT 0425 01/21/16 BY VAB   Blood gas, arterial     Status: Abnormal   Collection Time: 01/21/16  3:55 AM  Result Value Ref Range   FIO2 0.40    Delivery systems VENTILATOR    Mode ASSIST CONTROL    VT 450 mL   LHR 16 resp/min   Peep/cpap 5.0 cm H20   pH, Arterial 7.10 (LL) 7.350 - 7.450    Comment: CRITICAL RESULT CALLED TO, READ BACK BY AND VERIFIED WITH: HAND DELIVERED CRITICAL VALUE PH7.10 DR. [facility] 01/21/16 0405 JH    pCO2 arterial 48 32.0 - 48.0 mmHg   pO2, Arterial 78 (L) 83.0 - 108.0 mmHg   Bicarbonate 14.9 (L) 21.0 - 28.0 mEq/L   Acid-base deficit 14.6 (H) 0.0 - 2.0 mmol/L   O2 Saturation 89.4 %   Patient temperature 37.0    Collection site RIGHT RADIAL    Sample type ARTERIAL DRAW     Allens test (pass/fail) POSITIVE (A) PASS   Mechanical Rate 16    Ct Head Wo Contrast  01/21/2016  CLINICAL DATA:  Intubated patient. Unresponsive. Possible overdose. Uncertain if trauma. EXAM: CT HEAD WITHOUT CONTRAST TECHNIQUE: Contiguous axial images were obtained from the base of the skull through the vertex without intravenous contrast. COMPARISON:  None. FINDINGS: Ventricles and sulci appear symmetrical. No ventricular dilatation. No mass effect or midline shift. No abnormal extra-axial fluid collections. Gray-white matter junctions are distinct. Basal cisterns are not effaced. No evidence of acute intracranial hemorrhage. No depressed skull fractures. Visualized paranasal sinuses and mastoid air cells are not opacified. Vascular calcifications. Old appearing nasal bone fractures. IMPRESSION: No acute intracranial abnormalities. Electronically Signed   By: Lucienne Capers M.D.   On: 01/21/2016 03:45   Dg Chest Portable 1 View  01/21/2016  CLINICAL DATA:  Unresponsive patient.  Intubation. EXAM: PORTABLE CHEST 1 VIEW COMPARISON:  11/09/2015 FINDINGS: Endotracheal  tube placed with tip measuring 4.8 cm above the carina. Enteric tube placed with tip in the left upper quadrant consistent with location in the upper stomach. Heart size is normal. There is new bilateral perihilar airspace disease suggesting edema or bilateral pneumonia. Right lung nodules likely representing calcified granulomas. No blunting of costophrenic angles. No pneumothorax. Mediastinal contours appear intact. IMPRESSION: Appliances appear in satisfactory location. New development of bilateral perihilar airspace disease suggesting pneumonia or edema. Electronically Signed   By: Lucienne Capers M.D.   On: 01/21/2016 03:35   Dg Abd Portable 1v  01/21/2016  CLINICAL DATA:  Patient unresponsive. Orogastric tube placement. Initial encounter. EXAM: PORTABLE ABDOMEN - 1 VIEW COMPARISON:  None. FINDINGS: The patient's enteric tube is noted  coiling at the fundus of the stomach. The visualized bowel gas pattern is unremarkable. Scattered air and stool filled loops of colon are seen; no abnormal dilatation of small bowel loops is seen to suggest small bowel obstruction. No free intra-abdominal air is identified, though evaluation for free air is limited on a single supine view. The visualized osseous structures are within normal limits; the sacroiliac joints are unremarkable in appearance. The visualized lung bases are essentially clear. IMPRESSION: Enteric tube noted coiling at the fundus of the stomach. Electronically Signed   By: Garald Balding M.D.   On: 01/21/2016 03:35    Review of Systems  Unable to perform ROS: intubated    Blood pressure 95/70, pulse 97, temperature 94.3 F (34.6 C), resp. rate 22, weight 82 kg (180 lb 12.4 oz), SpO2 99 %. Physical Exam  Constitutional: He appears well-developed and well-nourished. He appears distressed. He is intubated.  Heating blanket in place  HENT:  Head: Normocephalic and atraumatic.  Mouth/Throat: Oropharynx is clear and moist.  Eyes: Conjunctivae and EOM are normal. Pupils are equal, round, and reactive to light. No scleral icterus.  Neck: Normal range of motion. Neck supple. No JVD present. No tracheal deviation present. No thyromegaly present.  Cardiovascular: Normal rate, regular rhythm and normal heart sounds.  Exam reveals no gallop and no friction rub.   No murmur heard. Respiratory: Breath sounds normal. He is intubated.  GI: Soft. Bowel sounds are normal. He exhibits no distension. There is no tenderness.  Genitourinary:  Deferred  Musculoskeletal: Normal range of motion. He exhibits no edema.  Lymphadenopathy:    He has no cervical adenopathy.  Neurological:  Patient is intubated and sedated  Skin: Skin is dry. No erythema.  Skin is cool  Psychiatric:  Cannot assess psychological state as the patient is unresponsive to everything but painful stimuli      Assessment/Plan This is a 55 year old Caucasian male admitted for acute respiratory failure with hypoxia. 1. Acute respiratory failure with hypoxia: Perhaps due to aspiration secondary to sedation from polysubstance abuse. The patient has received Narcan but cannot fully protect his airway. Thus, he remains mechanically ventilated with mild sedation. No evidence of pneumonia at this point. The patient has been given Unasyn due to data showing improved outcomes following CPR with antibiotics to cover respiratory flora. At this moment, he currently meets criteria for sepsis due to tachycardia and hypothermia. However, the latter may be secondary to environmental temperatures as the patient was covered and ice when found by EMS. Blood cultures have been obtained in the emergency department and the patient started on aggressive fluid resuscitation. Continue to monitor arterial blood gases to improve oxygenation. 2. Metabolic acidosis: pH is 7.1; I given the patient 2 ampules of sodium  bicarbonate. Continue hydrate. Telemetry looks normal at this time but if there is a concern for tricyclic overdose the patient will need dialysis to improve acidosis. 3. Drug abuse: The patient's urine toxicology screen is positive for cannabis, amphetamines, and tricyclics. Alcohol was also found in the patient's blood. He was given thiamine in the emergency department. We will stabilize electrolytes and continue to monitor. 4. DVT prophylaxis: Heparin 5. GI prophylaxis: None at this time. Initiate PPI once the patient has been intubated 24 hours. The patient is a full code. Time spent on admission orders and critical patient care proximally 45 minutes Harrie Foreman 01/21/2016, 5:46 AM

## 2016-01-21 NOTE — Progress Notes (Signed)
RN spoke with Dr. Belia Heman regarding patient's MAP of 65 and asked for MAP goal. MD gave order to keep patient's MAP greater than 60.

## 2016-01-21 NOTE — Progress Notes (Signed)
Resting comfortably. Arouses to voice easily.  Becomes restless and agitated with assessment and repositioning at times.  Sisters and other family have visited during shift. NSR-Stach per cardiac monitor. 600cc UOP.

## 2016-01-22 LAB — GLUCOSE, CAPILLARY
Glucose-Capillary: 66 mg/dL (ref 65–99)
Glucose-Capillary: 98 mg/dL (ref 65–99)
Glucose-Capillary: 67 mg/dL (ref 65–99)
Glucose-Capillary: 130 mg/dL — ABNORMAL HIGH (ref 65–99)
Glucose-Capillary: 67 mg/dL (ref 65–99)
Glucose-Capillary: 85 mg/dL (ref 65–99)
Glucose-Capillary: 94 mg/dL (ref 65–99)
Glucose-Capillary: 115 mg/dL — ABNORMAL HIGH (ref 65–99)

## 2016-01-22 LAB — COMPREHENSIVE METABOLIC PANEL
ALT: 26 U/L (ref 17–63)
ANION GAP: 3 — AB (ref 5–15)
AST: 24 U/L (ref 15–41)
Albumin: 2.8 g/dL — ABNORMAL LOW (ref 3.5–5.0)
Alkaline Phosphatase: 78 U/L (ref 38–126)
BUN: 16 mg/dL (ref 6–20)
CHLORIDE: 112 mmol/L — AB (ref 101–111)
CO2: 31 mmol/L (ref 22–32)
CREATININE: 0.97 mg/dL (ref 0.61–1.24)
Calcium: 8 mg/dL — ABNORMAL LOW (ref 8.9–10.3)
GFR calc non Af Amer: >60 mL/min (ref >60)
Glucose, Bld: 98 mg/dL (ref 65–99)
POTASSIUM: 3.5 mmol/L (ref 3.5–5.1)
SODIUM: 146 mmol/L — AB (ref 135–145)
Total Bilirubin: 0.8 mg/dL (ref 0.3–1.2)
Total Protein: 5.2 g/dL — ABNORMAL LOW (ref 6.5–8.1)

## 2016-01-22 LAB — CBC
HCT: 34.7 % — ABNORMAL LOW (ref 40.0–52.0)
Hemoglobin: 11.5 g/dL — ABNORMAL LOW (ref 13.0–18.0)
MCH: 31.8 pg (ref 26.0–34.0)
MCHC: 33.1 g/dL (ref 32.0–36.0)
MCV: 96.1 fL (ref 80.0–100.0)
PLATELETS: 163 10*3/uL (ref 150–440)
RBC: 3.61 MIL/uL — AB (ref 4.40–5.90)
RDW: 14.7 % — AB (ref 11.5–14.5)
WBC: 9.1 10*3/uL (ref 3.8–10.6)

## 2016-01-22 LAB — PHOSPHORUS: PHOSPHORUS: 2.7 mg/dL (ref 2.5–4.6)

## 2016-01-22 LAB — MAGNESIUM: Magnesium: 1.8 mg/dL (ref 1.7–2.4)

## 2016-01-22 MED ORDER — DEXTROSE 50 % IV SOLN
25.0000 mL | INTRAVENOUS | Status: AC
  Administered 2016-01-22: 25 mL via INTRAVENOUS

## 2016-01-22 MED ORDER — PNEUMOCOCCAL VAC POLYVALENT 25 MCG/0.5ML IJ INJ
0.5000 mL | INJECTION | INTRAMUSCULAR | Status: AC
  Administered 2016-01-22: 0.5 mL via INTRAMUSCULAR

## 2016-01-22 MED ORDER — FENTANYL 2500MCG IN NS 250ML (10MCG/ML) PREMIX INFUSION
25.0000 ug/h | INTRAVENOUS | Status: DC
  Administered 2016-01-22: 100 ug/h via INTRAVENOUS
  Administered 2016-01-23: 150 ug/h via INTRAVENOUS
  Administered 2016-01-23: 175 ug/h via INTRAVENOUS
  Administered 2016-01-24 – 2016-01-25 (×2): 150 ug/h via INTRAVENOUS
  Administered 2016-01-25 (×2): 300 ug/h via INTRAVENOUS
  Filled 2016-01-22 (×7): qty 250

## 2016-01-22 MED ORDER — DEXTROSE 50 % IV SOLN
50.0000 mL | INTRAVENOUS | Status: AC
  Administered 2016-01-22: 50 mL via INTRAVENOUS
  Filled 2016-01-22: qty 50

## 2016-01-22 MED ORDER — DOCUSATE SODIUM 50 MG/5ML PO LIQD
100.0000 mg | Freq: Two times a day (BID) | ORAL | Status: DC
  Administered 2016-01-22 – 2016-01-24 (×5): 100 mg
  Filled 2016-01-22 (×5): qty 10

## 2016-01-22 MED ORDER — SENNOSIDES-DOCUSATE SODIUM 8.6-50 MG PO TABS: 1.0000 | ORAL_TABLET | Freq: Two times a day (BID) | ORAL | Status: DC

## 2016-01-22 MED ORDER — FENTANYL BOLUS VIA INFUSION
50.0000 ug | INTRAVENOUS | Status: DC | PRN
  Administered 2016-01-25 – 2016-01-27 (×2): 50 ug via INTRAVENOUS
  Filled 2016-01-22: qty 50

## 2016-01-22 MED ORDER — FREE WATER
200.0000 mL | Freq: Three times a day (TID) | Status: DC
  Administered 2016-01-22 – 2016-01-26 (×12): 200 mL

## 2016-01-22 MED ORDER — DEXTROSE 50 % IV SOLN
INTRAVENOUS | Status: AC
  Administered 2016-01-22: 50 mL via INTRAVENOUS
  Filled 2016-01-22: qty 50

## 2016-01-22 MED ORDER — SENNOSIDES 8.8 MG/5ML PO SYRP
5.0000 mL | ORAL_SOLUTION | Freq: Two times a day (BID) | ORAL | Status: DC
  Administered 2016-01-22: 1 mL
  Administered 2016-01-22 – 2016-01-24 (×4): 5 mL
  Filled 2016-01-22 (×5): qty 5

## 2016-01-22 MED ORDER — DEXTROSE 50 % IV SOLN
50.0000 mL | INTRAVENOUS | Status: AC
  Administered 2016-01-22: 50 mL via INTRAVENOUS

## 2016-01-22 MED ORDER — VITAL HIGH PROTEIN PO LIQD
1000.0000 mL | ORAL | Status: DC
  Administered 2016-01-22: 21:00:00
  Administered 2016-01-22: 1000 mL
  Administered 2016-01-22: 20:00:00
  Administered 2016-01-22: 1000 mL
  Administered 2016-01-22 – 2016-01-23 (×10)

## 2016-01-22 MED ORDER — DEXTROSE 50 % IV SOLN
INTRAVENOUS | Status: AC
  Filled 2016-01-22: qty 50

## 2016-01-22 MED ORDER — DEXTROSE 5 % IV SOLN
INTRAVENOUS | Status: DC
  Administered 2016-01-22 – 2016-01-25 (×2): via INTRAVENOUS

## 2016-01-22 MED ORDER — PNEUMOCOCCAL VAC POLYVALENT 25 MCG/0.5ML IJ INJ: 0.5000 mL | INJECTION | INTRAMUSCULAR | Status: DC

## 2016-01-22 MED ORDER — PROPOFOL 1000 MG/100ML IV EMUL
0.0000 ug/kg/min | INTRAVENOUS | Status: AC
  Administered 2016-01-22 – 2016-01-23 (×2): 15 ug/kg/min via INTRAVENOUS
  Administered 2016-01-23: 30 ug/kg/min via INTRAVENOUS
  Administered 2016-01-24: 25 ug/kg/min via INTRAVENOUS
  Filled 2016-01-22 (×5): qty 100

## 2016-01-22 NOTE — Progress Notes (Signed)
°   01/22/16 1000  Clinical Encounter Type  Visited With Family;Health care provider  Visit Type Initial  Referral From Nurse  Consult/Referral To Chaplain  Spiritual Encounters  Spiritual Needs Other (Comment)  Stress Factors  Patient Stress Factors Not reviewed  Family Stress Factors Not reviewed  Patient sleeping.  Visited with family and offered support/comfort. Chaplain [employer] Ext [phone]

## 2016-01-22 NOTE — Progress Notes (Signed)
PHARMACY - CRITICAL CARE PROGRESS NOTE  Pharmacy Consult for Unasyn (1/7), Constipation Prevention, Electrolyte Replacement and Glucose Management     No Known Allergies  Patient Measurements: Height:  (172.7 cm) Weight: 153 lb 10.6 oz (69.7 kg) IBW/kg (Calculated) : 68.4   Vital Signs: Temp: 100.9 F (38.3 C) (01/28 1000) Temp Source: Core (Comment) (01/28 0800) BP: 91/56 mmHg (01/28 1000) Pulse Rate: 83 (01/28 1000) Intake/Output from previous day: 01/27 0701 - 01/28 0700 In: 3595.5 [I.V.:3395.5; IV Piggyback:200] Out: 600 [Urine:600] Intake/Output from this shift: Total I/O In: 576 [I.V.:476; IV Piggyback:100] Out: -  Vent settings for last 24 hours: Vent Mode:  [-] PRVC FiO2 (%):  [30 %-40 %] 30 % Set Rate:  [18 bmp] 18 bmp Vt Set:  [450 mL] 450 mL PEEP:  [5 cmH20] 5 cmH20 Plateau Pressure:  [14 cmH20-16 cmH20] 14 cmH20  Labs:  Recent Labs  01/21/16 0257 01/21/16 1348 01/22/16 0451  WBC 9.0 10.4 9.1  HGB 14.1 12.4* 11.5*  HCT 42.6 36.9* 34.7*  PLT 252 211 163  CREATININE 1.21 0.87 0.97  MG 2.7*  --  1.8  PHOS  --   --  2.7  ALBUMIN 4.1 3.2* 2.8*  PROT 7.1 5.5* 5.2*  AST 38 36 24  ALT 37 32 26  ALKPHOS 113 84 78  BILITOT 0.8 1.0 0.8   Estimated Creatinine Clearance: 84.2 mL/min (by C-G formula based on Cr of 0.97).   Recent Labs  01/22/16 0441 01/22/16 0736 01/22/16 0839  GLUCAP 98 67 130*    Microbiology: Recent Results (from the past 720 hour(s))  Blood culture (routine x 2)     Status: None (Preliminary result)   Collection Time: 01/21/16  4:20 AM  Result Value Ref Range Status   Specimen Description BLOOD RIGHT ANTECUBITAL  Final   Special Requests BOTTLES DRAWN AEROBIC AND ANAEROBIC  Final   Culture NO GROWTH 1 DAY  Final   Report Status PENDING  Incomplete  Blood culture (routine x 2)     Status: None (Preliminary result)   Collection Time: 01/21/16  4:20 AM  Result Value Ref Range Status   Specimen Description BLOOD  LEFT WRIST  Final   Special Requests BOTTLES DRAWN AEROBIC AND ANAEROBIC  Final   Culture NO GROWTH 1 DAY  Final   Report Status PENDING  Incomplete  MRSA PCR Screening     Status: None   Collection Time: 01/21/16  8:51 AM  Result Value Ref Range Status   MRSA by PCR NEGATIVE NEGATIVE Final    Comment:        The GeneXpert MRSA Assay (FDA approved for NASAL specimens only), is one component of a comprehensive MRSA colonization surveillance program. It is not intended to diagnose MRSA infection nor to guide or monitor treatment for MRSA infections.     Medications:  Scheduled:  . ampicillin-sulbactam (UNASYN) IV  3 g Intravenous Q6H  . antiseptic oral rinse  7 mL Mouth Rinse QID  . chlorhexidine gluconate  15 mL Mouth Rinse BID  . dextrose      . folic acid 1 mg in sodium chloride 0.9% 50 mL PB   Intravenous Daily  . heparin  5,000 Units Subcutaneous 3 times per day  . insulin aspart  2-6 Units Subcutaneous 6 times per day  . pantoprazole (PROTONIX) IV  40 mg Intravenous Q24H  . senna-docusate  1 tablet Per Tube BID  . sodium chloride flush  3 mL Intravenous Q12H  .  thiamine IV  100 mg Intravenous Daily   Infusions:  . sodium chloride 125 mL/hr at 01/22/16 0700  . fentaNYL infusion INTRAVENOUS Stopped (01/22/16 0937)  . midazolam (VERSED) infusion Stopped (01/22/16 4098)  . propofol (DIPRIVAN) infusion      Assessment: Pharmacy consulted for medication management for 55 yo male ICU patient requiring mechanical ventilation.    Plan:  1. Unasyn (1/7): Will continue patient on Unasyn 3g IV Q6hr to cover possible aspiration PNA.     2. Constipation: Patient without BM intubated and sedated. Will begin senna-docusate 1 tab VT bid.     3. Electrolytes: electrolytes are within normal limits. Will obtain follow-up electrolytes with am labs.    4. Glucose Management: Continue SSI coverage.    Pharmacy will continue to monitor and adjust per consult.    Luisa Hart  D 01/22/2016,11:19 AM

## 2016-01-22 NOTE — Progress Notes (Signed)
Nutrition Follow-up     INTERVENTION:   EN: received order to start TF per MD Kasa; recommend starting Adult Tube Feeding Protocol with Vital High Protein at rate of 20 ml/hr, initial goal of 40 ml/hr as per protocol. Pt sedation being changed, to be started on diprivan. Will assess new goal rate tomorrow based on kcals from diprivan. Continue to assess   NUTRITION DIAGNOSIS:   Inadequate oral intake related to acute illness as evidenced by NPO status.  GOAL:   Provide needs based on ASPEN/SCCM guidelines  MONITOR:    (Energy Intake, Anthropometrics, Electrolyte/Renal Profile, Digestive System, Pulmonary)  REASON FOR ASSESSMENT:   Consult Enteral/tube feeding initiation and management  ASSESSMENT:    Pt remains on vent, failed weaning attempt this AM  Diet Order:    NPO  Digestive system: OG coiled in stomach per xray, plan to replace OG and obtain new xray prior to TF initiation per Pam RN  Electrolyte and Renal Profile:  Recent Labs Lab 01/21/16 0257 01/21/16 1348 01/22/16 0451  BUN CREATININE 1.21 0.87 0.97  NA 139 145 146*  K 3.6 3.8 3.5  MG 2.7*  --  1.8  PHOS  --   --  2.7   Glucose Profile:   Recent Labs  01/22/16 0441 01/22/16 0736 01/22/16 0839  GLUCAP 98 67 130*   Meds: NS at 125 ml/hr (but IVF to be stopped once TF initiated per Pam RN), fentanyl, diprivan, ss novolog, folic acid, thiamine  Height:   Ht Readings from Last 1 Encounters:  01/21/16  (1.727 m)    Weight:   Wt Readings from Last 1 Encounters:  01/22/16 153 lb 10.6 oz (69.7 kg)    Filed Weights   01/21/16 0305 01/21/16 0852 01/22/16 0407  Weight: 180 lb 12.4 oz (82 kg) 153 lb (69.4 kg) 153 lb 10.6 oz (69.7 kg)    BMI:  Body mass index is 23.37 kg/(m^2).  Estimated Nutritional Needs:   Kcal:  1895 kcals (Ve: 8, Tmax: 38.4) using wt of 69.7 kg  Protein:  83-138 g (1.2-2.0 g/kg)   Fluid:  1725-2070 ml (25-30 ml/kg)   HIGH Care Level  Romelle Starcher  MS, RD, LDN [phone] Pager  [phone] Weekend/On-Call Pager

## 2016-01-22 NOTE — Progress Notes (Signed)
Hypoglycemic Event  CBG:66  Treatment: Protocol D  50 25 ml Symptoms: none  Follow-up CBG: [id number]  Possible Reasons for Event: NPO      Duane Hayes A

## 2016-01-22 NOTE — Progress Notes (Signed)

## 2016-01-22 NOTE — Consult Note (Signed)
PULMONARY / CRITICAL CARE MEDICINE   Name: Duane Hayes MRN: [id number] DOB: 1961/04/06    ADMISSION DATE:  01/21/2016 CONSULTATION DATE:  01/21/16  REFERRING MD :  Dr. Enedina Finner   CHIEF COMPLAINT:     Unresponsive Reason for consult - ventilator and critical care management.    HISTORY OF PRESENT ILLNESS   patient intubated and sedated.   elevated EtOH level, Urine tox positive for amphetamines, cannabis, TCAs.  Plan for SAT/SBT today  ICU day#2  SIGNIFICANT EVENTS  1/27 - found unresponsive,  given narcan,severely agitated, then loss of pulse, short course of CPR, intubated, but severely agitated,extubated, could not protect airway, reintubated and transferred to the ICU   Review of Systems  Unable to perform ROS: intubated      VITAL SIGNS    Temp:  [98.8 F (37.1 C)-100.8 F (38.2 C)] 100.8 F (38.2 C) (01/28 0900) Pulse Rate:  [78-107] 87 (01/28 0900) Resp:  [13-22] 19 (01/28 0900) BP: (81-101)/(52-66) 96/61 mmHg (01/28 0900) SpO2:  [87 %-98 %] 95 % (01/28 0900) FiO2 (%):  [30 %-40 %] 30 % (01/28 0800) Weight:  [153 lb 10.6 oz (69.7 kg)] 153 lb 10.6 oz (69.7 kg) (01/28 0407) HEMODYNAMICS:   VENTILATOR SETTINGS: Vent Mode:  [-] PRVC FiO2 (%):  [30 %-40 %] 30 % Set Rate:  [18 bmp] 18 bmp Vt Set:  [450 mL] 450 mL PEEP:  [5 cmH20] 5 cmH20 Plateau Pressure:  [14 cmH20-16 cmH20] 14 cmH20 INTAKE / OUTPUT:  Intake/Output Summary (Last 24 hours) at 01/22/16 1020 Last data filed at 01/22/16 0800  Gross per 24 hour  Intake 3426.5 ml  Output    600 ml  Net 2826.5 ml       PHYSICAL EXAM   Physical Exam  Constitutional: He appears well-developed and well-nourished.  HENT:  Head: Normocephalic and atraumatic.  Right Ear: External ear normal.  Left Ear: External ear normal.  Nose: Nose normal.  Mouth/Throat: Oropharynx is clear and moist.  Eyes: Right eye exhibits no discharge. Left eye exhibits no discharge. No scleral icterus.  Neck: Neck  supple. No JVD present. No tracheal deviation present. No thyromegaly present.  Cardiovascular: Normal rate, regular rhythm, normal heart sounds and intact distal pulses.   No murmur heard. Pulmonary/Chest: He has no wheezes. He has no rales.  Abdominal: Soft. Bowel sounds are normal. He exhibits no distension. There is no tenderness.  Musculoskeletal: He exhibits no edema.  Neurological:  Intubated and sedated.  Will follow some simple commands.   Skin: Skin is warm and dry. No erythema.  Nursing note and vitals reviewed.      LABS   LABS:  CBC  Recent Labs Lab 01/21/16 0257 01/21/16 1348 01/22/16 0451  WBC 9.0 10.4 9.1  HGB 14.1 12.4* 11.5*  HCT 42.6 36.9* 34.7*  PLT 252 211 163   Coag's No results for input(s): APTT, INR in the last 168 hours. BMET  Recent Labs Lab 01/21/16 0257 01/21/16 1348 01/22/16 0451  NA 139 145 146*  K 3.6 3.8 3.5  CL 103 111 112*  CO2 19* 24 31  BUN CREATININE 1.21 0.87 0.97  GLUCOSE 339* 83 98   Electrolytes  Recent Labs Lab 01/21/16 0257 01/21/16 1348 01/22/16 0451  CALCIUM 8.2* 7.4* 8.0*  MG 2.7*  --  1.8  PHOS  --   --  2.7   Sepsis Markers No results for input(s): LATICACIDVEN, PROCALCITON, O2SATVEN in the last 168 hours. ABG  Recent Labs Lab 01/21/16 0355 01/21/16 0930  PHART 7.10* 7.37  PCO2ART 48 47  PO2ART 78* 108   Liver Enzymes  Recent Labs Lab 01/21/16 0257 01/21/16 1348 01/22/16 0451  AST 38 36 24  ALT 37 32 26  ALKPHOS 113 84 78  BILITOT 0.8 1.0 0.8  ALBUMIN 4.1 3.2* 2.8*   Cardiac Enzymes  Recent Labs Lab 01/21/16 0257  TROPONINI <0.03   Glucose  Recent Labs Lab 01/21/16 1937 01/21/16 2341 01/22/16 0351 01/22/16 0441 01/22/16 0736 01/22/16 0839  GLUCAP 87 76 66 98 67 130*     Recent Results (from the past 240 hour(s))  Blood culture (routine x 2)     Status: None (Preliminary result)   Collection Time: 01/21/16  4:20 AM  Result Value Ref Range Status    Specimen Description BLOOD RIGHT ANTECUBITAL  Final   Special Requests BOTTLES DRAWN AEROBIC AND ANAEROBIC  Final   Culture NO GROWTH 1 DAY  Final   Report Status PENDING  Incomplete  Blood culture (routine x 2)     Status: None (Preliminary result)   Collection Time: 01/21/16  4:20 AM  Result Value Ref Range Status   Specimen Description BLOOD LEFT WRIST  Final   Special Requests BOTTLES DRAWN AEROBIC AND ANAEROBIC  Final   Culture NO GROWTH 1 DAY  Final   Report Status PENDING  Incomplete  MRSA PCR Screening     Status: None   Collection Time: 01/21/16  8:51 AM  Result Value Ref Range Status   MRSA by PCR NEGATIVE NEGATIVE Final    Comment:        The GeneXpert MRSA Assay (FDA approved for NASAL specimens only), is one component of a comprehensive MRSA colonization surveillance program. It is not intended to diagnose MRSA infection nor to guide or monitor treatment for MRSA infections.      Current facility-administered medications:  .  0.9 %  sodium chloride infusion, , Intravenous, Continuous, Arnaldo Natal, MD, Last Rate: 125 mL/hr at 01/22/16 0700 .  acetaminophen (TYLENOL) tablet 650 mg, 650 mg, Oral, Q6H PRN **OR** acetaminophen (TYLENOL) suppository 650 mg, 650 mg, Rectal, Q6H PRN, Arnaldo Natal, MD .  Ampicillin-Sulbactam (UNASYN) 3 g in sodium chloride 0.9 % 100 mL IVPB, 3 g, Intravenous, Q6H, Bertram Savin, RPH, 3 g at 01/22/16 0359 .  antiseptic oral rinse solution (CORINZ), 7 mL, Mouth Rinse, QID, Katha Hamming, MD, 7 mL at 01/22/16 0350 .  chlorhexidine gluconate (PERIDEX) 0.12 % solution 15 mL, 15 mL, Mouth Rinse, BID, Katha Hamming, MD, 15 mL at 01/22/16 0800 .  dextrose 50 % solution, , , ,  .  fentaNYL (SUBLIMAZE) injection 50 mcg, 50 mcg, Intravenous, Q15 min PRN, Arnaldo Natal, MD .  fentaNYL (SUBLIMAZE) injection 50 mcg, 50 mcg, Intravenous, Q2H PRN, Arnaldo Natal, MD .  fentaNYL in NS (52mcg/ml)  infusion-PREMIX, 150 mcg/hr, Intravenous, Continuous, Loleta Rose, MD, Stopped at 01/22/16 [phone] .  folic acid 1 mg in sodium chloride 0.9% 50 mL PB, , Intravenous, Daily, Katha Hamming, MD, Last Rate: 100 mL/hr at 01/22/16 0942 .  heparin injection 5,000 Units, 5,000 Units, Subcutaneous, 3 times per day, Arnaldo Natal, MD, 5,000 Units at 01/22/16 0544 .  insulin aspart (novoLOG) injection 2-6 Units, 2-6 Units, Subcutaneous, 6 times per day, Bertram Savin, [facility], 2 Units at 01/21/16 1700 .  midazolam (VERSED) 50 mg in sodium chloride 0.9 % 50 mL (1 mg/mL) infusion,  1 mg/hr, Intravenous, Continuous, Loleta Rose, MD, Stopped at 01/22/16 [phone]  midazolam (VERSED) injection 2 mg, 2 mg, Intravenous, Q15 min PRN, Arnaldo Natal, MD .  midazolam (VERSED) injection 2 mg, 2 mg, Intravenous, Q2H PRN, Arnaldo Natal, MD .  ondansetron [facility]) tablet 4 mg, 4 mg, Oral, Q6H PRN **OR** ondansetron (ZOFRAN) injection 4 mg, 4 mg, Intravenous, Q6H PRN, Arnaldo Natal, MD .  pantoprazole (PROTONIX) injection 40 mg, 40 mg, Intravenous, Q24H, Vishal Mungal, MD, 40 mg at 01/21/16 1228 .  propofol (DIPRIVAN) 1000 MG/100ML infusion, 0-50 mcg/kg/min, Intravenous, Continuous, Arnaldo Natal, MD, 0 mcg/kg/min at 01/21/16 0916 .  sodium chloride flush (NS) 0.9 % injection 3 mL, 3 mL, Intravenous, Q12H, Arnaldo Natal, MD, 3 mL at 01/22/16 0951 .  thiamine (B-1) injection 100 mg, 100 mg, Intravenous, Daily, Vishal Mungal, MD, 100 mg at 01/22/16 0943    Indwelling Urinary Catheter continued, requirement due to   Reason to continue Indwelling Urinary Catheter for strict Intake/Output monitoring for hemodynamic instability         Ventilator continued, requirement due to, resp failure    Ventilator Sedation RASS 0 to -2   Cultures: BCx2  UC  Sputum  Antibiotics: Unasyn 1/27>>     ASSESSMENT/PLAN  55 yo male found unresponsive in the field, received narcan, had CPR for 2 minutes,  intubated, transferred to ICU for further management and care. Urine tox positive for tricyclics, cocaine, amphetamines; alcohol level greater than 100. Acute resp failure and acute encephalopathy from acute cocaine abuse  PULMONARY Acute respiratory failure Metabolic acidosis Drug overdose/polysubstance abuse P:   -continue mechanical ventilation, wean as tolerated -Maintain O2 saturations greater than 88% -Continue with sedation/analgesia regiment, perform SBT/SAT daily -ABG as needed -Currently with moderate agitation at times, but will follow simple commands, plan to continue intubation given high level of alcohol and findings on urine toxicology, may be able to be extubated within the next 24-48 hours.  CARDIOVASCULAR -continue with hemodynamic monitoring  RENAL Metabolic Acidosis - cont with IVFs - ABG as needed.  - ICU electrolyte protocol  GASTROINTESTINAL SUP - PPI EtOH sue - monitor EtOH level - cont with IVF, thiamine, Folic Acid and MVI  HEMATOLOGIC -follow cbc  INFECTIOUS - currently on unasyn for possible aspiration  ENDOCRINE -ICU hypo/hyperglycemic protocol  NEUROLOGIC RASS goal: 1 - sedation/analgesia wean as tolerated.    I have personally obtained a history, examined the patient, evaluated laboratory and imaging results, formulated the assessment and plan and placed orders.  The Patient requires high complexity decision making for assessment and support, frequent evaluation and titration of therapies, application of advanced monitoring technologies and extensive interpretation of multiple databases. Critical Care Time devoted to patient care services described in this note is 35 minutes.   Overall, patient is critically ill, prognosis is guarded.   Lucie Leather, M.D.  [facility]  Medical Director [facility] Medical Director [facility] Cardio-Pulmonary Department

## 2016-01-22 NOTE — Progress Notes (Signed)
[facility] [place] [facility]   PATIENT NAME: Bosco Paparella    MR#:  [id number]  DATE OF BIRTH:  06-25-1961  Admitted for altered mental status, respiratory failure, possible drug overdose. sedation if off  but still not following the commands. Has temperature 101 Fahrenheit today.   CHIEF COMPLAINT:   Chief Complaint  Patient presents with  . Drug Overdose    REVIEW OF SYSTEMS:   Review of Systems  Unable to perform ROS: intubated     DRUG ALLERGIES:  No Known Allergies  VITALS:  Blood pressure 94/56, pulse 84, temperature 101.1 F (38.4 C), temperature source Core (Comment), resp. rate 18, height  (1.727 m), weight 69.7 kg (153 lb 10.6 oz), SpO2 96 %.  PHYSICAL EXAMINATION:  GENERAL:  55 y.o.-year-old patient lying in the bed ,critically ill,intubated/ EYES: Pupils equal, round, reactive to light , No scleral icterus. Extraocular muscles intact.  HEENT: Head atraumatic, normocephalic. Orally intubated. NECK:  Supple, no jugular venous distention. No thyroid enlargement, no tenderness.  LUNGS: Normal breath sounds bilaterally, no wheezing, rales,rhonchi or crepitation. No use of accessory muscles of respiration.  CARDIOVASCULAR: S1, S2 normal. No murmurs, rubs, or gallops.  ABDOMEN: Soft, nontender, nondistended. Bowel sounds present. No organomegaly or mass.  EXTREMITIES: No pedal edema, cyanosis, or clubbing.  NEUROLOGIC: unable to do full neuro exam due to intubation and sedation. PSYCHIATRIC: intubated/sedated. SKIN: No obvious rash, lesion, or ulcer.    LABORATORY PANEL:   CBC  Recent Labs Lab 01/22/16 0451  WBC 9.1  HGB 11.5*  HCT 34.7*  PLT 163   ------------------------------------------------------------------------------------------------------------------  Chemistries   Recent Labs Lab 01/22/16 0451  NA 146*  K 3.5  CL 112*  CO2 31  GLUCOSE 98  BUN 16  CREATININE 0.97  CALCIUM 8.0*  MG 1.8  AST 24   ALT 26  ALKPHOS 78  BILITOT 0.8   ------------------------------------------------------------------------------------------------------------------  Cardiac Enzymes  Recent Labs Lab 01/21/16 0257  TROPONINI <0.03   ------------------------------------------------------------------------------------------------------------------  RADIOLOGY:  Ct Head Wo Contrast  01/21/2016  CLINICAL DATA:  Intubated patient. Unresponsive. Possible overdose. Uncertain if trauma. EXAM: CT HEAD WITHOUT CONTRAST TECHNIQUE: Contiguous axial images were obtained from the base of the skull through the vertex without intravenous contrast. COMPARISON:  None. FINDINGS: Ventricles and sulci appear symmetrical. No ventricular dilatation. No mass effect or midline shift. No abnormal extra-axial fluid collections. Gray-white matter junctions are distinct. Basal cisterns are not effaced. No evidence of acute intracranial hemorrhage. No depressed skull fractures. Visualized paranasal sinuses and mastoid air cells are not opacified. Vascular calcifications. Old appearing nasal bone fractures. IMPRESSION: No acute intracranial abnormalities. Electronically Signed   By: Burman Nieves M.D.   On: 01/21/2016 03:45   Dg Chest Portable 1 View  01/21/2016  CLINICAL DATA:  Unresponsive patient.  Intubation. EXAM: PORTABLE CHEST 1 VIEW COMPARISON:  11/09/2015 FINDINGS: Endotracheal tube placed with tip measuring 4.8 cm above the carina. Enteric tube placed with tip in the left upper quadrant consistent with location in the upper stomach. Heart size is normal. There is new bilateral perihilar airspace disease suggesting edema or bilateral pneumonia. Right lung nodules likely representing calcified granulomas. No blunting of costophrenic angles. No pneumothorax. Mediastinal contours appear intact. IMPRESSION: Appliances appear in satisfactory location. New development of bilateral perihilar airspace disease suggesting pneumonia or  edema. Electronically Signed   By: Burman Nieves M.D.   On: 01/21/2016 03:35   Dg Abd Portable 1v  01/21/2016  CLINICAL DATA:  Patient unresponsive. Orogastric tube placement. Initial encounter. EXAM: PORTABLE ABDOMEN - 1 VIEW COMPARISON:  None. FINDINGS: The patient's enteric tube is noted coiling at the fundus of the stomach. The visualized bowel gas pattern is unremarkable. Scattered air and stool filled loops of colon are seen; no abnormal dilatation of small bowel loops is seen to suggest small bowel obstruction. No free intra-abdominal air is identified, though evaluation for free air is limited on a single supine view. The visualized osseous structures are within normal limits; the sacroiliac joints are unremarkable in appearance. The visualized lung bases are essentially clear. IMPRESSION: Enteric tube noted coiling at the fundus of the stomach. Electronically Signed   By: Roanna Raider M.D.   On: 01/21/2016 03:35    EKG:   Orders placed or performed during the hospital encounter of 01/21/16  . ED EKG  . ED EKG  . EKG 12-Lead  . EKG 12-Lead    ASSESSMENT AND PLAN:   1.acute respiratory failure ;due to Drug Overdose and alcohol intoxication;continue full  Vent support today,SBT trial  continue abx.continue IV Unasyn for possible aspiration  Fever today, follow blood cultures, urine cultures, repeat chest x-ray. 2.cardiac arrest with hypothermia/very brief CPR;now hypothermia and bradycardia are resolved, 3.metabolic acidosis ;improved with  Bicarb given in Er and IV  Fluids 4.possible suicidal attempt;according to sister ,pt is talking about suicidal iseation recently;needs psych consult after extubation and sittter 5.hypotension;likley due to sedation;continue iv lfuids 6.h/o ETOH abuse;continue Thiamine and folic acid .Tube feeding if he feels been trials. Continue IV fluids. Blood  sugar was around the 60 this morning. Condition  critical high risk for cardiac arrest. All the  records are reviewed and case discussed with Care Management/Social Workerr. Management plans discussed with the patient, family and they are in agreement.  CODE STATUS: full  TOTAL TIME TAKING CARE OF THIS PATIENT: 35  minutes. CCT  POSSIBLE D/C IN  2-3DAYS, DEPENDING ON CLINICAL CONDITION.   Katha Hamming M.D on 01/22/2016 at 12:32 PM  Between 7am to 6pm - Pager - [phone]  After 6pm go to [email or web address] - password [email or web address] [facility]  [facility]  Office  [phone]  CC: Primary care physician; No PCP Per Patient   Note: This dictation was prepared with Dragon dictation along with smaller phrase technology. Any transcriptional errors that result from this process are unintentional.

## 2016-01-22 NOTE — Progress Notes (Signed)
°   01/22/16 1400  Clinical Encounter Type  Visited With Health care provider  Visit Type Follow-up  Referral From Nurse  Consult/Referral To Chaplain  Spiritual Encounters  Spiritual Needs Other (Comment)  Stress Factors  Patient Stress Factors Not reviewed  Family Stress Factors Not reviewed  Chaplain responded to consult order.  Patient sleeping.  Nurse noted that tomorrow would likely be a better day to visit. Chaplain [employer] Ext [phone]

## 2016-01-23 ENCOUNTER — Inpatient Hospital Stay
Admit: 2016-01-23 | Discharge: 2016-01-23 | Disposition: A | Discharge status: Home / Self Care | Payer: Self-pay | Attending: Internal Medicine | Admitting: Internal Medicine

## 2016-01-23 LAB — BASIC METABOLIC PANEL
ANION GAP: 4 — AB (ref 5–15)
BUN: 13 mg/dL (ref 6–20)
CALCIUM: 8.1 mg/dL — AB (ref 8.9–10.3)
CO2: 30 mmol/L (ref 22–32)
Chloride: 110 mmol/L (ref 101–111)
Creatinine, Ser: 1.04 mg/dL (ref 0.61–1.24)
Glucose, Bld: 107 mg/dL — ABNORMAL HIGH (ref 65–99)
POTASSIUM: 3.7 mmol/L (ref 3.5–5.1)
Sodium: 144 mmol/L (ref 135–145)

## 2016-01-23 LAB — GLUCOSE, CAPILLARY
Glucose-Capillary: 108 mg/dL — ABNORMAL HIGH (ref 65–99)
Glucose-Capillary: 119 mg/dL — ABNORMAL HIGH (ref 65–99)
Glucose-Capillary: 120 mg/dL — ABNORMAL HIGH (ref 65–99)
Glucose-Capillary: 122 mg/dL — ABNORMAL HIGH (ref 65–99)
Glucose-Capillary: 136 mg/dL — ABNORMAL HIGH (ref 65–99)
Glucose-Capillary: 96 mg/dL (ref 65–99)

## 2016-01-23 LAB — TRIGLYCERIDES: TRIGLYCERIDES: 127 mg/dL (ref <150)

## 2016-01-23 MED ORDER — VITAL HIGH PROTEIN PO LIQD
1000.0000 mL | ORAL | Status: DC
  Administered 2016-01-23: 1000 mL

## 2016-01-23 NOTE — Progress Notes (Signed)
Nutrition Follow-up    INTERVENTION:   EN: wit h current diprivan/D5, recommend increasing TF to rate of 60 ml/hr, continue to assess   NUTRITION DIAGNOSIS:   Inadequate oral intake related to acute illness as evidenced by NPO status. Being addressed via TF  GOAL:   Provide needs based on ASPEN/SCCM guidelines  MONITOR:    (Energy Intake, Anthropometrics, Electrolyte/Renal Profile, Digestive System, Pulmonary)  REASON FOR ASSESSMENT:   Consult Enteral/tube feeding initiation and management  ASSESSMENT:      EN: tolerating Vital High Protein at rate of 40 ml/hr  Digestive System: no signs of TF intolerance, abdomen soft, BS active, no BM  Electrolyte and Renal Profile:  Recent Labs Lab 01/21/16 0257 01/21/16 1348 01/22/16 0451 01/23/16 0420  BUN CREATININE 1.21 0.87 0.97 1.04  NA 139 145 146* 144  K 3.6 3.8 3.5 3.7  MG 2.7*  --  1.8  --   PHOS  --   --  2.7  --    Glucose Profile:  Recent Labs  01/22/16 2328 01/23/16 0352 01/23/16 0758  GLUCAP 115* 96 119*   Meds: diprivan 121 kcals in 24 hours per I/O, ss novolog, D5 at 10 ml/hr (41 kcals)  Height:   Ht Readings from Last 1 Encounters:  01/21/16  (1.727 m)    Weight:   Wt Readings from Last 1 Encounters:  01/23/16 154 lb 1.6 oz (69.9 kg)    BMI:  Body mass index is 23.44 kg/(m^2).  Estimated Nutritional Needs:   Kcal:  1895 kcals (Ve: 8, Tmax: 38.4) using wt of 69.7 kg  Protein:  83-138 g (1.2-2.0 g/kg)   Fluid:  1725-2070 ml (25-30 ml/kg)   HIGH Care Level  Romelle Starcher MS, RD, LDN [phone] Pager  [phone] Weekend/On-Call Pager

## 2016-01-23 NOTE — Progress Notes (Signed)
PHARMACY - CRITICAL CARE PROGRESS NOTE  Pharmacy Consult for Unasyn (1/7), Constipation Prevention, Electrolyte Replacement and Glucose Management     No Known Allergies  Patient Measurements: Height:  (172.7 cm) Weight: 154 lb 1.6 oz (69.9 kg) IBW/kg (Calculated) : 68.4   Vital Signs: Temp: 100 F (37.8 C) (01/29 0700) Temp Source: Other (Comment) (01/29 0400) BP: 96/55 mmHg (01/29 0700) Pulse Rate: 87 (01/29 0700) Intake/Output from previous day: 01/28 0701 - 01/29 0700 In: 2725.4 [I.V.:1302.3; NG/GT:1023.2; IV Piggyback:400] Out: 1455 [Urine:1455] Intake/Output from this shift:   Vent settings for last 24 hours: Vent Mode:  [-] PRVC FiO2 (%):  [30 %] 30 % Set Rate:  [18 bmp] 18 bmp Vt Set:  [450 mL] 450 mL PEEP:  [5 cmH20] 5 cmH20  Labs:  Recent Labs  01/21/16 0257 01/21/16 1348 01/22/16 0451 01/23/16 0420  WBC 9.0 10.4 9.1  --   HGB 14.1 12.4* 11.5*  --   HCT 42.6 36.9* 34.7*  --   PLT 252 211 163  --   CREATININE 1.21 0.87 0.97 1.04  MG 2.7*  --  1.8  --   PHOS  --   --  2.7  --   ALBUMIN 4.1 3.2* 2.8*  --   PROT 7.1 5.5* 5.2*  --   AST 38 36 24  --   ALT 37 32 26  --   ALKPHOS 113 84 78  --   BILITOT 0.8 1.0 0.8  --    Estimated Creatinine Clearance: 78.6 mL/min (by C-G formula based on Cr of 1.04).   Recent Labs  01/22/16 1931 01/22/16 2328 01/23/16 0352  GLUCAP 94 115* 96    Microbiology: Recent Results (from the past 720 hour(s))  Blood culture (routine x 2)     Status: None (Preliminary result)   Collection Time: 01/21/16  4:20 AM  Result Value Ref Range Status   Specimen Description BLOOD RIGHT ANTECUBITAL  Final   Special Requests BOTTLES DRAWN AEROBIC AND ANAEROBIC  Final   Culture NO GROWTH 1 DAY  Final   Report Status PENDING  Incomplete  Blood culture (routine x 2)     Status: None (Preliminary result)   Collection Time: 01/21/16  4:20 AM  Result Value Ref Range Status   Specimen Description BLOOD LEFT WRIST  Final    Special Requests BOTTLES DRAWN AEROBIC AND ANAEROBIC  Final   Culture NO GROWTH 1 DAY  Final   Report Status PENDING  Incomplete  MRSA PCR Screening     Status: None   Collection Time: 01/21/16  8:51 AM  Result Value Ref Range Status   MRSA by PCR NEGATIVE NEGATIVE Final    Comment:        The GeneXpert MRSA Assay (FDA approved for NASAL specimens only), is one component of a comprehensive MRSA colonization surveillance program. It is not intended to diagnose MRSA infection nor to guide or monitor treatment for MRSA infections.     Medications:  Scheduled:  . ampicillin-sulbactam (UNASYN) IV  3 g Intravenous Q6H  . antiseptic oral rinse  7 mL Mouth Rinse QID  . chlorhexidine gluconate  15 mL Mouth Rinse BID  . sennosides  5 mL Per Tube BID   And  . docusate  100 mg Per Tube BID  . feeding supplement (VITAL HIGH PROTEIN)  1,000 mL Per Tube Q24H  . folic acid 1 mg in sodium chloride 0.9% 50 mL PB   Intravenous Daily  .  free water  200 mL Per Tube 3 times per day  . heparin  5,000 Units Subcutaneous 3 times per day  . insulin aspart  2-6 Units Subcutaneous 6 times per day  . pantoprazole (PROTONIX) IV  40 mg Intravenous Q24H  . sodium chloride flush  3 mL Intravenous Q12H  . thiamine IV  100 mg Intravenous Daily   Infusions:  . dextrose 10 mL/hr at 01/23/16 0700  . fentaNYL infusion INTRAVENOUS 200 mcg/hr (01/23/16 0700)  . midazolam (VERSED) infusion Stopped (01/22/16 1610)  . propofol (DIPRIVAN) infusion 15.065 mcg/kg/min (01/23/16 0700)    Assessment: Pharmacy consulted for medication management for 55 yo male ICU patient requiring mechanical ventilation.    Plan:  1. Unasyn (1/7): Will continue patient on Unasyn 3g IV Q6hr to cover possible aspiration PNA.     2. Constipation: Patient intubated and sedated with no BM to date. Will continue senna-docusate 1 tab VT bid.     3. Electrolytes: electrolytes are within normal limits. Will obtain follow-up  electrolytes with am labs.    4. Glucose Management: Continue SSI coverage.    Pharmacy will continue to monitor and adjust per consult.    Luisa Hart D 01/23/2016,7:07 AM

## 2016-01-23 NOTE — Progress Notes (Signed)
Spoke with elink MD about possibly changing patient to precedex.  She cameraed in and stated pt is looking comfortable at the moment so leave pt on present gtt rates but if blood pressures go much lower would consider a small bolus.

## 2016-01-23 NOTE — Progress Notes (Addendum)
[facility] Critical Care Medicine Progess Note    ASSESSMENT/PLAN    55 yo male found unresponsive in the field, received narcan, had CPR for 2 minutes, intubated, transferred to ICU for further management and care. Urine tox positive for tricyclics, cocaine, amphetamines; alcohol level greater than 100. Acute resp failure and acute encephalopathy from acute cocaine abuse  PULMONARY Acute respiratory failure Metabolic acidosis Drug overdose/polysubstance abuse P:  -Patient did not tolerate a brief weaning trial today due to excess sedation. As we attempted to reduce sedation, the patient became increasingly agitated and could not completing weaning trial.  CARDIOVASCULAR -continue with hemodynamic monitoring  RENAL Metabolic Acidosis - cont with IVFs - ABG as needed.  - ICU electrolyte protocol  GASTROINTESTINAL SUP - PPI EtOH sue - monitor EtOH level - cont with IVF, thiamine, Folic Acid and MVI  HEMATOLOGIC -follow cbc  INFECTIOUS - currently on unasyn for possible aspiration  ENDOCRINE -ICU hypo/hyperglycemic protocol  NEUROLOGIC RASS goal: 1 ?EtOH withdrawal. We'll start the patient on protocol. - sedation/analgesia wean as tolerated.  ---------------------------------------   ----------------------------------------   Name: Duane Hayes MRN: [id number] DOB: 07/23/1961    ADMISSION DATE:  01/21/2016      SUBJECTIVE:   Pt currently on the ventilator, can not provide history or review of systems.    VITAL SIGNS: Temp:  [99.5 F (37.5 C)-101.5 F (38.6 C)] 100.9 F (38.3 C) (01/29 1500) Pulse Rate:  [77-91] 82 (01/29 1500) Resp:  [16-21] 18 (01/29 1500) BP: (85-113)/(50-67) 91/50 mmHg (01/29 1500) SpO2:  [92 %-99 %] 97 % (01/29 1500) FiO2 (%):  [30 %] 30 % (01/29 1153) Weight:  [154 lb 1.6 oz (69.9 kg)] 154 lb 1.6 oz (69.9 kg) (01/29 0400) HEMODYNAMICS:   VENTILATOR SETTINGS: Vent Mode:  [-] PRVC FiO2 (%):  [30 %] 30 % Set  Rate:  [18 bmp] 18 bmp Vt Set:  [450 mL] 450 mL PEEP:  [5 cmH20] 5 cmH20 INTAKE / OUTPUT:  Intake/Output Summary (Last 24 hours) at 01/23/16 1635 Last data filed at 01/23/16 1400  Gross per 24 hour  Intake 1475.15 ml  Output   2505 ml  Net -1029.85 ml    PHYSICAL EXAMINATION: Physical Examination:   VS: BP 91/50 mmHg  Pulse 82  Temp(Src) 100.9 F (38.3 C) (Other (Comment))  Resp 18  Ht  (1.727 m)  Wt 154 lb 1.6 oz (69.9 kg)  BMI 23.44 kg/m2  SpO2 97%  General Appearance: No distress  Neuro:without focal findings,  HEENT: PERRLA, EOM intact. Pulmonary: normal breath sounds decreased bilaterally.   CardiovascularNormal S1,S2.  No m/r/g.   Abdomen: Benign, Soft, non-tender. Renal:  No costovertebral tenderness  GU:  Not performed at this time. Endocrine: No evident thyromegaly. Skin:   warm, no rashes, no ecchymosis  Extremities: normal, no cyanosis, clubbing.   LABS:   LABORATORY PANEL:   CBC  Recent Labs Lab 01/22/16 0451  WBC 9.1  HGB 11.5*  HCT 34.7*  PLT 163    Chemistries   Recent Labs Lab 01/22/16 0451 01/23/16 0420  NA 146* 144  K 3.5 3.7  CL 112* 110  CO2 31 30  GLUCOSE 98 107*  BUN 16 13  CREATININE 0.97 1.04  CALCIUM 8.0* 8.1*  MG 1.8  --   PHOS 2.7  --   AST 24  --   ALT 26  --   ALKPHOS 78  --   BILITOT 0.8  --      Recent Labs Lab  01/22/16 1931 01/22/16 2328 01/23/16 0352 01/23/16 0758 01/23/16 1139 01/23/16 1540  GLUCAP 94 115* 96 119* 108* 122*    Recent Labs Lab 01/21/16 0355 01/21/16 0930  PHART 7.10* 7.37  PCO2ART 48 47  PO2ART 78* 108    Recent Labs Lab 01/21/16 0257 01/21/16 1348 01/22/16 0451  AST 38 36 24  ALT 37 32 26  ALKPHOS 113 84 78  BILITOT 0.8 1.0 0.8  ALBUMIN 4.1 3.2* 2.8*    Cardiac Enzymes  Recent Labs Lab 01/21/16 0257  TROPONINI <0.03    RADIOLOGY:  No results found.     Wells Guiles, MD.  Pager [phone] [facility] Office Number: [phone]  Santiago Glad, M.D.  Stephanie Acre, M.D.  Billy Fischer, M.D  Critical Care Attestation.  I have personally obtained a history, examined the patient, evaluated laboratory and imaging results, formulated the assessment and plan and placed orders. The Patient requires high complexity decision making for assessment and support, frequent evaluation and titration of therapies, application of advanced monitoring technologies and extensive interpretation of multiple databases. The patient has critical illness that could lead imminently to failure of 1 or more organ systems and requires the highest level of physician preparedness to intervene.  Critical Care Time devoted to patient care services described in this note is 35 minutes and is exclusive of time spent in procedures.

## 2016-01-23 NOTE — Progress Notes (Signed)
[facility] - [place] at [facility]   PATIENT NAME: Duane Hayes    MR#:  [id number]  DATE OF BIRTH:  1961/09/06  Admitted for altered mental status, respiratory failure, possible drug overdose.  Not Tolerating the weaning trials yet . has low-grade temperature 100F today.   CHIEF COMPLAINT:   Chief Complaint  Patient presents with  . Drug Overdose    REVIEW OF SYSTEMS:   Review of Systems  Unable to perform ROS: intubated     DRUG ALLERGIES:  No Known Allergies  VITALS:  Blood pressure 96/55, pulse 87, temperature 100 F (37.8 C), temperature source Other (Comment), resp. rate 18, height  (1.727 m), weight 69.9 kg (154 lb 1.6 oz), SpO2 95 %.  PHYSICAL EXAMINATION:  GENERAL:  55 y.o.-year-old patient lying in the bed ,critically ill,intubated/ EYES: Pupils equal, round, reactive to light , No scleral icterus. Extraocular muscles intact.  HEENT: Head atraumatic, normocephalic. Orally intubated. NECK:  Supple, no jugular venous distention. No thyroid enlargement, no tenderness.  LUNGS: Normal breath sounds bilaterally, no wheezing, rales,rhonchi or crepitation. No use of accessory muscles of respiration.  CARDIOVASCULAR: S1, S2 normal. No murmurs, rubs, or gallops.  ABDOMEN: Soft, nontender, nondistended. Bowel sounds present. No organomegaly or mass.  EXTREMITIES: No pedal edema, cyanosis, or clubbing.  NEUROLOGIC: unable to do full neuro exam due to intubation and sedation. PSYCHIATRIC: intubated/sedated., And is being weaned off, noted that he was agitated  SKIN: No obvious rash, lesion, or ulcer.    LABORATORY PANEL:   CBC  Recent Labs Lab 01/22/16 0451  WBC 9.1  HGB 11.5*  HCT 34.7*  PLT 163   ------------------------------------------------------------------------------------------------------------------  Chemistries   Recent Labs Lab 01/22/16 0451 01/23/16 0420  NA 146* 144  K 3.5 3.7  CL 112* 110  CO2 31 30   GLUCOSE 98 107*  BUN 16 13  CREATININE 0.97 1.04  CALCIUM 8.0* 8.1*  MG 1.8  --   AST 24  --   ALT 26  --   ALKPHOS 78  --   BILITOT 0.8  --    ------------------------------------------------------------------------------------------------------------------  Cardiac Enzymes  Recent Labs Lab 01/21/16 0257  TROPONINI <0.03   ------------------------------------------------------------------------------------------------------------------  RADIOLOGY:  No results found.  EKG:   Orders placed or performed during the hospital encounter of 01/21/16  . ED EKG  . ED EKG  . EKG 12-Lead  . EKG 12-Lead    ASSESSMENT AND PLAN:   1.acute respiratory failure ;due to Drug Overdose and alcohol intoxication; continue full vent support, weaning trials as per pulmonary and critical care.   Fever today, follow blood cultures, urine cultures, repeat chest x-ray. Continue Unasyn IV. 2.cardiac arrest with hypothermia/very brief CPR;now hypothermia and bradycardia are resolved, 3.metabolic acidosis ;improved with  Bicarb given in Er and IV  Fluids, metabolic acidosis improved. 4.possible suicidal attempt;according to sister ,pt is talking about suicidal iseation recently;needs psych consult after extubation and sittter 5.hypotension;likley due to sedation;continue iv lfuids 6.h/o ETOH abuse;continue Thiamine and folic acid 7 nutrition: Started. Tube feedings. Condition  critical high risk for cardiac arrest. All the records are reviewed and case discussed with Care Management/Social Workerr. Management plans discussed with the patient, family and they are in agreement.  CODE STATUS: full  TOTAL TIME TAKING CARE OF THIS PATIENT: 35  minutes. CCT  POSSIBLE D/C IN  2-3DAYS, DEPENDING ON CLINICAL CONDITION.   Katha Hamming M.D on 01/23/2016 at 1:07 PM  Between 7am to 6pm - Pager - [phone]  After 6pm go to [email or web address] - password [email or web address] [facility]  [facility]   Office  [phone]  CC: Primary care physician; No PCP Per Patient   Note: This dictation was prepared with Dragon dictation along with smaller phrase technology. Any transcriptional errors that result from this process are unintentional.

## 2016-01-24 LAB — CBC
HCT: 29.5 % — ABNORMAL LOW (ref 40.0–52.0)
HEMOGLOBIN: 9.6 g/dL — AB (ref 13.0–18.0)
MCH: 31.3 pg (ref 26.0–34.0)
MCHC: 32.5 g/dL (ref 32.0–36.0)
MCV: 96.4 fL (ref 80.0–100.0)
PLATELETS: 134 10*3/uL — AB (ref 150–440)
RBC: 3.06 MIL/uL — ABNORMAL LOW (ref 4.40–5.90)
RDW: 14.6 % — AB (ref 11.5–14.5)
WBC: 5.9 10*3/uL (ref 3.8–10.6)

## 2016-01-24 LAB — BASIC METABOLIC PANEL
Anion gap: 4 — ABNORMAL LOW (ref 5–15)
BUN: 13 mg/dL (ref 6–20)
CALCIUM: 8 mg/dL — AB (ref 8.9–10.3)
CHLORIDE: 110 mmol/L (ref 101–111)
CO2: 28 mmol/L (ref 22–32)
CREATININE: 0.92 mg/dL (ref 0.61–1.24)
Glucose, Bld: 125 mg/dL — ABNORMAL HIGH (ref 65–99)
Potassium: 3.6 mmol/L (ref 3.5–5.1)
SODIUM: 142 mmol/L (ref 135–145)

## 2016-01-24 LAB — GLUCOSE, CAPILLARY
Glucose-Capillary: 103 mg/dL — ABNORMAL HIGH (ref 65–99)
Glucose-Capillary: 116 mg/dL — ABNORMAL HIGH (ref 65–99)
Glucose-Capillary: 114 mg/dL — ABNORMAL HIGH (ref 65–99)
Glucose-Capillary: 85 mg/dL (ref 65–99)
Glucose-Capillary: 102 mg/dL — ABNORMAL HIGH (ref 65–99)

## 2016-01-24 LAB — MAGNESIUM: MAGNESIUM: 1.8 mg/dL (ref 1.7–2.4)

## 2016-01-24 LAB — PHOSPHORUS: PHOSPHORUS: 3.6 mg/dL (ref 2.5–4.6)

## 2016-01-24 MED ORDER — PRO-STAT SUGAR FREE PO LIQD
30.0000 mL | Freq: Every day | ORAL | Status: DC
  Administered 2016-01-25: 30 mL via ORAL

## 2016-01-24 MED ORDER — DIAZEPAM 5 MG/ML IJ SOLN
5.0000 mg | Freq: Four times a day (QID) | INTRAMUSCULAR | Status: DC
  Administered 2016-01-24 – 2016-01-27 (×11): 5 mg via INTRAVENOUS
  Filled 2016-01-24 (×11): qty 2

## 2016-01-24 MED ORDER — VITAL 1.5 CAL PO LIQD
1000.0000 mL | ORAL | Status: DC
  Administered 2016-01-24 – 2016-01-25 (×2): 1000 mL

## 2016-01-24 MED ORDER — SENNOSIDES-DOCUSATE SODIUM 8.6-50 MG PO TABS
2.0000 | ORAL_TABLET | Freq: Two times a day (BID) | ORAL | Status: DC
  Administered 2016-01-24 – 2016-01-31 (×5): 2 via ORAL
  Filled 2016-01-24 (×5): qty 2

## 2016-01-24 MED ORDER — LORAZEPAM 2 MG/ML IJ SOLN
1.0000 mg | INTRAMUSCULAR | Status: DC | PRN
  Administered 2016-01-24 – 2016-01-27 (×10): 2 mg via INTRAVENOUS
  Filled 2016-01-24 (×13): qty 1

## 2016-01-24 MED ORDER — PANTOPRAZOLE SODIUM 40 MG PO PACK
40.0000 mg | PACK | ORAL | Status: DC
  Administered 2016-01-24 – 2016-01-25 (×2): 40 mg
  Filled 2016-01-24 (×3): qty 20

## 2016-01-24 MED ORDER — FOLIC ACID 1 MG PO TABS
1.0000 mg | ORAL_TABLET | Freq: Every day | ORAL | Status: DC
  Administered 2016-01-25 – 2016-02-07 (×9): 1 mg via ORAL
  Filled 2016-01-24 (×9): qty 1

## 2016-01-24 MED ORDER — ALPRAZOLAM 0.5 MG PO TABS
0.5000 mg | ORAL_TABLET | Freq: Three times a day (TID) | ORAL | Status: DC
  Administered 2016-01-24: 0.5 mg via ORAL
  Filled 2016-01-24: qty 1

## 2016-01-24 MED ORDER — MAGNESIUM SULFATE 2 GM/50ML IV SOLN
2.0000 g | Freq: Once | INTRAVENOUS | Status: AC
  Administered 2016-01-24: 2 g via INTRAVENOUS
  Filled 2016-01-24: qty 50

## 2016-01-24 MED ORDER — VITAMIN B-1 100 MG PO TABS
100.0000 mg | ORAL_TABLET | Freq: Every day | ORAL | Status: DC
Start: 2016-01-25 — End: 2016-02-04
  Administered 2016-01-25 – 2016-02-03 (×5): 100 mg via ORAL
  Filled 2016-01-24 (×5): qty 1

## 2016-01-24 NOTE — Care Management (Signed)
Patient found unresponsive in a hotel room.  Thought to be related to polysubstance abuse/ overdose.  intubated because of inability to protect his airway.  patient at present does not have a payor.  Unsure of current living situation.  CSW referral present

## 2016-01-24 NOTE — Progress Notes (Signed)
PHARMACY - CRITICAL CARE PROGRESS NOTE  Pharmacy Consult for Unasyn (4/7), Constipation Prevention, Electrolyte Replacement and Glucose Management    No Known Allergies  Patient Measurements: Height:  (172.7 cm) Weight: 156 lb 8.4 oz (71 kg) IBW/kg (Calculated) : 68.4   Vital Signs: Temp: 98.2 F (36.8 C) (01/30 1400) BP: 106/65 mmHg (01/30 1400) Pulse Rate: 62 (01/30 1400) Intake/Output from previous day: 01/29 0701 - 01/30 0700 In: 2044.4 [I.V.:534.4; NG/GT:1310; IV Piggyback:200] Out: 1735 [Urine:1735] Intake/Output from this shift: Total I/O In: 1413.5 [I.V.:893.5; NG/GT:420; IV Piggyback:100] Out: 25 [Urine:25] Vent settings for last 24 hours: Vent Mode:  [-] PRVC FiO2 (%):  [30 %] 30 % Set Rate:  [18 bmp] 18 bmp Vt Set:  [450 mL] 450 mL PEEP:  [5 cmH20] 5 cmH20  Labs:  Recent Labs  01/22/16 0451 01/23/16 0420 01/24/16 0630  WBC 9.1  --  5.9  HGB 11.5*  --  9.6*  HCT 34.7*  --  29.5*  PLT 163  --  134*  CREATININE 0.97 1.04 0.92  MG 1.8  --  1.8  PHOS 2.7  --  3.6  ALBUMIN 2.8*  --   --   PROT 5.2*  --   --   AST 24  --   --   ALT 26  --   --   ALKPHOS 78  --   --   BILITOT 0.8  --   --    Estimated Creatinine Clearance: 88.8 mL/min (by C-G formula based on Cr of 0.92).   Recent Labs  01/24/16 0424 01/24/16 0822 01/24/16 1119  GLUCAP 103* 116* 114*    Microbiology: Recent Results (from the past 720 hour(s))  Blood culture (routine x 2)     Status: None (Preliminary result)   Collection Time: 01/21/16  4:20 AM  Result Value Ref Range Status   Specimen Description BLOOD RIGHT ANTECUBITAL  Final   Special Requests BOTTLES DRAWN AEROBIC AND ANAEROBIC  Final   Culture NO GROWTH 3 DAYS  Final   Report Status PENDING  Incomplete  Blood culture (routine x 2)     Status: None (Preliminary result)   Collection Time: 01/21/16  4:20 AM  Result Value Ref Range Status   Specimen Description BLOOD LEFT WRIST  Final   Special Requests  BOTTLES DRAWN AEROBIC AND ANAEROBIC  Final   Culture NO GROWTH 3 DAYS  Final   Report Status PENDING  Incomplete  MRSA PCR Screening     Status: None   Collection Time: 01/21/16  8:51 AM  Result Value Ref Range Status   MRSA by PCR NEGATIVE NEGATIVE Final    Comment:        The GeneXpert MRSA Assay (FDA approved for NASAL specimens only), is one component of a comprehensive MRSA colonization surveillance program. It is not intended to diagnose MRSA infection nor to guide or monitor treatment for MRSA infections.   Urine culture     Status: None (Preliminary result)   Collection Time: 01/23/16  6:09 PM  Result Value Ref Range Status   Specimen Description URINE, CATHETERIZED  Final   Special Requests Normal  Final   Culture NO GROWTH < 24 HOURS  Final   Report Status PENDING  Incomplete    Medications:  Scheduled:  . ALPRAZolam  0.5 mg Oral TID  . ampicillin-sulbactam (UNASYN) IV  3 g Intravenous Q6H  . antiseptic oral rinse  7 mL Mouth Rinse QID  . chlorhexidine gluconate  15 mL Mouth Rinse BID  . [START ON 01/25/2016] feeding supplement (PRO-STAT SUGAR FREE 64)  30 mL Oral Daily  . [START ON 01/25/2016] folic acid  1 mg Oral Daily  . free water  200 mL Per Tube 3 times per day  . heparin  5,000 Units Subcutaneous 3 times per day  . insulin aspart  2-6 Units Subcutaneous 6 times per day  . pantoprazole sodium  40 mg Per Tube Q24H  . senna-docusate  2 tablet Oral BID  . sodium chloride flush  3 mL Intravenous Q12H  . [START ON 01/25/2016] thiamine  100 mg Oral Daily   Infusions:  . dextrose 10 mL/hr at 01/24/16 0700  . feeding supplement (VITAL 1.5 CAL) 1,000 mL (01/24/16 1030)  . fentaNYL infusion INTRAVENOUS 200 mcg/hr (01/24/16 1200)    Assessment: Pharmacy consulted for medication management for 55 yo male ICU patient requiring mechanical ventilation.    Plan:  1. Unasyn (4/7): Will continue patient on Unasyn 3g IV Q6hr to cover possible aspiration PNA.      2. Constipation: Patient intubated and sedated with no BM to date. Will continue senna-docusate 2 tab VT bid.     3. Electrolytes: electrolytes are within normal limits. Will obtain follow-up electrolytes with am labs.    4. Glucose Management: Continue SSI coverage.    Pharmacy will continue to monitor and adjust per consult.    Simpson,Michael L 01/24/2016,4:08 PM

## 2016-01-24 NOTE — Progress Notes (Signed)
Initial Nutrition Assessment    INTERVENTION:   EN: off diprivan, recommend changing to Vital 1.5 at goal of 50 ml/hr with Prostat daily providing 1900 kcals, 97 g of protein. Continue to assess  NUTRITION DIAGNOSIS:   Inadequate oral intake related to acute illness as evidenced by NPO status.   GOAL:   Provide needs based on ASPEN/SCCM guidelines   MONITOR:    (Energy Intake, Anthropometrics, Electrolyte/Renal Profile, Digestive System, Pulmonary)  REASON FOR ASSESSMENT:   Consult Enteral/tube feeding initiation and management  ASSESSMENT:    Pt remains on vent  EN: tolerating Vital High Protein at rate of 60 ml/hr  Digestive system: no BM, no signs of TF intolerance  Protein Profile:  Recent Labs Lab 01/21/16 0257 01/21/16 1348 01/22/16 0451  ALBUMIN 4.1 3.2* 2.8*   Electrolyte and Renal Profile:  Recent Labs Lab 01/21/16 0257  01/22/16 0451 01/23/16 0420 01/24/16 0630  BUN 12  < > CREATININE 1.21  < > 0.97 1.04 0.92  NA 139  < > 146* 144 142  K 3.6  < > 3.5 3.7 3.6  MG 2.7*  --  1.8  --  1.8  PHOS  --   --  2.7  --  3.6  < > = values in this interval not displayed. Glucose Profile:  Recent Labs  01/24/16 0424 01/24/16 0822 01/24/16 1119  GLUCAP 103* 116* 114*   Meds: ss novolog, diprivan off  Height:   Ht Readings from Last 1 Encounters:  01/21/16  (1.727 m)    Weight:   Wt Readings from Last 1 Encounters:  01/24/16 156 lb 8.4 oz (71 kg)   BMI:  Body mass index is 23.81 kg/(m^2).  Estimated Nutritional Needs:   Kcal:  1895 kcals (Ve: 8, Tmax: 38.4) using wt of 69.7 kg  Protein:  83-138 g (1.2-2.0 g/kg)   Fluid:  1725-2070 ml (25-30 ml/kg)    HIGH Care Level  Romelle Starcher MS, RD, LDN [phone] Pager  [phone] Weekend/On-Call Pager

## 2016-01-24 NOTE — Progress Notes (Signed)
[facility] Physicians - [place] at [facility]   PATIENT NAME: Duane Hayes    MR#:  [id number]  DATE OF BIRTH:  1961/04/25  Admitted for altered mental status, respiratory failure, possible drug overdose.  Not Tolerating the weaning trials yet . has low-grade temperature 100F today.   CHIEF COMPLAINT:   Chief Complaint  Patient presents with  . Drug Overdose  on vent and sedation. Opened eyes on stimuli.  REVIEW OF SYSTEMS:   Review of Systems  Unable to perform ROS: intubated     DRUG ALLERGIES:  No Known Allergies  VITALS:  Blood pressure 91/60, pulse 64, temperature 98.2 F (36.8 C), temperature source Other (Comment), resp. rate 18, height  (1.727 m), weight 71 kg (156 lb 8.4 oz), SpO2 100 %.  PHYSICAL EXAMINATION:  GENERAL:  55 y.o.-year-old patient lying in the bed ,critically ill, on vent.  EYES: Pupils equal, round, reactive to light , No scleral icterus. Extraocular muscles intact.  HEENT: Head atraumatic, normocephalic. Orally intubated. NECK:  Supple, no jugular venous distention. No thyroid enlargement, no tenderness.  LUNGS: Normal breath sounds bilaterally, no wheezing, rales,rhonchi or crepitation. No use of accessory muscles of respiration.  CARDIOVASCULAR: S1, S2 normal. No murmurs, rubs, or gallops.  ABDOMEN: Soft, nontender, nondistended. Bowel sounds present. No organomegaly or mass.  EXTREMITIES: No pedal edema, cyanosis, or clubbing.  NEUROLOGIC: unable to do full neuro exam due to intubation and sedation. PSYCHIATRIC: intubated/sedated. SKIN: No obvious rash, lesion, or ulcer.    LABORATORY PANEL:   CBC  Recent Labs Lab 01/24/16 0630  WBC 5.9  HGB 9.6*  HCT 29.5*  PLT 134*   ------------------------------------------------------------------------------------------------------------------  Chemistries   Recent Labs Lab 01/22/16 0451  01/24/16 0630  NA 146*  < > 142  K 3.5  < > 3.6  CL 112*  < > 110  CO2 31   < > 28  GLUCOSE 98  < > 125*  BUN 16  < > 13  CREATININE 0.97  < > 0.92  CALCIUM 8.0*  < > 8.0*  MG 1.8  --  1.8  AST 24  --   --   ALT 26  --   --   ALKPHOS 78  --   --   BILITOT 0.8  --   --   < > = values in this interval not displayed. ------------------------------------------------------------------------------------------------------------------  Cardiac Enzymes  Recent Labs Lab 01/21/16 0257  TROPONINI <0.03   ------------------------------------------------------------------------------------------------------------------  RADIOLOGY:  No results found.  EKG:   Orders placed or performed during the hospital encounter of 01/21/16  . ED EKG  . ED EKG  . EKG 12-Lead  . EKG 12-Lead    ASSESSMENT AND PLAN:   1.acute respiratory failure ;due to Drug Overdose and alcohol intoxication; continue full vent support, weaning trials as per pulmonary and critical care.   Possible aspiration PNA,  follow blood cultures, Continue Unasyn IV.  2.cardiac arrest with hypothermia/very brief CPR;now hypothermia and bradycardia are resolved.  3.Metabolic acidosis ;improved with  Bicarb given in Er and IV  Fluids.  4.possible suicidal attempt;according to sister ,pt is talking about suicidal iseation recently;needs psych consult after extubation and sitter.  5.hypotension;likley due to sedation;continue iv fluid.   6.h/o ETOH abuse;continue Thiamine and folic acid, Watch for withdrawal.  7 nutrition: continue ube feedings.  Condition  critical high risk for cardiac arrest. All the records are reviewed and case discussed with Care Management/Social Workerr. Management plans discussed with the patient, family  and they are in agreement.  CODE STATUS: full  TOTAL CRITICAL TIME TAKING CARE OF THIS PATIENT: 42  minutes. CCT  POSSIBLE D/C IN  >3DAYS, DEPENDING ON CLINICAL CONDITION.   Shaune Pollack M.D on 01/24/2016 at 8:34 AM  Between 7am to 6pm - Pager - [phone]  After  6pm go to [email or web address] - password [email or web address] [facility]  [place] [facility]  Office  [phone]  CC: Primary care physician; No PCP Per Patient   Note: This dictation was prepared with Dragon dictation along with smaller phrase technology. Any transcriptional errors that result from this process are unintentional.

## 2016-01-24 NOTE — Clinical Social Work Note (Signed)
CSW consulted for substance abuse. Patient allegedly admitted to hospital from a hotel room where he was found unresponsive. Patient had marijuana and cocaine in his system. Patient currently on ventilator. There are two family members listed and CSW will attempt to reach a family member.  York Spaniel MSW,LCSW [phone]

## 2016-01-24 NOTE — Consult Note (Signed)
PULMONARY / CRITICAL CARE MEDICINE   Name: Duane Hayes MRN: [id number] DOB: 1961-02-05    ADMISSION DATE:  01/21/2016 CONSULTATION DATE:  01/21/16  REFERRING MD :  Dr. Enedina Finner   CHIEF COMPLAINT:     Unresponsive Reason for consult - ventilator and critical care management.    HISTORY OF PRESENT ILLNESS   patient intubated and sedated.   elevated EtOH level, Urine tox positive for amphetamines, cannabis, TCAs.  Patient failed SAT/SBt yesterday due to encephlopathy and hypoxia ECHO pending  ICU day#3  SIGNIFICANT EVENTS  1/27 - found unresponsive,  given narcan,severely agitated, then loss of pulse, short course of CPR, intubated, but severely agitated,extubated, could not protect airway, reintubated and transferred to the ICU   Review of Systems  Unable to perform ROS: intubated      VITAL SIGNS    Temp:  [98.2 F (36.8 C)-101.7 F (38.7 C)] 98.2 F (36.8 C) (01/30 0808) Pulse Rate:  [63-99] 64 (01/30 0808) Resp:  [17-20] 18 (01/30 0808) BP: (81-113)/(49-67) 91/60 mmHg (01/30 0800) SpO2:  [91 %-100 %] 100 % (01/30 0808) FiO2 (%):  [30 %] 30 % (01/30 0400) Weight:  [156 lb 8.4 oz (71 kg)] 156 lb 8.4 oz (71 kg) (01/30 0400) HEMODYNAMICS:   VENTILATOR SETTINGS: Vent Mode:  [-] PRVC FiO2 (%):  [30 %] 30 % Set Rate:  [18 bmp] 18 bmp Vt Set:  [450 mL] 450 mL PEEP:  [5 cmH20] 5 cmH20 INTAKE / OUTPUT:  Intake/Output Summary (Last 24 hours) at 01/24/16 0822 Last data filed at 01/24/16 0818  Gross per 24 hour  Intake 1904.36 ml  Output   1360 ml  Net 544.36 ml       PHYSICAL EXAM   Physical Exam  HENT:  Head: Normocephalic.  Eyes: Conjunctivae are normal. Pupils are equal, round, and reactive to light.  Neck: Neck supple.  Cardiovascular: Normal rate, regular rhythm, normal heart sounds and intact distal pulses.   No murmur heard. Pulmonary/Chest: He has no wheezes. He has no rales.  Abdominal: Soft. Bowel sounds are normal.   Musculoskeletal: He exhibits no edema.  Neurological:  Intubated and sedated.  GCS<8T  Skin: Skin is warm and dry. No erythema.  Nursing note and vitals reviewed.      LABS   LABS:  CBC  Recent Labs Lab 01/21/16 1348 01/22/16 0451 01/24/16 0630  WBC 10.4 9.1 5.9  HGB 12.4* 11.5* 9.6*  HCT 36.9* 34.7* 29.5*  PLT 211 163 134*   Coag's No results for input(s): APTT, INR in the last 168 hours. BMET  Recent Labs Lab 01/22/16 0451 01/23/16 0420 01/24/16 0630  NA 146* 144 142  K 3.5 3.7 3.6  CL 112* 110 110  CO2 BUN CREATININE 0.97 1.04 0.92  GLUCOSE 98 107* 125*   Electrolytes  Recent Labs Lab 01/21/16 0257  01/22/16 0451 01/23/16 0420 01/24/16 0630  CALCIUM 8.2*  < > 8.0* 8.1* 8.0*  MG 2.7*  --  1.8  --  1.8  PHOS  --   --  2.7  --  3.6  < > = values in this interval not displayed. Sepsis Markers No results for input(s): LATICACIDVEN, PROCALCITON, O2SATVEN in the last 168 hours. ABG  Recent Labs Lab 01/21/16 0355 01/21/16 0930  PHART 7.10* 7.37  PCO2ART 48 47  PO2ART 78* 108   Liver Enzymes  Recent Labs Lab 01/21/16 0257 01/21/16 1348 01/22/16 0451  AST 38 36  24  ALT 37 32 26  ALKPHOS 113 84 78  BILITOT 0.8 1.0 0.8  ALBUMIN 4.1 3.2* 2.8*   Cardiac Enzymes  Recent Labs Lab 01/21/16 0257  TROPONINI <0.03   Glucose  Recent Labs Lab 01/23/16 0758 01/23/16 1139 01/23/16 1540 01/23/16 1951 01/23/16 2350 01/24/16 0424  GLUCAP 119* 108* 122* 120* 136* 103*     Recent Results (from the past 240 hour(s))  Blood culture (routine x 2)     Status: None (Preliminary result)   Collection Time: 01/21/16  4:20 AM  Result Value Ref Range Status   Specimen Description BLOOD RIGHT ANTECUBITAL  Final   Special Requests BOTTLES DRAWN AEROBIC AND ANAEROBIC  Final   Culture NO GROWTH 2 DAYS  Final   Report Status PENDING  Incomplete  Blood culture (routine x 2)     Status: None (Preliminary result)    Collection Time: 01/21/16  4:20 AM  Result Value Ref Range Status   Specimen Description BLOOD LEFT WRIST  Final   Special Requests BOTTLES DRAWN AEROBIC AND ANAEROBIC  Final   Culture NO GROWTH 2 DAYS  Final   Report Status PENDING  Incomplete  MRSA PCR Screening     Status: None   Collection Time: 01/21/16  8:51 AM  Result Value Ref Range Status   MRSA by PCR NEGATIVE NEGATIVE Final    Comment:        The GeneXpert MRSA Assay (FDA approved for NASAL specimens only), is one component of a comprehensive MRSA colonization surveillance program. It is not intended to diagnose MRSA infection nor to guide or monitor treatment for MRSA infections.      Current facility-administered medications:  .  acetaminophen (TYLENOL) tablet 650 mg, 650 mg, Oral, Q6H PRN, 650 mg at 01/23/16 2202 **OR** acetaminophen (TYLENOL) suppository 650 mg, 650 mg, Rectal, Q6H PRN, Arnaldo Natal, MD .  Ampicillin-Sulbactam (UNASYN) 3 g in sodium chloride 0.9 % 100 mL IVPB, 3 g, Intravenous, Q6H, Bertram Savin, RPH, 3 g at 01/24/16 0433 .  antiseptic oral rinse solution (CORINZ), 7 mL, Mouth Rinse, QID, Katha Hamming, MD, 7 mL at 01/24/16 0429 .  chlorhexidine gluconate (PERIDEX) 0.12 % solution 15 mL, 15 mL, Mouth Rinse, BID, Katha Hamming, MD, 15 mL at 01/23/16 2202 .  dextrose 5 % solution, , Intravenous, Continuous, Erin Fulling, MD, Last Rate: 10 mL/hr at 01/24/16 0700 .  sennosides (SENOKOT) 8.8 MG/5ML syrup 5 mL, 5 mL, Per Tube, BID, 5 mL at 01/23/16 2202 **AND** docusate (COLACE) 50 MG/5ML liquid 100 mg, 100 mg, Per Tube, BID, Katha Hamming, MD, 100 mg at 01/23/16 2202 .  feeding supplement (VITAL HIGH PROTEIN) liquid 1,000 mL, 1,000 mL, Per Tube, Continuous, Erin Fulling, MD, Last Rate: 60 mL/hr at 01/24/16 0700, 1,000 mL at 01/24/16 0700 .  fentaNYL (SUBLIMAZE) bolus via infusion 50 mcg, 50 mcg, Intravenous, Q1H PRN, Erin Fulling, MD .  fentaNYL in NS (72mcg/ml)  infusion-PREMIX, 25-400 mcg/hr, Intravenous, Continuous, Erin Fulling, MD, Last Rate: 15 mL/hr at 01/24/16 0700, 150 mcg/hr at 01/24/16 0700 .  folic acid 1 mg in sodium chloride 0.9% 50 mL PB, , Intravenous, Daily, Katha Hamming, MD, Stopped at 01/23/16 1900 .  free water 200 mL, 200 mL, Per Tube, 3 times per day, Erin Fulling, MD, 200 mL at 01/24/16 0606 .  heparin injection 5,000 Units, 5,000 Units, Subcutaneous, 3 times per day, Arnaldo Natal, MD, 5,000 Units at 01/24/16 [phone] .  insulin aspart (  novoLOG) injection 2-6 Units, 2-6 Units, Subcutaneous, 6 times per day, Bertram Savin, [facility], 2 Units at 01/24/16 0040 .  midazolam (VERSED) 50 mg in sodium chloride 0.9 % 50 mL (1 mg/mL) infusion, 1 mg/hr, Intravenous, Continuous, Loleta Rose, MD, Stopped at 01/22/16 [phone] .  ondansetron (ZOFRAN) tablet 4 mg, 4 mg, Oral, Q6H PRN **OR** ondansetron (ZOFRAN) injection 4 mg, 4 mg, Intravenous, Q6H PRN, Arnaldo Natal, MD .  pantoprazole (PROTONIX) injection 40 mg, 40 mg, Intravenous, Q24H, Vishal Mungal, MD, 40 mg at 01/23/16 1148 .  propofol (DIPRIVAN) 1000 MG/100ML infusion, 0-50 mcg/kg/min, Intravenous, Continuous, Erin Fulling, MD, Last Rate: 10.5 mL/hr at 01/24/16 0700, 25.108 mcg/kg/min at 01/24/16 0700 .  sodium chloride flush (NS) 0.9 % injection 3 mL, 3 mL, Intravenous, Q12H, Arnaldo Natal, MD, 3 mL at 01/23/16 2204 .  thiamine (B-1) injection 100 mg, 100 mg, Intravenous, Daily, Vishal Mungal, MD, 200 mg at 01/23/16 1030    Indwelling Urinary Catheter continued, requirement due to   Reason to continue Indwelling Urinary Catheter for strict Intake/Output monitoring for hemodynamic instability         Ventilator continued, requirement due to, resp failure    Ventilator Sedation RASS 0 to -2   Cultures: BCx2  UC  Sputum  Antibiotics: Unasyn 1/27>>     ASSESSMENT/PLAN  55 yo male found unresponsive in the field, received narcan, had CPR for 2 minutes, intubated,  transferred to ICU for further management and care. Urine tox positive for tricyclics, cocaine, amphetamines; alcohol level greater than 100. Acute resp failure and acute encephalopathy from acute cocaine abuse  PULMONARY Acute respiratory failure Metabolic acidosis Drug overdose/polysubstance abuse P:   -continue mechanical ventilation, wean as tolerated -Maintain O2 saturations greater than 88% -Continue with sedation/analgesia regiment, perform SBT/SAT daily -ABG as needed -failed SAT/SBt due to hypoxia and encephlopathy Will continue to attempt as tolerated daily  CARDIOVASCULAR -continue with hemodynamic monitoring  RENAL Metabolic Acidosis - cont with IVFs - ABG as needed.  - ICU electrolyte protocol  GASTROINTESTINAL SUP - PPI EtOH sue - monitor EtOH level - cont with IVF, thiamine, Folic Acid and MVI  HEMATOLOGIC -follow cbc  INFECTIOUS - currently on unasyn for possible aspiration  ENDOCRINE -ICU hypo/hyperglycemic protocol  NEUROLOGIC RASS goal: 1 - sedation/analgesia wean as tolerated.    I have personally obtained a history, examined the patient, evaluated laboratory and imaging results, formulated the assessment and plan and placed orders.  The Patient requires high complexity decision making for assessment and support, frequent evaluation and titration of therapies, application of advanced monitoring technologies and extensive interpretation of multiple databases. Critical Care Time devoted to patient care services described in this note is 35 minutes.   Overall, patient is critically ill, prognosis is guarded.   Lucie Leather, M.D.  [facility]  Medical Director [facility] Medical Director [facility] Cardio-Pulmonary Department

## 2016-01-25 ENCOUNTER — Inpatient Hospital Stay: Payer: Self-pay

## 2016-01-25 DIAGNOSIS — L8990 Pressure ulcer of unspecified site, unspecified stage: Secondary | ICD-10-CM

## 2016-01-25 LAB — EXPECTORATED SPUTUM ASSESSMENT W REFEX TO RESP CULTURE

## 2016-01-25 LAB — BASIC METABOLIC PANEL
Anion gap: 3 — ABNORMAL LOW (ref 5–15)
BUN: 12 mg/dL (ref 6–20)
CHLORIDE: 104 mmol/L (ref 101–111)
CO2: 29 mmol/L (ref 22–32)
CREATININE: 0.71 mg/dL (ref 0.61–1.24)
Calcium: 7.8 mg/dL — ABNORMAL LOW (ref 8.9–10.3)
GFR calc non Af Amer: >60 mL/min (ref >60)
Glucose, Bld: 102 mg/dL — ABNORMAL HIGH (ref 65–99)
Potassium: 3.9 mmol/L (ref 3.5–5.1)
Sodium: 136 mmol/L (ref 135–145)

## 2016-01-25 LAB — GLUCOSE, CAPILLARY
Glucose-Capillary: 109 mg/dL — ABNORMAL HIGH (ref 65–99)
Glucose-Capillary: 118 mg/dL — ABNORMAL HIGH (ref 65–99)
Glucose-Capillary: 128 mg/dL — ABNORMAL HIGH (ref 65–99)
Glucose-Capillary: 149 mg/dL — ABNORMAL HIGH (ref 65–99)
Glucose-Capillary: 157 mg/dL — ABNORMAL HIGH (ref 65–99)
Glucose-Capillary: 77 mg/dL (ref 65–99)

## 2016-01-25 LAB — CBC
HEMATOCRIT: 30.6 % — AB (ref 40.0–52.0)
HEMOGLOBIN: 10.1 g/dL — AB (ref 13.0–18.0)
MCH: 31.8 pg (ref 26.0–34.0)
MCHC: 33 g/dL (ref 32.0–36.0)
MCV: 96.2 fL (ref 80.0–100.0)
PLATELETS: 190 10*3/uL (ref 150–440)
RBC: 3.19 MIL/uL — AB (ref 4.40–5.90)
RDW: 14.4 % (ref 11.5–14.5)
WBC: 5.7 10*3/uL (ref 3.8–10.6)

## 2016-01-25 LAB — URINE CULTURE
Culture: NO GROWTH
SPECIAL REQUESTS: NORMAL

## 2016-01-25 LAB — MAGNESIUM: Magnesium: 2 mg/dL (ref 1.7–2.4)

## 2016-01-25 LAB — TRIGLYCERIDES: TRIGLYCERIDES: 106 mg/dL (ref <150)

## 2016-01-25 LAB — EXPECTORATED SPUTUM ASSESSMENT W GRAM STAIN, RFLX TO RESP C

## 2016-01-25 MED ORDER — PROPOFOL 1000 MG/100ML IV EMUL
INTRAVENOUS | Status: AC
  Administered 2016-01-25: 20 ug/kg/min via INTRAVENOUS
  Filled 2016-01-25: qty 100

## 2016-01-25 MED ORDER — POLYETHYLENE GLYCOL 3350 17 G PO PACK
17.0000 g | PACK | Freq: Every day | ORAL | Status: DC
  Administered 2016-01-25: 17 g via ORAL
  Filled 2016-01-25: qty 1

## 2016-01-25 MED ORDER — PROPOFOL 1000 MG/100ML IV EMUL
5.0000 ug/kg/min | INTRAVENOUS | Status: DC
  Administered 2016-01-25: 20 ug/kg/min via INTRAVENOUS

## 2016-01-25 MED ORDER — DEXMEDETOMIDINE HCL IN NACL 400 MCG/100ML IV SOLN
0.4000 ug/kg/h | INTRAVENOUS | Status: DC
  Administered 2016-01-25: 0.4 ug/kg/h via INTRAVENOUS
  Administered 2016-01-26: 1 ug/kg/h via INTRAVENOUS
  Administered 2016-01-26 – 2016-01-27 (×4): 1.2 ug/kg/h via INTRAVENOUS
  Administered 2016-01-28: 2 ug/kg/h via INTRAVENOUS
  Administered 2016-01-28: 1.4 ug/kg/h via INTRAVENOUS
  Administered 2016-01-28: 1.6 ug/kg/h via INTRAVENOUS
  Administered 2016-01-28 (×4): 2 ug/kg/h via INTRAVENOUS
  Administered 2016-01-28: 1.7 ug/kg/h via INTRAVENOUS
  Administered 2016-01-29: 1.4 ug/kg/h via INTRAVENOUS
  Administered 2016-01-29: 2 ug/kg/h via INTRAVENOUS
  Administered 2016-01-29: 1.8 ug/kg/h via INTRAVENOUS
  Administered 2016-01-29: 1.4 ug/kg/h via INTRAVENOUS
  Administered 2016-01-29: 2 ug/kg/h via INTRAVENOUS
  Administered 2016-01-29: 1.4 ug/kg/h via INTRAVENOUS
  Administered 2016-01-29: 2 ug/kg/h via INTRAVENOUS
  Administered 2016-01-30: 0.8 ug/kg/h via INTRAVENOUS
  Administered 2016-01-30: 1 ug/kg/h via INTRAVENOUS
  Administered 2016-01-30: 1.4 ug/kg/h via INTRAVENOUS
  Administered 2016-01-30: 1.3 ug/kg/h via INTRAVENOUS
  Administered 2016-01-30: 1.4 ug/kg/h via INTRAVENOUS
  Administered 2016-01-31: 0.8 ug/kg/h via INTRAVENOUS
  Filled 2016-01-25 (×29): qty 100

## 2016-01-25 NOTE — Progress Notes (Addendum)
[facility] Physicians - [place] at [facility]   PATIENT NAME: Duane Hayes    MR#:  [id number]  DATE OF BIRTH:  1961/03/26  Admitted for altered mental status, respiratory failure, possible drug overdose.  Not Tolerating the weaning trials yet . has low-grade temperature 100F today.   CHIEF COMPLAINT:   Chief Complaint  Patient presents with  . Drug Overdose  on vent and sedation. Opened eyes on stimuli.  REVIEW OF SYSTEMS:   Review of Systems  Unable to perform ROS: intubated     DRUG ALLERGIES:  No Known Allergies  VITALS:  Blood pressure 101/57, pulse 73, temperature 98.4 F (36.9 C), temperature source Oral, resp. rate 18, height  (1.727 m), weight 71 kg (156 lb 8.4 oz), SpO2 100 %.  PHYSICAL EXAMINATION:  GENERAL:  55 y.o.-year-old patient lying in the bed ,critically ill, on vent.  EYES: Pupils equal, round, reactive to light , No scleral icterus. Extraocular muscles intact.  HEENT: Head atraumatic, normocephalic. Orally intubated. NECK:  Supple, no jugular venous distention. No thyroid enlargement, no tenderness.  LUNGS: Normal breath sounds bilaterally, no wheezing, rales,rhonchi or crepitation. No use of accessory muscles of respiration.  CARDIOVASCULAR: S1, S2 normal. No murmurs, rubs, or gallops.  ABDOMEN: Soft, nontender, nondistended. Bowel sounds present. No organomegaly or mass.  EXTREMITIES: No pedal edema, cyanosis, or clubbing.  NEUROLOGIC: unable to do full neuro exam due to intubation and sedation. PSYCHIATRIC: intubated/sedated. Opened eyes on stimuli. SKIN: No obvious rash, lesion, or ulcer.    LABORATORY PANEL:   CBC  Recent Labs Lab 01/25/16 0516  WBC 5.7  HGB 10.1*  HCT 30.6*  PLT 190   ------------------------------------------------------------------------------------------------------------------  Chemistries   Recent Labs Lab 01/22/16 0451  01/25/16 0516  NA 146*  < > 136  K 3.5  < > 3.9  CL 112*  < >  104  CO2 31  < > 29  GLUCOSE 98  < > 102*  BUN 16  < > 12  CREATININE 0.97  < > 0.71  CALCIUM 8.0*  < > 7.8*  MG 1.8  < > 2.0  AST 24  --   --   ALT 26  --   --   ALKPHOS 78  --   --   BILITOT 0.8  --   --   < > = values in this interval not displayed. ------------------------------------------------------------------------------------------------------------------  Cardiac Enzymes  Recent Labs Lab 01/21/16 0257  TROPONINI <0.03   ------------------------------------------------------------------------------------------------------------------  RADIOLOGY:  No results found.  EKG:   Orders placed or performed during the hospital encounter of 01/21/16  . ED EKG  . ED EKG  . EKG 12-Lead  . EKG 12-Lead    ASSESSMENT AND PLAN:   1.acute respiratory failure ;due to Drug Overdose and alcohol intoxication; continue full vent support, weaning trials as per pulmonary and critical care.   Possible aspiration PNA,  follow blood cultures, so far negative.  Continue Unasyn IV.  2.cardiac arrest with hypothermia/very brief CPR; hypothermia and bradycardia are resolved.  3.Metabolic acidosis ;improved with  Bicarb given in Er and IV  Fluids.  4.possible suicidal attempt;according to sister ,pt is talking about suicidal iseation recently;needs psych consult after extubation and sitter.  5.hypotension;likley due to sedation;continue iv fluid.    6.h/o ETOH abuse;continue Thiamine and folic acid, Watch for withdrawal.  7 nutrition: continue ube feedings.  I discussed with Dr. Nicholos Johns. Condition  critical high risk for cardiac arrest. All the records are reviewed and  case discussed with Care Management/Social Workerr. Management plans discussed with the patient, sisters and they are in agreement.  CODE STATUS: full  TOTAL CRITICAL TIME TAKING CARE OF THIS PATIENT: 38  minutes. CCT  POSSIBLE D/C IN  >3DAYS, DEPENDING ON CLINICAL CONDITION.   Shaune Pollack M.D on  01/25/2016 at 8:48 AM  Between 7am to 6pm - Pager - [phone]  After 6pm go to [email or web address] - password [email or web address] [facility]  [facility]  Office  [phone]  CC: Primary care physician; No PCP Per Patient   Note: This dictation was prepared with Dragon dictation along with smaller phrase technology. Any transcriptional errors that result from this process are unintentional.

## 2016-01-25 NOTE — Progress Notes (Signed)
[facility] Critical Care Medicine Progess Note    ASSESSMENT/PLAN   55 yo male found unresponsive in the field, received narcan, had CPR for 2 minutes, intubated, transferred to ICU for further management and care. Urine tox positive for tricyclics, cocaine, amphetamines; alcohol level greater than 100. Acute resp failure and acute encephalopathy from acute cocaine abuse  PULMONARY Acute respiratory failure Metabolic acidosis Drug overdose/polysubstance abuse P:  -Patients appears to be hypoventilating on weaning trial this morning and apneic at time. Will place back on vent and reduce respiratory rate from 18 to 10.  -Maintain O2 saturations greater than 88% -Minimize sedation. perform SBT/SAT daily   CARDIOVASCULAR -continue with hemodynamic monitoring  RENAL Metabolic Acidosis - cont with IVFs - ABG as needed.  - ICU electrolyte protocol  GASTROINTESTINAL SUP - PPI EtOH sue - monitor EtOH level - cont with IVF, thiamine, Folic Acid and MVI  HEMATOLOGIC -follow cbc  INFECTIOUS - currently on unasyn for possible aspiration  ENDOCRINE -ICU hypo/hyperglycemic protocol  NEUROLOGIC RASS goal: 1 - sedation/analgesia wean as tolerated.   ---------------------------------------   ----------------------------------------   Name: Duane Hayes MRN: [id number] DOB: 07-Dec-1961    ADMISSION DATE:  01/21/2016    CHIEF COMPLAINT: Dyspnea.    SUBJECTIVE:   Pt currently on the ventilator, can not provide history or review of systems.    VITAL SIGNS: Temp:  [98.2 F (36.8 C)-99.5 F (37.5 C)] 98.2 F (36.8 C) (01/31 0900) Pulse Rate:  [54-107] 54 (01/31 0900) Resp:  [5-19] 11 (01/31 0900) BP: (93-153)/(55-82) 125/73 mmHg (01/31 0900) SpO2:  [95 %-100 %] 97 % (01/31 0900) FiO2 (%):  [30 %] 30 % (01/31 0900) Weight:  [156 lb 8.4 oz (71 kg)] 156 lb 8.4 oz (71 kg) (01/31 0500) HEMODYNAMICS:   VENTILATOR SETTINGS: Vent Mode:  [-] PRVC FiO2 (%):   [30 %] 30 % Set Rate:  [18 bmp] 18 bmp Vt Set:  [450 mL] 450 mL PEEP:  [5 cmH20] 5 cmH20 INTAKE / OUTPUT:  Intake/Output Summary (Last 24 hours) at 01/25/16 0937 Last data filed at 01/25/16 1324  Gross per 24 hour  Intake 2897.5 ml  Output   1680 ml  Net 1217.5 ml    PHYSICAL EXAMINATION: Physical Examination:   VS: BP 125/73 mmHg  Pulse 54  Temp(Src) 98.2 F (36.8 C) (Oral)  Resp 11  Ht  (1.727 m)  Wt 156 lb 8.4 oz (71 kg)  BMI 23.81 kg/m2  SpO2 97%  General Appearance: No distress  Neuro:without focal findings, mental status normal. HEENT: PERRLA, EOM intact. Pulmonary: normal breath sounds   CardiovascularNormal S1,S2.  No m/r/g.   Abdomen: Benign, Soft, non-tender. Renal:  No costovertebral tenderness  GU:  Not performed at this time. Endocrine: No evident thyromegaly. Skin:   warm, no rashes, no ecchymosis  Extremities: normal, no cyanosis, clubbing.   LABS:   LABORATORY PANEL:   CBC  Recent Labs Lab 01/25/16 0516  WBC 5.7  HGB 10.1*  HCT 30.6*  PLT 190    Chemistries   Recent Labs Lab 01/22/16 0451  01/24/16 0630 01/25/16 0516  NA 146*  < > 142 136  K 3.5  < > 3.6 3.9  CL 112*  < > 110 104  CO2 31  < > 28 29  GLUCOSE 98  < > 125* 102*  BUN 16  < > 13 12  CREATININE 0.97  < > 0.92 0.71  CALCIUM 8.0*  < > 8.0* 7.8*  MG 1.8  --  1.8 2.0  PHOS 2.7  --  3.6  --   AST 24  --   --   --   ALT 26  --   --   --   ALKPHOS 78  --   --   --   BILITOT 0.8  --   --   --   < > = values in this interval not displayed.   Recent Labs Lab 01/24/16 1119 01/24/16 1617 01/24/16 1952 01/25/16 0001 01/25/16 0333 01/25/16 0756  GLUCAP 114* 85 102* 128* 109* 118*    Recent Labs Lab 01/21/16 0355 01/21/16 0930  PHART 7.10* 7.37  PCO2ART 48 47  PO2ART 78* 108    Recent Labs Lab 01/21/16 0257 01/21/16 1348 01/22/16 0451  AST 38 36 24  ALT 37 32 26  ALKPHOS 113 84 78  BILITOT 0.8 1.0 0.8  ALBUMIN 4.1 3.2* 2.8*    Cardiac  Enzymes  Recent Labs Lab 01/21/16 0257  TROPONINI <0.03    RADIOLOGY:  No results found.     --Wells Guiles, MD.  [facility]  Santiago Glad, M.D.  Stephanie Acre, M.D.  Billy Fischer, M.D

## 2016-01-25 NOTE — Progress Notes (Signed)
PHARMACY - CRITICAL CARE PROGRESS NOTE  Pharmacy Consult for Unasyn (5/7), Constipation Prevention, Electrolyte Replacement and Glucose Management    No Known Allergies  Patient Measurements: Height:  (172.7 cm) Weight: 156 lb 8.4 oz (71 kg) IBW/kg (Calculated) : 68.4   Vital Signs: Temp: 98.2 F (36.8 C) (01/31 1300) Temp Source: Core (Comment) (01/31 1010) BP: 84/50 mmHg (01/31 1300) Pulse Rate: 65 (01/31 1300) Intake/Output from previous day: 01/30 0701 - 01/31 0700 In: 3328.5 [I.V.:1468.5; NG/GT:1660; IV Piggyback:200] Out: 1625 [Urine:1625] Intake/Output from this shift: Total I/O In: 624.7 [NG/GT:624.7] Out: 230 [Urine:230] Vent settings for last 24 hours: Vent Mode:  [-] PRVC FiO2 (%):  [30 %] 30 % Set Rate:  [10 bmp-18 bmp] 18 bmp Vt Set:  [450 mL] 450 mL PEEP:  [5 cmH20] 5 cmH20  Labs:  Recent Labs  01/23/16 0420 01/24/16 0630 01/25/16 0516  WBC  --  5.9 5.7  HGB  --  9.6* 10.1*  HCT  --  29.5* 30.6*  PLT  --  134* 190  CREATININE 1.04 0.92 0.71  MG  --  1.8 2.0  PHOS  --  3.6  --    Estimated Creatinine Clearance: 102.1 mL/min (by C-G formula based on Cr of 0.71).   Recent Labs  01/25/16 0333 01/25/16 0756 01/25/16 1228  GLUCAP 109* 118* 157*    Microbiology: Recent Results (from the past 720 hour(s))  Blood culture (routine x 2)     Status: None (Preliminary result)   Collection Time: 01/21/16  4:20 AM  Result Value Ref Range Status   Specimen Description BLOOD RIGHT ANTECUBITAL  Final   Special Requests BOTTLES DRAWN AEROBIC AND ANAEROBIC  Final   Culture NO GROWTH 4 DAYS  Final   Report Status PENDING  Incomplete  Blood culture (routine x 2)     Status: None (Preliminary result)   Collection Time: 01/21/16  4:20 AM  Result Value Ref Range Status   Specimen Description BLOOD LEFT WRIST  Final   Special Requests BOTTLES DRAWN AEROBIC AND ANAEROBIC  Final   Culture NO GROWTH 4 DAYS  Final   Report Status PENDING   Incomplete  MRSA PCR Screening     Status: None   Collection Time: 01/21/16  8:51 AM  Result Value Ref Range Status   MRSA by PCR NEGATIVE NEGATIVE Final    Comment:        The GeneXpert MRSA Assay (FDA approved for NASAL specimens only), is one component of a comprehensive MRSA colonization surveillance program. It is not intended to diagnose MRSA infection nor to guide or monitor treatment for MRSA infections.   Urine culture     Status: None   Collection Time: 01/23/16  6:09 PM  Result Value Ref Range Status   Specimen Description URINE, CATHETERIZED  Final   Special Requests Normal  Final   Culture NO GROWTH 1 DAY  Final   Report Status 01/25/2016 FINAL  Final    Medications:  Scheduled:  . ampicillin-sulbactam (UNASYN) IV  3 g Intravenous Q6H  . antiseptic oral rinse  7 mL Mouth Rinse QID  . chlorhexidine gluconate  15 mL Mouth Rinse BID  . diazepam  5 mg Intravenous Q6H  . feeding supplement (PRO-STAT SUGAR FREE 64)  30 mL Oral Daily  . folic acid  1 mg Oral Daily  . free water  200 mL Per Tube 3 times per day  . heparin  5,000 Units Subcutaneous 3 times per  day  . insulin aspart  2-6 Units Subcutaneous 6 times per day  . pantoprazole sodium  40 mg Per Tube Q24H  . polyethylene glycol  17 g Oral Daily  . senna-docusate  2 tablet Oral BID  . sodium chloride flush  3 mL Intravenous Q12H  . thiamine  100 mg Oral Daily   Infusions:  . dexmedetomidine 0.1 mcg/kg/hr (01/25/16 1300)  . dextrose 10 mL/hr at 01/25/16 0700  . feeding supplement (VITAL 1.5 CAL) 1,000 mL (01/25/16 1128)  . fentaNYL infusion INTRAVENOUS 150 mcg/hr (01/25/16 1345)  . propofol (DIPRIVAN) infusion Stopped (01/25/16 1131)    Assessment: Pharmacy consulted for medication management for 55 yo male ICU patient requiring mechanical ventilation.    Plan:  1. Unasyn (5/7): Will continue patient on Unasyn 3g IV Q6hr to cover possible aspiration PNA.     2. Constipation: Patient intubated and  sedated with no BM to date. Will continue senna-docusate 2 tab VT bid and add Miralax daily.   3. Electrolytes: electrolytes are within normal limits. Will obtain follow-up electrolytes with am labs.    4. Glucose Management: Continue SSI coverage.    Pharmacy will continue to monitor and adjust per consult.    Luisa Hart D 01/25/2016,2:45 PM

## 2016-01-25 NOTE — Progress Notes (Signed)
During Wake up assessment, patient became agitated so RN gave prn ativan. Patient worsened and became combative. MD present at beside, ordered wake up assessment to end and patient to have rate increased back to 18 (rate had been lowered to 10 by MD earlier). MD ordered propofol to help calm patient and for that to be transitioned to precedex this morning after calm. MD also ordered PICC line placement due to access.

## 2016-01-26 DIAGNOSIS — F14221 Cocaine dependence with intoxication delirium: Secondary | ICD-10-CM

## 2016-01-26 DIAGNOSIS — F10231 Alcohol dependence with withdrawal delirium: Secondary | ICD-10-CM

## 2016-01-26 LAB — BASIC METABOLIC PANEL
ANION GAP: 7 (ref 5–15)
BUN: 17 mg/dL (ref 6–20)
CALCIUM: 8.3 mg/dL — AB (ref 8.9–10.3)
CHLORIDE: 105 mmol/L (ref 101–111)
CO2: 30 mmol/L (ref 22–32)
CREATININE: 0.81 mg/dL (ref 0.61–1.24)
GFR calc non Af Amer: >60 mL/min (ref >60)
Glucose, Bld: 119 mg/dL — ABNORMAL HIGH (ref 65–99)
Potassium: 3.6 mmol/L (ref 3.5–5.1)
SODIUM: 142 mmol/L (ref 135–145)

## 2016-01-26 LAB — GLUCOSE, CAPILLARY
Glucose-Capillary: 108 mg/dL — ABNORMAL HIGH (ref 65–99)
Glucose-Capillary: 108 mg/dL — ABNORMAL HIGH (ref 65–99)
Glucose-Capillary: 132 mg/dL — ABNORMAL HIGH (ref 65–99)
Glucose-Capillary: 149 mg/dL — ABNORMAL HIGH (ref 65–99)
Glucose-Capillary: 161 mg/dL — ABNORMAL HIGH (ref 65–99)
Glucose-Capillary: 176 mg/dL — ABNORMAL HIGH (ref 65–99)

## 2016-01-26 LAB — TRIGLYCERIDES: Triglycerides: 60 mg/dL (ref <150)

## 2016-01-26 MED ORDER — POLYETHYLENE GLYCOL 3350 17 G PO PACK
17.0000 g | PACK | Freq: Two times a day (BID) | ORAL | Status: DC
  Administered 2016-01-30: 17 g via ORAL
  Filled 2016-01-26: qty 1

## 2016-01-26 MED ORDER — CETYLPYRIDINIUM CHLORIDE 0.05 % MT LIQD
7.0000 mL | Freq: Two times a day (BID) | OROMUCOSAL | Status: DC
  Administered 2016-01-27: 7 mL via OROMUCOSAL

## 2016-01-26 NOTE — Progress Notes (Signed)
Patient extubated per Dr. Ardyth Man. Patient extubated with me and respiratory Angie at bedside.  Patient tolerated well.  Patient now on 2 liters Nasal cannula- sating 93%.  Patient resting at this time- still lethargic- Per Dr. Ardyth Man try to wean off precedex drip.

## 2016-01-26 NOTE — Progress Notes (Signed)
[facility]  Critical Care Medicine Progess Note    ASSESSMENT/PLAN   55 yo male found unresponsive in the field, received narcan, had CPR for 2 minutes, intubated, transferred to ICU for further management and care. Urine tox positive for tricyclics, cocaine, amphetamines; alcohol level greater than 100. Acute resp failure and acute encephalopathy from acute cocaine abuse  NEUROLOGIC RASS goal: 1 - sedation/analgesia wean as tolerated.  -Now with the delirium due to alcohol withdrawal. -Continue and alcohol withdrawal protocol. Currently on Ativan and Valium per protocol. Will continue. Discussed with critical care RN, ICU pharmacist. Ativan dose was adjusted upwards due to continued delirium.  PULMONARY Acute respiratory failure Metabolic acidosis Drug overdose/polysubstance abuse Extubated on 01/26/2016. P:  -Patient was extubated yesterday, in terms of his respiratory status continues to to be compensated.. -Maintain O2 saturations greater than 88%    CARDIOVASCULAR -continue with hemodynamic monitoring  RENAL Metabolic Acidosis - cont with IVFs - ABG as needed.  - ICU electrolyte protocol  GASTROINTESTINAL SUP - PPI EtOH sue - monitor EtOH level - cont with IVF, thiamine, Folic Acid and MVI  HEMATOLOGIC -follow cbc  INFECTIOUS - currently on unasyn for possible aspiration  ENDOCRINE -ICU hypo/hyperglycemic protocol    ---------------------------------------   ----------------------------------------   Name: Duane Hayes MRN: [id number] DOB: [birth year]    ADMISSION DATE:  01/21/2016    CHIEF COMPLAINT: Dyspnea.    SUBJECTIVE:   Pt currently has delirium can not provide history or review of systems.    VITAL SIGNS: Temp:  [93.6 F (34.2 C)-100.2 F (37.9 C)] 99.3 F (37.4 C) (02/01 0800) Pulse Rate:  [61-124] 121 (02/01 0800) Resp:  [14-22] 18 (02/01 0800) BP: (84-150)/(50-85) 150/85 mmHg (02/01 0800) SpO2:  [91 %-98 %] 97 %  (02/01 0800) FiO2 (%):  [30 %] 30 % (02/01 0848) Weight:  [156 lb 4.9 oz (70.9 kg)] 156 lb 4.9 oz (70.9 kg) (02/01 0500) HEMODYNAMICS:   VENTILATOR SETTINGS: Vent Mode:  [-] PSV FiO2 (%):  [30 %] 30 % Set Rate:  [10 bmp-18 bmp] 18 bmp Vt Set:  [450 mL] 450 mL PEEP:  [5 cmH20] 5 cmH20 Pressure Support:  [5 cmH20] 5 cmH20 Plateau Pressure:  [15 cmH20] 15 cmH20 INTAKE / OUTPUT:  Intake/Output Summary (Last 24 hours) at 01/26/16 0909 Last data filed at 01/26/16 0730  Gross per 24 hour  Intake 2642.1 ml  Output    475 ml  Net 2167.1 ml    PHYSICAL EXAMINATION: Physical Examination:   VS: BP 150/85 mmHg  Pulse 121  Temp(Src) 99.3 F (37.4 C) (Core (Comment))  Resp 18  Ht  (1.727 m)  Wt 156 lb 4.9 oz (70.9 kg)  BMI 23.77 kg/m2  SpO2 97%  General Appearance: No distress  Neuro:without focal findings, mental status normal. HEENT: PERRLA, EOM intact. Pulmonary: normal breath sounds   CardiovascularNormal S1,S2.  No m/r/g.   Abdomen: Benign, Soft, non-tender. Renal:  No costovertebral tenderness  GU:  Not performed at this time. Endocrine: No evident thyromegaly. Skin:   warm, no rashes, no ecchymosis  Extremities: normal, no cyanosis, clubbing.   LABS:   LABORATORY PANEL:   CBC  Recent Labs Lab 01/25/16 0516  WBC 5.7  HGB 10.1*  HCT 30.6*  PLT 190    Chemistries   Recent Labs Lab 01/22/16 0451  01/24/16 0630 01/25/16 0516 01/26/16 0428  NA 146*  < > 142 136 142  K 3.5  < > 3.6 3.9 3.6  CL 112*  < >  110 104 105  CO2 31  < > GLUCOSE 98  < > 125* 102* 119*  BUN 16  < > CREATININE 0.97  < > 0.92 0.71 0.81  CALCIUM 8.0*  < > 8.0* 7.8* 8.3*  MG 1.8  --  1.8 2.0  --   PHOS 2.7  --  3.6  --   --   AST 24  --   --   --   --   ALT 26  --   --   --   --   ALKPHOS 78  --   --   --   --   BILITOT 0.8  --   --   --   --   < > = values in this interval not displayed.   Recent Labs Lab 01/25/16 1228 01/25/16 1557  01/25/16 1933 01/25/16 2355 01/26/16 0350 01/26/16 0745  GLUCAP 157* 77 149* 149* 161* 176*    Recent Labs Lab 01/21/16 0355 01/21/16 0930  PHART 7.10* 7.37  PCO2ART 48 47  PO2ART 78* 108    Recent Labs Lab 01/21/16 0257 01/21/16 1348 01/22/16 0451  AST 38 36 24  ALT 37 32 26  ALKPHOS 113 84 78  BILITOT 0.8 1.0 0.8  ALBUMIN 4.1 3.2* 2.8*    Cardiac Enzymes  Recent Labs Lab 01/21/16 0257  TROPONINI <0.03    RADIOLOGY:  Dg Chest Port 1 View  01/25/2016  CLINICAL DATA:  PICC line placement.  Intubation. EXAM: PORTABLE CHEST 1 VIEW COMPARISON:  01/21/2016. FINDINGS: Left PICC line noted with tip at cavoatrial junction. Endotracheal tube, NG tube in stable position. Cardiomegaly with bilateral pulmonary interstitial prominence and small pleural effusions consistent with congestive heart failure. Findings have progressed from prior exam. No pneumothorax. IMPRESSION: 1. Interim placement of left PICC line, its tip is at the cavoatrial junction. Endotracheal tube and NG tube in stable position. 2. Congestive heart failure with progressive pulmonary edema and bilateral pleural effusions. Electronically Signed   By: Maisie Fus  Register   On: 01/25/2016 15:57       --Wells Guiles, MD.  [facility]  Santiago Glad, M.D.  Stephanie Acre, M.D.  Billy Fischer, M.D  Critical Care Attestation.  I have personally obtained a history, examined the patient, evaluated laboratory and imaging results, formulated the assessment and plan and placed orders. The Patient requires high complexity decision making for assessment and support, frequent evaluation and titration of therapies, application of advanced monitoring technologies and extensive interpretation of multiple databases. The patient has critical illness that could lead imminently to failure of 1 or more organ systems and requires the highest level of physician preparedness to intervene.  Critical Care Time  devoted to patient care services described in this note is 35 minutes and is exclusive of time spent in procedures.

## 2016-01-26 NOTE — Progress Notes (Signed)
[facility] Critical Care Medicine Progess Note    ASSESSMENT/PLAN   55 yo male found unresponsive in the field, received narcan, had CPR for 2 minutes, intubated, transferred to ICU for further management and care. Urine tox positive for tricyclics, cocaine, amphetamines; alcohol level greater than 100. Acute resp failure and acute encephalopathy from acute cocaine abuse  PULMONARY Acute respiratory failure Metabolic acidosis Drug overdose/polysubstance abuse P:  -Patients appears to be hypoventilating on weaning trial this morning and apneic at time. Will place back on vent and reduce respiratory rate from 18 to 10.  -Maintain O2 saturations greater than 88% -Minimize sedation. perform SBT/SAT daily   CARDIOVASCULAR -continue with hemodynamic monitoring  RENAL Metabolic Acidosis - cont with IVFs - ABG as needed.  - ICU electrolyte protocol  GASTROINTESTINAL SUP - PPI EtOH sue - monitor EtOH level - cont with IVF, thiamine, Folic Acid and MVI  HEMATOLOGIC -follow cbc  INFECTIOUS - currently on unasyn for possible aspiration  ENDOCRINE -ICU hypo/hyperglycemic protocol  NEUROLOGIC RASS goal: 1 - sedation/analgesia wean as tolerated.   ---------------------------------------   ----------------------------------------   Name: Duane Hayes MRN: [id number] DOB: 07/10/1961    ADMISSION DATE:  01/21/2016    CHIEF COMPLAINT: Dyspnea.    SUBJECTIVE:   Pt currently on the ventilator, can not provide history or review of systems.    VITAL SIGNS: Temp:  [98.6 F (37 C)-100.2 F (37.9 C)] 98.6 F (37 C) (02/01 1500) Pulse Rate:  [56-121] 57 (02/01 1500) Resp:  [7-19] 9 (02/01 1500) BP: (86-150)/(52-85) 127/71 mmHg (02/01 1500) SpO2:  [91 %-99 %] 99 % (02/01 1500) FiO2 (%):  [30 %] 30 % (02/01 0848) Weight:  [156 lb 4.9 oz (70.9 kg)] 156 lb 4.9 oz (70.9 kg) (02/01 0500) HEMODYNAMICS:   VENTILATOR SETTINGS: Vent Mode:  [-] PSV FiO2 (%):  [30  %] 30 % Set Rate:  [18 bmp] 18 bmp Vt Set:  [450 mL] 450 mL PEEP:  [5 cmH20] 5 cmH20 Pressure Support:  [5 cmH20] 5 cmH20 Plateau Pressure:  [15 cmH20] 15 cmH20 INTAKE / OUTPUT:  Intake/Output Summary (Last 24 hours) at 01/26/16 1627 Last data filed at 01/26/16 1500  Gross per 24 hour  Intake 2041.7 ml  Output    100 ml  Net 1941.7 ml    PHYSICAL EXAMINATION: Physical Examination:   VS: BP 127/71 mmHg  Pulse 57  Temp(Src) 98.6 F (37 C) (Core (Comment))  Resp 9  Ht  (1.727 m)  Wt 156 lb 4.9 oz (70.9 kg)  BMI 23.77 kg/m2  SpO2 99%  General Appearance: No distress  Neuro:without focal findings, mental status normal. HEENT: PERRLA, EOM intact. Pulmonary: normal breath sounds   CardiovascularNormal S1,S2.  No m/r/g.   Abdomen: Benign, Soft, non-tender. Renal:  No costovertebral tenderness  GU:  Not performed at this time. Endocrine: No evident thyromegaly. Skin:   warm, no rashes, no ecchymosis  Extremities: normal, no cyanosis, clubbing.   LABS:   LABORATORY PANEL:   CBC  Recent Labs Lab 01/25/16 0516  WBC 5.7  HGB 10.1*  HCT 30.6*  PLT 190    Chemistries   Recent Labs Lab 01/22/16 0451  01/24/16 0630 01/25/16 0516 01/26/16 0428  NA 146*  < > 142 136 142  K 3.5  < > 3.6 3.9 3.6  CL 112*  < > 110 104 105  CO2 31  < > GLUCOSE 98  < > 125* 102* 119*  BUN 16  < >  CREATININE 0.97  < > 0.92 0.71 0.81  CALCIUM 8.0*  < > 8.0* 7.8* 8.3*  MG 1.8  --  1.8 2.0  --   PHOS 2.7  --  3.6  --   --   AST 24  --   --   --   --   ALT 26  --   --   --   --   ALKPHOS 78  --   --   --   --   BILITOT 0.8  --   --   --   --   < > = values in this interval not displayed.   Recent Labs Lab 01/25/16 1557 01/25/16 1933 01/25/16 2355 01/26/16 0350 01/26/16 0745 01/26/16 1133  GLUCAP 77 149* 149* 161* 176* 132*    Recent Labs Lab 01/21/16 0355 01/21/16 0930  PHART 7.10* 7.37  PCO2ART 48 47  PO2ART 78* 108    Recent Labs Lab  01/21/16 0257 01/21/16 1348 01/22/16 0451  AST 38 36 24  ALT 37 32 26  ALKPHOS 113 84 78  BILITOT 0.8 1.0 0.8  ALBUMIN 4.1 3.2* 2.8*    Cardiac Enzymes  Recent Labs Lab 01/21/16 0257  TROPONINI <0.03    RADIOLOGY:  Dg Chest Port 1 View  01/25/2016  CLINICAL DATA:  PICC line placement.  Intubation. EXAM: PORTABLE CHEST 1 VIEW COMPARISON:  01/21/2016. FINDINGS: Left PICC line noted with tip at cavoatrial junction. Endotracheal tube, NG tube in stable position. Cardiomegaly with bilateral pulmonary interstitial prominence and small pleural effusions consistent with congestive heart failure. Findings have progressed from prior exam. No pneumothorax. IMPRESSION: 1. Interim placement of left PICC line, its tip is at the cavoatrial junction. Endotracheal tube and NG tube in stable position. 2. Congestive heart failure with progressive pulmonary edema and bilateral pleural effusions. Electronically Signed   By: Maisie Fus  Register   On: 01/25/2016 15:57       --Wells Guiles, MD.  [facility]  Santiago Glad, M.D.  Stephanie Acre, M.D.  Billy Fischer, M.D

## 2016-01-26 NOTE — Care Management (Addendum)
Patient currently extubated (second attempt) and at present holding 02 sats on nasal cannula.  Will order psych consult as soon as it is thought that this extubation will be successful.  Will make attempt to speak with patient's son/sister this day.

## 2016-01-26 NOTE — Progress Notes (Signed)
[facility] - [place] at [facility]   PATIENT NAME: Duane Hayes    MR#:  [id number]  DATE OF BIRTH:  10/06/1961  Admitted for altered mental status, respiratory failure, possible drug overdose.  Not Tolerating the weaning trials yet . has low-grade temperature 100F today.   CHIEF COMPLAINT:   Chief Complaint  Patient presents with  . Drug Overdose  on vent and sedation. Eyes opened. On precedex.  REVIEW OF SYSTEMS:   Review of Systems  Unable to perform ROS: intubated     DRUG ALLERGIES:  No Known Allergies  VITALS:  Blood pressure 101/62, pulse 61, temperature 99.9 F (37.7 C), temperature source Core (Comment), resp. rate 16, height  (1.727 m), weight 70.9 kg (156 lb 4.9 oz), SpO2 95 %.  PHYSICAL EXAMINATION:  GENERAL:  55 y.o.-year-old patient lying in the bed ,critically ill, on vent.  EYES: Pupils equal, round, reactive to light , No scleral icterus. Extraocular muscles intact.  HEENT: Head atraumatic, normocephalic. Orally intubated. NECK:  Supple, no jugular venous distention. No thyroid enlargement, no tenderness.  LUNGS: Normal breath sounds bilaterally, no wheezing, rales,rhonchi or crepitation. No use of accessory muscles of respiration.  CARDIOVASCULAR: S1, S2 normal. No murmurs, rubs, or gallops.  ABDOMEN: Soft, nontender, nondistended. Bowel sounds present. No organomegaly or mass.  EXTREMITIES: No pedal edema, cyanosis, or clubbing.  NEUROLOGIC: unable to do full neuro exam due to intubation and sedation. PSYCHIATRIC: intubated/sedated. Opened eyes on stimuli. SKIN: No obvious rash, lesion, or ulcer.    LABORATORY PANEL:   CBC  Recent Labs Lab 01/25/16 0516  WBC 5.7  HGB 10.1*  HCT 30.6*  PLT 190   ------------------------------------------------------------------------------------------------------------------  Chemistries   Recent Labs Lab 01/22/16 0451  01/25/16 0516 01/26/16 0428  NA 146*  < > 136 142   K 3.5  < > 3.9 3.6  CL 112*  < > 104 105  CO2 31  < > 29 30  GLUCOSE 98  < > 102* 119*  BUN 16  < > 12 17  CREATININE 0.97  < > 0.71 0.81  CALCIUM 8.0*  < > 7.8* 8.3*  MG 1.8  < > 2.0  --   AST 24  --   --   --   ALT 26  --   --   --   ALKPHOS 78  --   --   --   BILITOT 0.8  --   --   --   < > = values in this interval not displayed. ------------------------------------------------------------------------------------------------------------------  Cardiac Enzymes  Recent Labs Lab 01/21/16 0257  TROPONINI <0.03   ------------------------------------------------------------------------------------------------------------------  RADIOLOGY:  Dg Chest Port 1 View  01/25/2016  CLINICAL DATA:  PICC line placement.  Intubation. EXAM: PORTABLE CHEST 1 VIEW COMPARISON:  01/21/2016. FINDINGS: Left PICC line noted with tip at cavoatrial junction. Endotracheal tube, NG tube in stable position. Cardiomegaly with bilateral pulmonary interstitial prominence and small pleural effusions consistent with congestive heart failure. Findings have progressed from prior exam. No pneumothorax. IMPRESSION: 1. Interim placement of left PICC line, its tip is at the cavoatrial junction. Endotracheal tube and NG tube in stable position. 2. Congestive heart failure with progressive pulmonary edema and bilateral pleural effusions. Electronically Signed   By: Maisie Fus  Register   On: 01/25/2016 15:57    EKG:   Orders placed or performed during the hospital encounter of 01/21/16  . ED EKG  . ED EKG  . EKG 12-Lead  . EKG 12-Lead  ASSESSMENT AND PLAN:   1.acute respiratory failure ;due to Drug Overdose and alcohol intoxication;  Try to wean off sedation and vent support, weaning trials as per pulmonary and critical care.   Possible aspiration PNA,  follow blood cultures, so far negative.  Continue Unasyn IV.  2.cardiac arrest with hypothermia/very brief CPR; hypothermia and bradycardia are  resolved.  3.Metabolic acidosis ;improved with  Bicarb given in Er and IV  Fluids.  4.possible suicidal attempt;according to sister ,pt is talking about suicidal iseation recently;needs psych consult after extubation and sitter.  5.hypotension;likley due to sedation;continue iv fluid.    6.h/o ETOH abuse;continue Thiamine and folic acid, Watch for withdrawal.  7 nutrition: continue ube feedings.  I discussed with Dr. Nicholos Johns. Condition  critical high risk for cardiac arrest. All the records are reviewed and case discussed with Care Management/Social Workerr. Management plans discussed with the patient, sisters and they are in agreement.  CODE STATUS: full  TOTAL CRITICAL TIME TAKING CARE OF THIS PATIENT: 38  minutes. CCT  POSSIBLE D/C IN  >3DAYS, DEPENDING ON CLINICAL CONDITION.   Shaune Pollack M.D on 01/26/2016 at 8:28 AM  Between 7am to 6pm - Pager - [phone]  After 6pm go to [email or web address] - password [email or web address] [facility]  [facility]  Office  [phone]  CC: Primary care physician; No PCP Per Patient   Note: This dictation was prepared with Dragon dictation along with smaller phrase technology. Any transcriptional errors that result from this process are unintentional.

## 2016-01-26 NOTE — Progress Notes (Signed)
RT to room to collect ABG after placing in SBT at 0740.  Patient very combative at this time, Dr. Ardyth Man in to see patient.  Dr. Ardyth Man gave verbal order to extubate patient.  Patient suctioned times 3 and extubated to 2LPM [employer] without complication.  Will continue to monitor.

## 2016-01-26 NOTE — Progress Notes (Signed)
Nutrition Follow-up   INTERVENTION:   Coordination of Care: await diet progression as medically able Medical Food Supplement Therapy: will recommend once diet order able to be advanced   NUTRITION DIAGNOSIS:   Inadequate oral intake related to acute illness as evidenced by NPO status  GOAL:   Provide needs based on ASPEN/SCCM guidelines  MONITOR:    (Energy Intake, Anthropometrics, Electrolyte/Renal Profile, Digestive System, Pulmonary)  REASON FOR ASSESSMENT:   Consult Enteral/tube feeding initiation and management  ASSESSMENT:    Pt extubated this am.  Diet Order:   NPO   Current Nutrition: Pt remains NPO, pt tolerating Vital 1.5 yesterday at goal rate of 67mL/hr   Gastrointestinal Profile: Last BM: no BM documented since admission   Scheduled Medications:  . ampicillin-sulbactam (UNASYN) IV  3 g Intravenous Q6H  . antiseptic oral rinse  7 mL Mouth Rinse QID  . chlorhexidine gluconate  15 mL Mouth Rinse BID  . diazepam  5 mg Intravenous Q6H  . folic acid  1 mg Oral Daily  . heparin  5,000 Units Subcutaneous 3 times per day  . insulin aspart  2-6 Units Subcutaneous 6 times per day  . pantoprazole sodium  40 mg Per Tube Q24H  . polyethylene glycol  17 g Oral BID  . senna-docusate  2 tablet Oral BID  . sodium chloride flush  3 mL Intravenous Q12H  . thiamine  100 mg Oral Daily    Continuous Medications:  . dexmedetomidine 1.2 mcg/kg/hr (01/26/16 1259)     Electrolyte/Renal Profile and Glucose Profile:   Recent Labs Lab 01/22/16 0451  01/24/16 0630 01/25/16 0516 01/26/16 0428  NA 146*  < > 142 136 142  K 3.5  < > 3.6 3.9 3.6  CL 112*  < > 110 104 105  CO2 31  < > BUN 16  < > CREATININE 0.97  < > 0.92 0.71 0.81  CALCIUM 8.0*  < > 8.0* 7.8* 8.3*  MG 1.8  --  1.8 2.0  --   PHOS 2.7  --  3.6  --   --   GLUCOSE 98  < > 125* 102* 119*  < > = values in this interval not displayed. Protein Profile:  Recent Labs Lab  01/21/16 0257 01/21/16 1348 01/22/16 0451  ALBUMIN 4.1 3.2* 2.8*    Weight Trend since Admission: Filed Weights   01/24/16 0400 01/25/16 0500 01/26/16 0500  Weight: 156 lb 8.4 oz (71 kg) 156 lb 8.4 oz (71 kg) 156 lb 4.9 oz (70.9 kg)     BMI:  Body mass index is 23.77 kg/(m^2).  Estimated Nutritional Needs:   Kcal:  1895 kcals (Ve: 8, Tmax: 38.4) using wt of 69.7 kg  Protein:  83-138 g (1.2-2.0 g/kg)   Fluid:  1725-2070 ml (25-30 ml/kg)    HIGH Care Level  Leda Quail, RD, LDN Pager [phone] Weekend/On-Call Pager [phone]

## 2016-01-26 NOTE — Progress Notes (Signed)
PHARMACY - CRITICAL CARE PROGRESS NOTE  Pharmacy Consult for Unasyn (6/7), Constipation Prevention, Electrolyte Replacement and Glucose Management    No Known Allergies  Patient Measurements: Height:  (172.7 cm) Weight: 156 lb 4.9 oz (70.9 kg) IBW/kg (Calculated) : 68.4   Vital Signs: Temp: 99.3 F (37.4 C) (02/01 0800) Temp Source: Core (Comment) (02/01 0400) BP: 145/77 mmHg (02/01 0900) Pulse Rate: 101 (02/01 0900) Intake/Output from previous day: 01/31 0701 - 02/01 0700 In: 2827.1 [I.V.:702.4; NG/GT:1824.7; IV Piggyback:300] Out: 555 [Urine:555] Intake/Output from this shift: Total I/O In: 161.4 [I.V.:61.4; IV Piggyback:100] Out: -  Vent settings for last 24 hours: Vent Mode:  [-] PSV FiO2 (%):  [30 %] 30 % Set Rate:  [18 bmp] 18 bmp Vt Set:  [450 mL] 450 mL PEEP:  [5 cmH20] 5 cmH20 Pressure Support:  [5 cmH20] 5 cmH20 Plateau Pressure:  [15 cmH20] 15 cmH20  Labs:  Recent Labs  01/24/16 0630 01/25/16 0516 01/26/16 0428  WBC 5.9 5.7  --   HGB 9.6* 10.1*  --   HCT 29.5* 30.6*  --   PLT 134* 190  --   CREATININE 0.92 0.71 0.81  MG 1.8 2.0  --   PHOS 3.6  --   --    Estimated Creatinine Clearance: 100.9 mL/min (by C-G formula based on Cr of 0.81).   Recent Labs  01/26/16 0350 01/26/16 0745 01/26/16 1133  GLUCAP 161* 176* 132*    Microbiology: Recent Results (from the past 720 hour(s))  Blood culture (routine x 2)     Status: None (Preliminary result)   Collection Time: 01/21/16  4:20 AM  Result Value Ref Range Status   Specimen Description BLOOD RIGHT ANTECUBITAL  Final   Special Requests BOTTLES DRAWN AEROBIC AND ANAEROBIC  Final   Culture NO GROWTH 4 DAYS  Final   Report Status PENDING  Incomplete  Blood culture (routine x 2)     Status: None (Preliminary result)   Collection Time: 01/21/16  4:20 AM  Result Value Ref Range Status   Specimen Description BLOOD LEFT WRIST  Final   Special Requests BOTTLES DRAWN AEROBIC AND ANAEROBIC   Final   Culture NO GROWTH 4 DAYS  Final   Report Status PENDING  Incomplete  MRSA PCR Screening     Status: None   Collection Time: 01/21/16  8:51 AM  Result Value Ref Range Status   MRSA by PCR NEGATIVE NEGATIVE Final    Comment:        The GeneXpert MRSA Assay (FDA approved for NASAL specimens only), is one component of a comprehensive MRSA colonization surveillance program. It is not intended to diagnose MRSA infection nor to guide or monitor treatment for MRSA infections.   Urine culture     Status: None   Collection Time: 01/23/16  6:09 PM  Result Value Ref Range Status   Specimen Description URINE, CATHETERIZED  Final   Special Requests Normal  Final   Culture NO GROWTH 1 DAY  Final   Report Status 01/25/2016 FINAL  Final  Culture, expectorated sputum-assessment     Status: None   Collection Time: 01/25/16  2:57 PM  Result Value Ref Range Status   Specimen Description SPUTUM  Final   Special Requests NONE  Final   Sputum evaluation THIS SPECIMEN IS ACCEPTABLE FOR SPUTUM CULTURE  Final   Report Status 01/25/2016 FINAL  Final  Culture, respiratory (NON-Expectorated)     Status: None (Preliminary result)   Collection Time:  01/25/16  2:57 PM  Result Value Ref Range Status   Specimen Description SPUTUM  Final   Special Requests NONE Reflexed from [id number]  Final   Gram Stain PENDING  Incomplete   Culture   Final    MODERATE GROWTH GRAM NEGATIVE RODS IDENTIFICATION AND SUSCEPTIBILITIES TO FOLLOW    Report Status PENDING  Incomplete    Medications:  Scheduled:  . ampicillin-sulbactam (UNASYN) IV  3 g Intravenous Q6H  . antiseptic oral rinse  7 mL Mouth Rinse QID  . chlorhexidine gluconate  15 mL Mouth Rinse BID  . diazepam  5 mg Intravenous Q6H  . folic acid  1 mg Oral Daily  . heparin  5,000 Units Subcutaneous 3 times per day  . insulin aspart  2-6 Units Subcutaneous 6 times per day  . pantoprazole sodium  40 mg Per Tube Q24H  . polyethylene glycol  17 g Oral  BID  . senna-docusate  2 tablet Oral BID  . sodium chloride flush  3 mL Intravenous Q12H  . thiamine  100 mg Oral Daily   Infusions:  . dexmedetomidine 1.2 mcg/kg/hr (01/26/16 1259)    Assessment: Pharmacy consulted for medication management for 55 yo male ICU patient requiring mechanical ventilation.    Plan:  1. Unasyn (6/7): Will continue patient on Unasyn 3g IV Q6hr to cover possible aspiration PNA.     2. Constipation: Patient intubated and sedated with no BM to date. Will continue senna-docusate 2 tab VT bid and add increase Miralax to bid.   3. Electrolytes: electrolytes are within normal limits. Will obtain follow-up electrolytes with am labs.    4. Glucose Management: Continue SSI coverage.    Pharmacy will continue to monitor and adjust per consult.    Luisa Hart D 01/26/2016,3:25 PM

## 2016-01-27 ENCOUNTER — Inpatient Hospital Stay: Payer: Self-pay

## 2016-01-27 LAB — BLOOD GAS, ARTERIAL
ACID-BASE EXCESS: 7.7 mmol/L — AB (ref 0.0–3.0)
Bicarbonate: 31 mEq/L — ABNORMAL HIGH (ref 21.0–28.0)
FIO2: 0.28
O2 SAT: 89.1 %
PATIENT TEMPERATURE: 37
PCO2 ART: 38 mmHg (ref 32.0–48.0)
pH, Arterial: 7.52 — ABNORMAL HIGH (ref 7.350–7.450)
pO2, Arterial: 50 mmHg — ABNORMAL LOW (ref 83.0–108.0)

## 2016-01-27 LAB — BASIC METABOLIC PANEL
ANION GAP: 8 (ref 5–15)
BUN: 13 mg/dL (ref 6–20)
CALCIUM: 8.5 mg/dL — AB (ref 8.9–10.3)
CO2: 29 mmol/L (ref 22–32)
Chloride: 105 mmol/L (ref 101–111)
Creatinine, Ser: 0.57 mg/dL — ABNORMAL LOW (ref 0.61–1.24)
Glucose, Bld: 109 mg/dL — ABNORMAL HIGH (ref 65–99)
Potassium: 3.5 mmol/L (ref 3.5–5.1)
Sodium: 142 mmol/L (ref 135–145)

## 2016-01-27 LAB — GLUCOSE, CAPILLARY
Glucose-Capillary: 100 mg/dL — ABNORMAL HIGH (ref 65–99)
Glucose-Capillary: 108 mg/dL — ABNORMAL HIGH (ref 65–99)
Glucose-Capillary: 109 mg/dL — ABNORMAL HIGH (ref 65–99)
Glucose-Capillary: 117 mg/dL — ABNORMAL HIGH (ref 65–99)
Glucose-Capillary: 118 mg/dL — ABNORMAL HIGH (ref 65–99)
Glucose-Capillary: 127 mg/dL — ABNORMAL HIGH (ref 65–99)

## 2016-01-27 LAB — CULTURE, BLOOD (ROUTINE X 2)
CULTURE: NO GROWTH
Culture: NO GROWTH

## 2016-01-27 LAB — CBC
HCT: 29.2 % — ABNORMAL LOW (ref 40.0–52.0)
Hemoglobin: 9.7 g/dL — ABNORMAL LOW (ref 13.0–18.0)
MCH: 31.2 pg (ref 26.0–34.0)
MCHC: 33.2 g/dL (ref 32.0–36.0)
MCV: 94.1 fL (ref 80.0–100.0)
PLATELETS: 207 10*3/uL (ref 150–440)
RBC: 3.1 MIL/uL — ABNORMAL LOW (ref 4.40–5.90)
RDW: 14.2 % (ref 11.5–14.5)
WBC: 8.5 10*3/uL (ref 3.8–10.6)

## 2016-01-27 LAB — CULTURE, RESPIRATORY W GRAM STAIN

## 2016-01-27 LAB — CULTURE, RESPIRATORY

## 2016-01-27 MED ORDER — LORAZEPAM 2 MG/ML IJ SOLN
2.0000 mg | INTRAMUSCULAR | Status: DC | PRN
  Administered 2016-01-27: 2 mg via INTRAVENOUS
  Administered 2016-01-27: 4 mg via INTRAVENOUS
  Administered 2016-01-27 (×4): 2 mg via INTRAVENOUS
  Administered 2016-01-27 – 2016-01-28 (×8): 4 mg via INTRAVENOUS
  Administered 2016-01-28: 2 mg via INTRAVENOUS
  Administered 2016-01-28: 4 mg via INTRAVENOUS
  Administered 2016-01-28: 2 mg via INTRAVENOUS
  Administered 2016-01-29: 4 mg via INTRAVENOUS
  Filled 2016-01-27 (×5): qty 2
  Filled 2016-01-27: qty 1
  Filled 2016-01-27 (×4): qty 2
  Filled 2016-01-27: qty 1
  Filled 2016-01-27: qty 2
  Filled 2016-01-27: qty 1
  Filled 2016-01-27 (×2): qty 2
  Filled 2016-01-27: qty 1

## 2016-01-27 MED ORDER — DIAZEPAM 5 MG/ML IJ SOLN
5.0000 mg | INTRAMUSCULAR | Status: DC
  Administered 2016-01-27 – 2016-01-31 (×25): 5 mg via INTRAVENOUS
  Filled 2016-01-27 (×24): qty 2

## 2016-01-27 MED ORDER — LORAZEPAM 2 MG/ML IJ SOLN
2.0000 mg | Freq: Once | INTRAMUSCULAR | Status: AC
  Administered 2016-01-27: 2 mg via INTRAVENOUS

## 2016-01-27 MED ORDER — HALOPERIDOL LACTATE 5 MG/ML IJ SOLN
2.0000 mg | Freq: Four times a day (QID) | INTRAMUSCULAR | Status: DC | PRN
  Administered 2016-01-27 – 2016-01-28 (×4): 2 mg via INTRAVENOUS
  Filled 2016-01-27 (×5): qty 1

## 2016-01-27 NOTE — Progress Notes (Signed)
[facility] - [place] at [facility]   PATIENT NAME: Duane Hayes    MR#:  [id number]  DATE OF BIRTH:  01-27-1961  SUBJECTIVE:  CHIEF COMPLAINT:   Chief Complaint  Patient presents with  . Drug Overdose   - Patient is extubated yesterday. Admitted for possible drug overdose with cocaine and also alcohol intoxication. -Very agitated and combative. Spitting on people. Has a mouth mask in place. -Continue Precedex drip. Also requiring Ativan and Haldol when necessary  REVIEW OF SYSTEMS:  Review of Systems  Unable to perform ROS: critical illness    DRUG ALLERGIES:  No Known Allergies  VITALS:  Blood pressure 174/100, pulse 101, temperature 99 F (37.2 C), temperature source Core (Comment), resp. rate 22, height  (1.727 m), weight 71.6 kg (157 lb 13.6 oz), SpO2 98 %.  PHYSICAL EXAMINATION:  Physical Exam  GENERAL:  55 y.o.-year-old patient lying in the bed, very agitated and restless. Sitter at bedside.  EYES: Pupils equal, round, reactive to light and accommodation. No scleral icterus. Extraocular muscles intact.  HEENT: Head atraumatic, normocephalic. Oropharynx and nasopharynx clear.  NECK:  Supple, no jugular venous distention. No thyroid enlargement, no tenderness.  LUNGS: Normal breath sounds bilaterally, no wheezing, rales,rhonchi or crepitation. No use of accessory muscles of respiration. Decreased bibasilar breath sounds. CARDIOVASCULAR: S1, S2 normal. No murmurs, rubs, or gallops.  ABDOMEN: Soft, nontender, nondistended. Bowel sounds present. No organomegaly or mass.  EXTREMITIES: No pedal edema, cyanosis, or clubbing.  NEUROLOGIC: Not cooperative for neuro exam. But able to move all 4 extremities in bed. No focal neuro deficits noted so far. PSYCHIATRIC: The patient is very confused and not oriented at all.  SKIN: No obvious rash, lesion, or ulcer.    LABORATORY PANEL:   CBC  Recent Labs Lab 01/27/16 0127  WBC 8.5  HGB 9.7*   HCT 29.2*  PLT 207   ------------------------------------------------------------------------------------------------------------------  Chemistries   Recent Labs Lab 01/22/16 0451  01/25/16 0516  01/27/16 0124  NA 146*  < > 136  < > 142  K 3.5  < > 3.9  < > 3.5  CL 112*  < > 104  < > 105  CO2 31  < > 29  < > 29  GLUCOSE 98  < > 102*  < > 109*  BUN 16  < > 12  < > 13  CREATININE 0.97  < > 0.71  < > 0.57*  CALCIUM 8.0*  < > 7.8*  < > 8.5*  MG 1.8  < > 2.0  --   --   AST 24  --   --   --   --   ALT 26  --   --   --   --   ALKPHOS 78  --   --   --   --   BILITOT 0.8  --   --   --   --   < > = values in this interval not displayed. ------------------------------------------------------------------------------------------------------------------  Cardiac Enzymes  Recent Labs Lab 01/21/16 0257  TROPONINI <0.03   ------------------------------------------------------------------------------------------------------------------  RADIOLOGY:  Dg Chest 1 View  01/27/2016  CLINICAL DATA:  Shortness of breath. EXAM: CHEST 1 VIEW COMPARISON:  01/25/2016. FINDINGS: Interim extubation removal of NG tube. Left PICC line in stable position. Heart size stable. Low lung volumes with mild bibasilar atelectasis and/or infiltrates, slight interim clearing. No pleural effusion or pneumothorax. IMPRESSION: 1. Interim extubation removal of NG tube. Left PICC line in stable position.  2. Low lung volumes with mild bibasilar atelectasis and/or infiltrates, slight interim clearing. Electronically Signed   By: Maisie Fus  Register   On: 01/27/2016 07:31    EKG:   Orders placed or performed during the hospital encounter of 01/21/16  . ED EKG  . ED EKG  . EKG 12-Lead  . EKG 12-Lead    ASSESSMENT AND PLAN:   55 year old male was noted to be unconscious in a motel room. He was intubated for airway protection. Possible overdose suspected  1.acute respiratory failure : due to Drug Overdose and alcohol  intoxication;  - Appreciate pulmonary critical care consultation. -Patient is extubated yesterday and currently remains on nasal cannula. Possible aspiration PNA,-negative blood cultures so far. -Chest x-ray from today showing only low lung volumes. Infiltrate has been cleared. -On IV Unasyn. Will finish 7 days total. Discontinue tomorrow.  2.cardiac arrest with hypothermia/very brief CPR; hypothermia and bradycardia are resolved.  3.Metabolic acidosis : improved with Bicarb  4.drug overdose-possible suicidal attempt;according to sister. continue sitter. Psych consult. - Urine tox when admitted was positive for amphetamines, cocaine, marijuana and tricyclic antidepressants.  5.delirium tremens-significantly delirious, intoxicated with alcohol and presented. Significant use of alcohol use and also drug abuse. -Continue Precedex drip for now. Also Ativan and Haldol when necessary -If cannot be controlled, might have to be reintubated for sedation.   6.h/o ETOH abuse;continue Thiamine and folic acid,  -On Precedex and Ativan for withdrawals    All the records are reviewed and case discussed with Care Management/Social Workerr. Management plans discussed with the patient, family and they are in agreement.  CODE STATUS: Full Code  TOTAL CRITICAL CARE TIME SPENT IN TAKING CARE OF THIS PATIENT: .   POSSIBLE D/C IN several days, DEPENDING ON CLINICAL CONDITION.   Enid Baas M.D on 01/27/2016 at 5:23 PM  Between 7am to 6pm - Pager - [phone]  After 6pm go to [email or web address] - password [email or web address] [facility]  [facility]  Office  [phone]  CC: Primary care physician; No PCP Per Patient

## 2016-01-27 NOTE — Progress Notes (Signed)
Patient failed nursing swallow screening.

## 2016-01-27 NOTE — Progress Notes (Signed)
Pt had a couple of rare 1.4 sec pauses. Rhythm back to sinus at 60 bpm. Pt was asymptomatic. Precidex decreased to 0.8.

## 2016-01-27 NOTE — Progress Notes (Signed)
Precidex was decreased to 0.7. No more pauses; however, pts HR has dropped to the 30s twice. Pt remains agitated. Ativan and Valium have been given. Dr. Dema Severin notified, no new orders given. Will give ordered Fentanyl bolus.

## 2016-01-27 NOTE — Progress Notes (Signed)
Notified Dr. Ardyth Man that patient had 2 short runs of vtach per telemetry- 9 beat then 5 beat. No new orders.

## 2016-01-27 NOTE — Progress Notes (Signed)
Spoke with Dr. Dema Severin, pt having pauses more frequently and dropping HR to 30s. Pt remains asymptomatic. Precidex currently at 0.5, will continue to tapper off per Dr. Dema Severin.

## 2016-01-27 NOTE — Progress Notes (Signed)
PHARMACY - CRITICAL CARE PROGRESS NOTE  Pharmacy Consult for Unasyn (7/14), Constipation Prevention, Electrolyte Replacement and Glucose Management    No Known Allergies  Patient Measurements: Height:  (172.7 cm) Weight: 157 lb 13.6 oz (71.6 kg) IBW/kg (Calculated) : 68.4   Vital Signs: Temp: 98.6 F (37 C) (02/02 1000) Temp Source: Core (Comment) (02/02 0400) BP: 121/68 mmHg (02/02 1000) Pulse Rate: 56 (02/02 1000) Intake/Output from previous day: 02/01 0701 - 02/02 0700 In: 806.9 [I.V.:406.9; IV Piggyback:400] Out: 1475 [Urine:1475] Intake/Output from this shift: Total I/O In: 140.9 [I.V.:40.9; IV Piggyback:100] Out: -  Vent settings for last 24 hours:    Labs:  Recent Labs  01/25/16 0516 01/26/16 0428 01/27/16 0124 01/27/16 0127  WBC 5.7  --   --  8.5  HGB 10.1*  --   --  9.7*  HCT 30.6*  --   --  29.2*  PLT 190  --   --  207  CREATININE 0.71 0.81 0.57*  --   MG 2.0  --   --   --    Estimated Creatinine Clearance: 102.1 mL/min (by C-G formula based on Cr of 0.57).   Recent Labs  01/27/16 0410 01/27/16 0814 01/27/16 1151  GLUCAP 100* 109* 118*    Microbiology: Recent Results (from the past 720 hour(s))  Blood culture (routine x 2)     Status: None   Collection Time: 01/21/16  4:20 AM  Result Value Ref Range Status   Specimen Description BLOOD RIGHT ANTECUBITAL  Final   Special Requests BOTTLES DRAWN AEROBIC AND ANAEROBIC  Final   Culture NO GROWTH 6 DAYS  Final   Report Status 01/27/2016 FINAL  Final  Blood culture (routine x 2)     Status: None   Collection Time: 01/21/16  4:20 AM  Result Value Ref Range Status   Specimen Description BLOOD LEFT WRIST  Final   Special Requests BOTTLES DRAWN AEROBIC AND ANAEROBIC  Final   Culture NO GROWTH 6 DAYS  Final   Report Status 01/27/2016 FINAL  Final  MRSA PCR Screening     Status: None   Collection Time: 01/21/16  8:51 AM  Result Value Ref Range Status   MRSA by PCR NEGATIVE NEGATIVE  Final    Comment:        The GeneXpert MRSA Assay (FDA approved for NASAL specimens only), is one component of a comprehensive MRSA colonization surveillance program. It is not intended to diagnose MRSA infection nor to guide or monitor treatment for MRSA infections.   Urine culture     Status: None   Collection Time: 01/23/16  6:09 PM  Result Value Ref Range Status   Specimen Description URINE, CATHETERIZED  Final   Special Requests Normal  Final   Culture NO GROWTH 1 DAY  Final   Report Status 01/25/2016 FINAL  Final  Culture, expectorated sputum-assessment     Status: None   Collection Time: 01/25/16  2:57 PM  Result Value Ref Range Status   Specimen Description SPUTUM  Final   Special Requests NONE  Final   Sputum evaluation THIS SPECIMEN IS ACCEPTABLE FOR SPUTUM CULTURE  Final   Report Status 01/25/2016 FINAL  Final  Culture, respiratory (NON-Expectorated)     Status: None   Collection Time: 01/25/16  2:57 PM  Result Value Ref Range Status   Specimen Description SPUTUM  Final   Special Requests NONE Reflexed from [id number]  Final   Gram Stain   Final  MODERATE WBC SEEN RARE SQUAMOUS EPITHELIAL CELLS PRESENT RARE GRAM POSITIVE COCCI FEW GRAM NEGATIVE RODS    Culture MODERATE GROWTH ESCHERICHIA COLI  Final   Report Status 01/27/2016 FINAL  Final   Organism ID, Bacteria ESCHERICHIA COLI  Final      Susceptibility   Escherichia coli - MIC*    AMPICILLIN Value in next row Resistant      RESISTANT>=32    CEFAZOLIN Value in next row Sensitive      SENSITIVE<=4    CEFEPIME Value in next row Sensitive      SENSITIVE<=1    CEFTAZIDIME Value in next row Sensitive      SENSITIVE<=1    CEFTRIAXONE Value in next row Sensitive      SENSITIVE<=1    CIPROFLOXACIN Value in next row Sensitive      SENSITIVE<=0.25    GENTAMICIN Value in next row Sensitive      SENSITIVE<=1    IMIPENEM Value in next row Sensitive      SENSITIVE<=0.25    TRIMETH/SULFA Value in next row  Resistant      RESISTANT>=320    AMPICILLIN/SULBACTAM Value in next row Sensitive      SENSITIVE4    PIP/TAZO Value in next row Sensitive      SENSITIVE<=4    Extended ESBL Value in next row Sensitive      SENSITIVE<=4    * MODERATE GROWTH ESCHERICHIA COLI    Medications:  Scheduled:  . ampicillin-sulbactam (UNASYN) IV  3 g Intravenous Q6H  . antiseptic oral rinse  7 mL Mouth Rinse BID  . diazepam  5 mg Intravenous 6 times per day  . folic acid  1 mg Oral Daily  . heparin  5,000 Units Subcutaneous 3 times per day  . insulin aspart  2-6 Units Subcutaneous 6 times per day  . pantoprazole sodium  40 mg Per Tube Q24H  . polyethylene glycol  17 g Oral BID  . senna-docusate  2 tablet Oral BID  . sodium chloride flush  3 mL Intravenous Q12H  . thiamine  100 mg Oral Daily   Infusions:  . dexmedetomidine 1.2 mcg/kg/hr (01/27/16 1225)    Assessment: Pharmacy consulted for medication management for 55 yo male ICU patient requiring mechanical ventilation.    Plan:  1. Unasyn (7/14): Sputum culture growing E coli. After discussion with Dr. Nicholos Johns, will continue Unasyn for 14 days total.  Will continue patient on Unasyn 3g IV Q6hr.     2. Constipation: Patient intubated and sedated with no BM to date. Will continue senna-docusate 2 tab VT bid and Miralax bid. Patient is currently unable to swallow. Will f/u AM.   3. Electrolytes: electrolytes are within normal limits. Will obtain follow-up electrolytes with am labs.    4. Glucose Management: Continue SSI coverage.    Pharmacy will continue to monitor and adjust per consult.    Luisa Hart D 01/27/2016,2:58 PM

## 2016-01-27 NOTE — Progress Notes (Signed)
eLink Physician-Brief Progress Note Patient Name: Duane Hayes DOB: [birth year] MRN: [id number]   Date of Service  01/27/2016  HPI/Events of Note  Patient on precedex 0.8, had a short run on bradycardia to the 30s, precedex reduced to 0.7, but now with increased agitation  eICU Interventions  Currently has fentanyl PRN, ativan PRN, and scheduled valium on his MAR, in addition to precedex Cont to monitor.      Intervention Category Major Interventions: Other: Minor Interventions: Agitation / anxiety - evaluation and management  Daun Rens 01/27/2016, 12:41 AM

## 2016-01-28 DIAGNOSIS — J690 Pneumonitis due to inhalation of food and vomit: Secondary | ICD-10-CM

## 2016-01-28 LAB — CBC
HCT: 33.2 % — ABNORMAL LOW (ref 40.0–52.0)
Hemoglobin: 11 g/dL — ABNORMAL LOW (ref 13.0–18.0)
MCH: 30.8 pg (ref 26.0–34.0)
MCHC: 33.3 g/dL (ref 32.0–36.0)
MCV: 92.6 fL (ref 80.0–100.0)
PLATELETS: 277 10*3/uL (ref 150–440)
RBC: 3.58 MIL/uL — ABNORMAL LOW (ref 4.40–5.90)
RDW: 14 % (ref 11.5–14.5)
WBC: 9.7 10*3/uL (ref 3.8–10.6)

## 2016-01-28 LAB — GLUCOSE, CAPILLARY
Glucose-Capillary: 106 mg/dL — ABNORMAL HIGH (ref 65–99)
Glucose-Capillary: 106 mg/dL — ABNORMAL HIGH (ref 65–99)
Glucose-Capillary: 114 mg/dL — ABNORMAL HIGH (ref 65–99)
Glucose-Capillary: 125 mg/dL — ABNORMAL HIGH (ref 65–99)
Glucose-Capillary: 85 mg/dL (ref 65–99)

## 2016-01-28 LAB — PHOSPHORUS: Phosphorus: 4 mg/dL (ref 2.5–4.6)

## 2016-01-28 LAB — BASIC METABOLIC PANEL
Anion gap: 8 (ref 5–15)
BUN: 9 mg/dL (ref 6–20)
CHLORIDE: 107 mmol/L (ref 101–111)
CO2: 26 mmol/L (ref 22–32)
Calcium: 8.5 mg/dL — ABNORMAL LOW (ref 8.9–10.3)
Creatinine, Ser: 0.7 mg/dL (ref 0.61–1.24)
GFR calc Af Amer: >60 mL/min (ref >60)
GFR calc non Af Amer: >60 mL/min (ref >60)
GLUCOSE: 118 mg/dL — AB (ref 65–99)
POTASSIUM: 3.3 mmol/L — AB (ref 3.5–5.1)
SODIUM: 141 mmol/L (ref 135–145)

## 2016-01-28 LAB — MAGNESIUM: Magnesium: 2.1 mg/dL (ref 1.7–2.4)

## 2016-01-28 MED ORDER — PHENOBARBITAL SODIUM 65 MG/ML IJ SOLN
65.0000 mg | Freq: Four times a day (QID) | INTRAMUSCULAR | Status: DC | PRN
  Administered 2016-01-28 – 2016-01-30 (×3): 65 mg via INTRAVENOUS
  Filled 2016-01-28 (×3): qty 1

## 2016-01-28 MED ORDER — PHENOBARBITAL SODIUM 130 MG/ML IJ SOLN
100.0000 mg | Freq: Once | INTRAMUSCULAR | Status: AC
  Administered 2016-01-28: 100 mg via INTRAVENOUS
  Filled 2016-01-28: qty 1

## 2016-01-28 MED ORDER — HALOPERIDOL LACTATE 5 MG/ML IJ SOLN
5.0000 mg | Freq: Once | INTRAMUSCULAR | Status: AC
  Administered 2016-01-28: 5 mg via INTRAVENOUS

## 2016-01-28 MED ORDER — POTASSIUM CHLORIDE 10 MEQ/100ML IV SOLN
10.0000 meq | INTRAVENOUS | Status: AC
  Administered 2016-01-28 (×3): 10 meq via INTRAVENOUS
  Filled 2016-01-28 (×3): qty 100

## 2016-01-28 MED ORDER — HALOPERIDOL LACTATE 5 MG/ML IJ SOLN
INTRAMUSCULAR | Status: AC
  Administered 2016-01-28: 5 mg via INTRAVENOUS
  Filled 2016-01-28: qty 1

## 2016-01-28 MED ORDER — PANTOPRAZOLE SODIUM 40 MG IV SOLR
40.0000 mg | INTRAVENOUS | Status: DC
  Administered 2016-01-28 – 2016-01-30 (×3): 40 mg via INTRAVENOUS
  Filled 2016-01-28 (×3): qty 40

## 2016-01-28 MED ORDER — BISACODYL 10 MG RE SUPP
10.0000 mg | Freq: Once | RECTAL | Status: AC
  Administered 2016-01-28: 10 mg via RECTAL
  Filled 2016-01-28: qty 1

## 2016-01-28 MED ORDER — HALOPERIDOL LACTATE 5 MG/ML IJ SOLN
3.0000 mg | INTRAMUSCULAR | Status: DC | PRN
  Administered 2016-01-28 – 2016-01-30 (×5): 3 mg via INTRAVENOUS
  Filled 2016-01-28 (×5): qty 1

## 2016-01-28 NOTE — Progress Notes (Signed)
Pt now resting quietly after increasing Precedex drip to 1.63mcg/kg/hr and a one time dose of Haldol  per eLink MD order. Vitals stable. Family visiting with Pt and updated. Will continue to monitor.

## 2016-01-28 NOTE — Progress Notes (Signed)
PHARMACY - CRITICAL CARE PROGRESS NOTE  Pharmacy Consult for Unasyn (8/14), Constipation Prevention, Electrolyte Replacement and Glucose Management    No Known Allergies  Patient Measurements: Height:  (172.7 cm) Weight: 156 lb 4.9 oz (70.9 kg) IBW/kg (Calculated) : 68.4   Vital Signs: Temp: 99 F (37.2 C) (02/03 0900) Temp Source: Other (Comment) (02/03 0400) BP: 130/86 mmHg (02/03 0900) Pulse Rate: 71 (02/03 0900) Intake/Output from previous day: 02/02 0701 - 02/03 0700 In: 922.6 [I.V.:522.6; IV Piggyback:400] Out: 5900 [Urine:5900] Intake/Output from this shift: Total I/O In: 10 [I.V.:10] Out: 1550 [Urine:1550] Vent settings for last 24 hours:    Labs:  Recent Labs  01/26/16 0428 01/27/16 0124 01/27/16 0127 01/28/16 0453  WBC  --   --  8.5 9.7  HGB  --   --  9.7* 11.0*  HCT  --   --  29.2* 33.2*  PLT  --   --  207 277  CREATININE 0.81 0.57*  --  0.70  MG  --   --   --  2.1  PHOS  --   --   --  4.0   Estimated Creatinine Clearance: 102.1 mL/min (by C-G formula based on Cr of 0.7).   Recent Labs  01/28/16 0005 01/28/16 0347 01/28/16 0809  GLUCAP 85 114* 106*    Microbiology: Recent Results (from the past 720 hour(s))  Blood culture (routine x 2)     Status: None   Collection Time: 01/21/16  4:20 AM  Result Value Ref Range Status   Specimen Description BLOOD RIGHT ANTECUBITAL  Final   Special Requests BOTTLES DRAWN AEROBIC AND ANAEROBIC  Final   Culture NO GROWTH 6 DAYS  Final   Report Status 01/27/2016 FINAL  Final  Blood culture (routine x 2)     Status: None   Collection Time: 01/21/16  4:20 AM  Result Value Ref Range Status   Specimen Description BLOOD LEFT WRIST  Final   Special Requests BOTTLES DRAWN AEROBIC AND ANAEROBIC  Final   Culture NO GROWTH 6 DAYS  Final   Report Status 01/27/2016 FINAL  Final  MRSA PCR Screening     Status: None   Collection Time: 01/21/16  8:51 AM  Result Value Ref Range Status   MRSA by PCR  NEGATIVE NEGATIVE Final    Comment:        The GeneXpert MRSA Assay (FDA approved for NASAL specimens only), is one component of a comprehensive MRSA colonization surveillance program. It is not intended to diagnose MRSA infection nor to guide or monitor treatment for MRSA infections.   Urine culture     Status: None   Collection Time: 01/23/16  6:09 PM  Result Value Ref Range Status   Specimen Description URINE, CATHETERIZED  Final   Special Requests Normal  Final   Culture NO GROWTH 1 DAY  Final   Report Status 01/25/2016 FINAL  Final  Culture, expectorated sputum-assessment     Status: None   Collection Time: 01/25/16  2:57 PM  Result Value Ref Range Status   Specimen Description SPUTUM  Final   Special Requests NONE  Final   Sputum evaluation THIS SPECIMEN IS ACCEPTABLE FOR SPUTUM CULTURE  Final   Report Status 01/25/2016 FINAL  Final  Culture, respiratory (NON-Expectorated)     Status: None   Collection Time: 01/25/16  2:57 PM  Result Value Ref Range Status   Specimen Description SPUTUM  Final   Special Requests NONE Reflexed from [id number]  Final   Gram Stain   Final    MODERATE WBC SEEN RARE SQUAMOUS EPITHELIAL CELLS PRESENT RARE GRAM POSITIVE COCCI FEW GRAM NEGATIVE RODS    Culture MODERATE GROWTH ESCHERICHIA COLI  Final   Report Status 01/27/2016 FINAL  Final   Organism ID, Bacteria ESCHERICHIA COLI  Final      Susceptibility   Escherichia coli - MIC*    AMPICILLIN Value in next row Resistant      RESISTANT>=32    CEFAZOLIN Value in next row Sensitive      SENSITIVE<=4    CEFEPIME Value in next row Sensitive      SENSITIVE<=1    CEFTAZIDIME Value in next row Sensitive      SENSITIVE<=1    CEFTRIAXONE Value in next row Sensitive      SENSITIVE<=1    CIPROFLOXACIN Value in next row Sensitive      SENSITIVE<=0.25    GENTAMICIN Value in next row Sensitive      SENSITIVE<=1    IMIPENEM Value in next row Sensitive      SENSITIVE<=0.25    TRIMETH/SULFA Value  in next row Resistant      RESISTANT>=320    AMPICILLIN/SULBACTAM Value in next row Sensitive      SENSITIVE4    PIP/TAZO Value in next row Sensitive      SENSITIVE<=4    Extended ESBL Value in next row Sensitive      SENSITIVE<=4    * MODERATE GROWTH ESCHERICHIA COLI    Medications:  Scheduled:  . ampicillin-sulbactam (UNASYN) IV  3 g Intravenous Q6H  . antiseptic oral rinse  7 mL Mouth Rinse BID  . bisacodyl  10 mg Rectal Once  . diazepam  5 mg Intravenous 6 times per day  . folic acid  1 mg Oral Daily  . heparin  5,000 Units Subcutaneous 3 times per day  . insulin aspart  2-6 Units Subcutaneous 6 times per day  . pantoprazole sodium  40 mg Per Tube Q24H  . PHENObarbital  100 mg Intravenous Once  . polyethylene glycol  17 g Oral BID  . potassium chloride  10 mEq Intravenous Q1 Hr x 3  . senna-docusate  2 tablet Oral BID  . sodium chloride flush  3 mL Intravenous Q12H  . thiamine  100 mg Oral Daily   Infusions:  . dexmedetomidine 2 mcg/kg/hr (01/28/16 1047)    Assessment: Pharmacy consulted for medication management for 55 yo male ICU patient requiring mechanical ventilation.    Plan:  1. Unasyn (8/14): Sputum culture growing E coli. After discussion with Dr. Nicholos Johns, will continue Unasyn for 14 days total.  Will continue patient on Unasyn 3g IV Q6hr.     2. Constipation: Patient intubated and sedated with no BM to date. Will continue senna-docusate 2 tab VT bid and Miralax bid. Patient is currently unable to swallow. Will give bisacodyl suppository x 1 and f/u AM.   3. Electrolytes: Potassium is low at 3.3. Will replace with potassium chloride 10 meq iv x 3. Will obtain follow-up electrolytes with am labs.    4. Glucose Management: Continue SSI coverage.    Pharmacy will continue to monitor and adjust per consult.    Luisa Hart D 01/28/2016,11:49 AM

## 2016-01-28 NOTE — Progress Notes (Signed)
eLink Physician-Brief Progress Note Patient Name: Duane Hayes DOB: 29-Sep-1961 MRN: [id number]   Date of Service  01/28/2016  HPI/Events of Note  Patient with agitated delirium on precedex at 1.2.  Has also received  of haldol and PRN ativan.  eICU Interventions  Plan: Increase precedex dose to 2.0  One time dose of Haldol 5 mg IV. Nurse to contact [facility] MD back in 30 minutes if patient remains agitated.     Intervention Category Major Interventions: Delirium, psychosis, severe agitation - evaluation and management  DETERDING,ELIZABETH 01/28/2016, 1:32 AM

## 2016-01-28 NOTE — Progress Notes (Signed)
Called E-Link and spoke with RN Maralyn Sago in regards to pt having a run on V-tach paired with a Triple. Per Dr. Molli Knock, Prime doctor should be called ASAP and possibly cardiology consult needed due to post Arrest and several sedation medications on board.

## 2016-01-28 NOTE — Progress Notes (Addendum)
[facility] Critical Care Medicine Progess Note    ASSESSMENT/PLAN   55 yo male found unresponsive in the field, received narcan, had CPR for 2 minutes, intubated, transferred to ICU for further management and care. Urine tox positive for tricyclics, cocaine, amphetamines; alcohol level greater than 100. Acute resp failure and acute encephalopathy from acute cocaine abuse  NEUROLOGIC Alcohol all with delirium tremens. -Currently on IV Haldol and Ativan, and oral Ativan, Valium, Precedex. -Continued significant agitation, discussed in multidisciplinary critical care rounds today, will add IV phenytoin 100 mg IV push 1, followed by 62 mg IV every 6 when necessary  PULMONARY Acute respiratory failure, aspiration pneumonia. Metabolic acidosis Drug overdose/polysubstance abuse P:  -Patients remains extubated. -Maintain O2 saturations greater than 88% -Escherichia coli in sputum, likely due to aspiration, continue antibiotics.  CARDIOVASCULAR -continue with hemodynamic monitoring  RENAL Metabolic Acidosis Appears improved.cont with IVF, thiamine, Folic Acid and MVI  HEMATOLOGIC -follow cbc  INFECTIOUS - currently on unasyn for aspiration pneumonia.   Unasyn>> 1/27>> expected end date is 2/10.   Sputum culture 1/31: Escherichia coli MRSA screen 1/27: Negative. Blood culture 1/27: Negative  ENDOCRINE -ICU hypo/hyperglycemic protocol    ---------------------------------------   ----------------------------------------   Name: AMADOR BRADDY MRN: [id number] DOB: 05/09/1961    ADMISSION DATE:  01/21/2016    CHIEF COMPLAINT: Dyspnea.    SUBJECTIVE:    patient is awake, alert, does not answer questions, does not follow commands. VITAL SIGNS: Temp:  [98.1 F (36.7 C)-100.2 F (37.9 C)] 98.6 F (37 C) (02/03 0800) Pulse Rate:  [56-194] 77 (02/03 0800) Resp:  [12-26] 19 (02/03 0800) BP: (121-174)/(63-100) 153/67 mmHg (02/03 0800) SpO2:  [75 %-100 %] 99  % (02/03 0800) Weight:  [156 lb 4.9 oz (70.9 kg)] 156 lb 4.9 oz (70.9 kg) (02/03 0500) HEMODYNAMICS:   VENTILATOR SETTINGS:   INTAKE / OUTPUT:  Intake/Output Summary (Last 24 hours) at 01/28/16 0900 Last data filed at 01/28/16 4098  Gross per 24 hour  Intake 911.91 ml  Output   6550 ml  Net -5638.09 ml    PHYSICAL EXAMINATION: Physical Examination:   VS: BP 153/67 mmHg  Pulse 77  Temp(Src) 98.6 F (37 C) (Core (Comment))  Resp 19  Ht  (1.727 m)  Wt 156 lb 4.9 oz (70.9 kg)  BMI 23.77 kg/m2  SpO2 99%  General Appearance: No distress  Neuro:without focal findings, mentalreduced  HEENT: PERRLA, EOM intact. Pulmonary: normal breath sounds   CardiovascularNormal S1,S2.  No m/r/g.   Abdomen: Benign, Soft, non-tender. Renal:  No costovertebral tenderness  GU:  Not performed at this time. Endocrine: No evident thyromegaly. Skin:   warm, no rashes, no ecchymosis  Extremities: normal, no cyanosis, clubbing.   LABS:   LABORATORY PANEL:   CBC  Recent Labs Lab 01/28/16 0453  WBC 9.7  HGB 11.0*  HCT 33.2*  PLT 277    Chemistries   Recent Labs Lab 01/22/16 0451  01/28/16 0453  NA 146*  < > 141  K 3.5  < > 3.3*  CL 112*  < > 107  CO2 31  < > 26  GLUCOSE 98  < > 118*  BUN 16  < > 9  CREATININE 0.97  < > 0.70  CALCIUM 8.0*  < > 8.5*  MG 1.8  < > 2.1  PHOS 2.7  < > 4.0  AST 24  --   --   ALT 26  --   --   ALKPHOS 78  --   --  BILITOT 0.8  --   --   < > = values in this interval not displayed.   Recent Labs Lab 01/27/16 1151 01/27/16 1550 01/27/16 2045 01/28/16 0005 01/28/16 0347 01/28/16 0809  GLUCAP 118* 117* 108* 85 114* 106*    Recent Labs Lab 01/21/16 0930 01/27/16 0409  PHART 7.37 7.52*  PCO2ART 47 38  PO2ART 108 50*    Recent Labs Lab 01/21/16 1348 01/22/16 0451  AST 36 24  ALT 32 26  ALKPHOS 84 78  BILITOT 1.0 0.8  ALBUMIN 3.2* 2.8*    Cardiac Enzymes No results for input(s): TROPONINI in the last 168  hours.  RADIOLOGY:  Dg Chest 1 View  01/27/2016  CLINICAL DATA:  Shortness of breath. EXAM: CHEST 1 VIEW COMPARISON:  01/25/2016. FINDINGS: Interim extubation removal of NG tube. Left PICC line in stable position. Heart size stable. Low lung volumes with mild bibasilar atelectasis and/or infiltrates, slight interim clearing. No pleural effusion or pneumothorax. IMPRESSION: 1. Interim extubation removal of NG tube. Left PICC line in stable position. 2. Low lung volumes with mild bibasilar atelectasis and/or infiltrates, slight interim clearing. Electronically Signed   By: Maisie Fus  Register   On: 01/27/2016 07:31       --Wells Guiles, MD.  [facility]  Santiago Glad, M.D.  Stephanie Acre, M.D.  Billy Fischer, M.D  Critical Care Attestation.  I have personally obtained a history, examined the patient, evaluated laboratory and imaging results, formulated the assessment and plan and placed orders. The Patient requires high complexity decision making for assessment and support, frequent evaluation and titration of therapies, application of advanced monitoring technologies and extensive interpretation of multiple databases. The patient has critical illness that could lead imminently to failure of 1 or more organ systems and requires the highest level of physician preparedness to intervene.  Critical Care Time devoted to patient care services described in this note is 35 minutes and is exclusive of time spent in procedures.

## 2016-01-28 NOTE — Progress Notes (Signed)
[facility] [place] at [facility]   PATIENT NAME: Duane Hayes    MR#:  [id number]  DATE OF BIRTH:  1961/10/23  SUBJECTIVE:  CHIEF COMPLAINT:   Chief Complaint  Patient presents with  . Drug Overdose   - very uncontrollably agitated, in spite of precedex drip, ativan and haldol IV prn - no other focal neuro deficits  REVIEW OF SYSTEMS:  Review of Systems  Unable to perform ROS: critical illness    DRUG ALLERGIES:  No Known Allergies  VITALS:  Blood pressure 130/86, pulse 71, temperature 99 F (37.2 C), temperature source Core (Comment), resp. rate 20, height  (1.727 m), weight 70.9 kg (156 lb 4.9 oz), SpO2 99 %.  PHYSICAL EXAMINATION:  Physical Exam  GENERAL:  55 y.o.-year-old patient lying in the bed, very agitated and restless. Sitter at bedside.  EYES: Pupils equal, round, reactive to light and accommodation. No scleral icterus. Extraocular muscles intact.  HEENT: Head atraumatic, normocephalic. Oropharynx and nasopharynx clear.  NECK:  Supple, no jugular venous distention. No thyroid enlargement, no tenderness.  LUNGS: Normal breath sounds bilaterally, no wheezing, rales,rhonchi or crepitation. No use of accessory muscles of respiration. Decreased bibasilar breath sounds. CARDIOVASCULAR: S1, S2 normal. No murmurs, rubs, or gallops.  ABDOMEN: Soft, nontender, nondistended. Bowel sounds present. No organomegaly or mass.  EXTREMITIES: No pedal edema, cyanosis, or clubbing.  NEUROLOGIC: Not cooperative for neuro exam. But able to move all 4 extremities in bed. No focal neuro deficits noted so far. PSYCHIATRIC: The patient is very confused and not oriented at all.  SKIN: No obvious rash, lesion, or ulcer.    LABORATORY PANEL:   CBC  Recent Labs Lab 01/28/16 0453  WBC 9.7  HGB 11.0*  HCT 33.2*  PLT 277    ------------------------------------------------------------------------------------------------------------------  Chemistries   Recent Labs Lab 01/22/16 0451  01/28/16 0453  NA 146*  < > 141  K 3.5  < > 3.3*  CL 112*  < > 107  CO2 31  < > 26  GLUCOSE 98  < > 118*  BUN 16  < > 9  CREATININE 0.97  < > 0.70  CALCIUM 8.0*  < > 8.5*  MG 1.8  < > 2.1  AST 24  --   --   ALT 26  --   --   ALKPHOS 78  --   --   BILITOT 0.8  --   --   < > = values in this interval not displayed. ------------------------------------------------------------------------------------------------------------------  Cardiac Enzymes No results for input(s): TROPONINI in the last 168 hours. ------------------------------------------------------------------------------------------------------------------  RADIOLOGY:  Dg Chest 1 View  01/27/2016  CLINICAL DATA:  Shortness of breath. EXAM: CHEST 1 VIEW COMPARISON:  01/25/2016. FINDINGS: Interim extubation removal of NG tube. Left PICC line in stable position. Heart size stable. Low lung volumes with mild bibasilar atelectasis and/or infiltrates, slight interim clearing. No pleural effusion or pneumothorax. IMPRESSION: 1. Interim extubation removal of NG tube. Left PICC line in stable position. 2. Low lung volumes with mild bibasilar atelectasis and/or infiltrates, slight interim clearing. Electronically Signed   By: Maisie Fus  Register   On: 01/27/2016 07:31    EKG:   Orders placed or performed during the hospital encounter of 01/21/16  . ED EKG  . ED EKG  . EKG 12-Lead  . EKG 12-Lead    ASSESSMENT AND PLAN:   55 year old male was noted to be unconscious in a motel room. He was intubated for airway protection. Possible overdose  suspected  1.acute respiratory failure : due to Drug Overdose and alcohol intoxication; And aspiration pneumonia - Spurum cultures with E.coli - Appreciate pulmonary critical care consultation. -Patient is extubated and currently  remains on nasal cannula. -negative blood cultures so far. -Chest x-ray from showing only low lung volumes. Infiltrate has been cleared. -On IV Unasyn. Total 14 days treatment per Intensivist   2.cardiac arrest with hypothermia/very brief CPR; hypothermia and bradycardia are resolved.  3. Hypokalemia- replaced  4.drug overdose-possible suicidal attempt;according to sister. continue sitter. Psych consult once more alert. - Urine tox when admitted was positive for amphetamines, cocaine, marijuana and tricyclic antidepressants.  5.Delirium tremens-significantly delirious, intoxicated with alcohol and presented. Significant use of alcohol use and also drug abuse. -Continue Precedex drip for now. Also Ativan and Haldol when necessary - As he is unable to be controlled by above meds- intensive care physician ordered IV phenobarb -If cannot be controlled, might have to be reintubated for sedation.   6.h/o ETOH abuse;continue Thiamine and folic acid,  -On Precedex and Ativan, haldol for withdrawals    All the records are reviewed and case discussed with Care Management/Social Workerr. Management plans discussed with the patient, family and they are in agreement.  CODE STATUS: Full Code  TOTAL CRITICAL CARE TIME SPENT IN TAKING CARE OF THIS PATIENT: 37 minutes.   POSSIBLE D/C IN several days, DEPENDING ON CLINICAL CONDITION.   Enid Baas M.D on 01/28/2016 at 2:27 PM  Between 7am to 6pm - Pager - [phone]  After 6pm go to [email or web address] - password [email or web address] [facility]  [place] [facility]  Office  [phone]  CC: Primary care physician; No PCP Per Patient

## 2016-01-28 NOTE — Progress Notes (Signed)
Called and spoke with [facility] on call Dr. Imogene Burn. Informed of Dr. Percival Spanish suggestions. Dr. Imogene Burn stated to place EKG order and Cardiology consult.

## 2016-01-28 NOTE — Clinical Documentation Improvement (Signed)
Internal Medicine Critical Care  Can the diagnosis of pressure ulcer (noted on hospital problem list)  be further specified site and stage? Thank you    Document Site with laterality - Elbow, Back (upper/lower), Sacral, Hip, Buttock, Ankle, Heel, Head, Other (Specify)  Pressure Ulcer Stage - Stage1, Stage 2, Stage 3, Stage 4, Unstageable, Unspecified, Unable to Clinically Determine  Other  Clinically Undetermined    Supporting Information:  01/25/16 Nurses's Assessment : Ischial Tuberosity, left,  stage II   Please exercise your independent, professional judgment when responding. A specific answer is not anticipated or expected.   Thank You,  Lavonda Jumbo Health Information Management [place] [phone]

## 2016-01-29 DIAGNOSIS — R451 Restlessness and agitation: Secondary | ICD-10-CM

## 2016-01-29 DIAGNOSIS — R400 Somnolence: Secondary | ICD-10-CM

## 2016-01-29 LAB — BASIC METABOLIC PANEL
Anion gap: 6 (ref 5–15)
Anion gap: 10 (ref 5–15)
BUN: 9 mg/dL (ref 6–20)
BUN: 14 mg/dL (ref 6–20)
CALCIUM: 6.8 mg/dL — AB (ref 8.9–10.3)
CO2: 19 mmol/L — ABNORMAL LOW (ref 22–32)
CO2: 23 mmol/L (ref 22–32)
CREATININE: 0.66 mg/dL (ref 0.61–1.24)
Calcium: 8.8 mg/dL — ABNORMAL LOW (ref 8.9–10.3)
Chloride: 115 mmol/L — ABNORMAL HIGH (ref 101–111)
Chloride: 106 mmol/L (ref 101–111)
Creatinine, Ser: 0.48 mg/dL — ABNORMAL LOW (ref 0.61–1.24)
GFR calc Af Amer: >60 mL/min (ref >60)
GFR calc Af Amer: >60 mL/min (ref >60)
Glucose, Bld: 95 mg/dL (ref 65–99)
Glucose, Bld: 111 mg/dL — ABNORMAL HIGH (ref 65–99)
POTASSIUM: 2.7 mmol/L — AB (ref 3.5–5.1)
POTASSIUM: 4.2 mmol/L (ref 3.5–5.1)
SODIUM: 140 mmol/L (ref 135–145)
SODIUM: 139 mmol/L (ref 135–145)

## 2016-01-29 LAB — GLUCOSE, CAPILLARY
Glucose-Capillary: 108 mg/dL — ABNORMAL HIGH (ref 65–99)
Glucose-Capillary: 83 mg/dL (ref 65–99)

## 2016-01-29 LAB — CBC
HCT: 31 % — ABNORMAL LOW (ref 40.0–52.0)
Hemoglobin: 10.6 g/dL — ABNORMAL LOW (ref 13.0–18.0)
MCH: 31.5 pg (ref 26.0–34.0)
MCHC: 34.1 g/dL (ref 32.0–36.0)
MCV: 92.4 fL (ref 80.0–100.0)
PLATELETS: 275 10*3/uL (ref 150–440)
RBC: 3.36 MIL/uL — AB (ref 4.40–5.90)
RDW: 13.7 % (ref 11.5–14.5)
WBC: 9.8 10*3/uL (ref 3.8–10.6)

## 2016-01-29 LAB — PHOSPHORUS: Phosphorus: 4.2 mg/dL (ref 2.5–4.6)

## 2016-01-29 LAB — MAGNESIUM: MAGNESIUM: 2.1 mg/dL (ref 1.7–2.4)

## 2016-01-29 MED ORDER — CETYLPYRIDINIUM CHLORIDE 0.05 % MT LIQD
7.0000 mL | Freq: Two times a day (BID) | OROMUCOSAL | Status: DC
  Administered 2016-01-29 – 2016-01-30 (×4): 7 mL via OROMUCOSAL

## 2016-01-29 MED ORDER — CHLORHEXIDINE GLUCONATE 0.12 % MT SOLN
15.0000 mL | Freq: Two times a day (BID) | OROMUCOSAL | Status: DC
  Administered 2016-01-29 – 2016-01-31 (×5): 15 mL via OROMUCOSAL
  Filled 2016-01-29 (×5): qty 15

## 2016-01-29 MED ORDER — SODIUM CHLORIDE 0.9 % IV SOLN
INTRAVENOUS | Status: DC
  Administered 2016-01-29 – 2016-01-30 (×3): via INTRAVENOUS

## 2016-01-29 MED ORDER — POTASSIUM CHLORIDE 10 MEQ/100ML IV SOLN
10.0000 meq | INTRAVENOUS | Status: AC
  Administered 2016-01-29 (×6): 10 meq via INTRAVENOUS
  Filled 2016-01-29 (×6): qty 100

## 2016-01-29 NOTE — Progress Notes (Signed)
PHARMACY - CRITICAL CARE PROGRESS NOTE  Pharmacy Consult for Unasyn (8/14), Constipation Prevention, Electrolyte Replacement and Glucose Management    No Known Allergies  Patient Measurements: Height:  (172.7 cm) Weight: 156 lb 4.9 oz (70.9 kg) IBW/kg (Calculated) : 68.4   Vital Signs: Temp: 98.6 F (37 C) (02/04 0500) Temp Source: Other (Comment) (02/04 0300) BP: 144/92 mmHg (02/04 0500) Pulse Rate: 63 (02/04 0500) Intake/Output from previous day: 02/03 0701 - 02/04 0700 In: 1480.4 [I.V.:780.4; IV Piggyback:700] Out: 3175 [Urine:3175] Intake/Output from this shift: Total I/O In: 555 [I.V.:355; IV Piggyback:200] Out: 1125 [Urine:1125] Vent settings for last 24 hours:    Labs:  Recent Labs  01/27/16 0124 01/27/16 0127 01/28/16 0453 01/29/16 0422  WBC  --  8.5 9.7 9.8  HGB  --  9.7* 11.0* 10.6*  HCT  --  29.2* 33.2* 31.0*  PLT  --  207 277 275  CREATININE 0.57*  --  0.70 0.48*  MG  --   --  2.1  --   PHOS  --   --  4.0  --    Estimated Creatinine Clearance: 102.1 mL/min (by C-G formula based on Cr of 0.48).   Recent Labs  01/28/16 0809 01/28/16 1207 01/28/16 2029  GLUCAP 106* 125* 106*    Microbiology: Recent Results (from the past 720 hour(s))  Blood culture (routine x 2)     Status: None   Collection Time: 01/21/16  4:20 AM  Result Value Ref Range Status   Specimen Description BLOOD RIGHT ANTECUBITAL  Final   Special Requests BOTTLES DRAWN AEROBIC AND ANAEROBIC  Final   Culture NO GROWTH 6 DAYS  Final   Report Status 01/27/2016 FINAL  Final  Blood culture (routine x 2)     Status: None   Collection Time: 01/21/16  4:20 AM  Result Value Ref Range Status   Specimen Description BLOOD LEFT WRIST  Final   Special Requests BOTTLES DRAWN AEROBIC AND ANAEROBIC  Final   Culture NO GROWTH 6 DAYS  Final   Report Status 01/27/2016 FINAL  Final  MRSA PCR Screening     Status: None   Collection Time: 01/21/16  8:51 AM  Result Value Ref Range  Status   MRSA by PCR NEGATIVE NEGATIVE Final    Comment:        The GeneXpert MRSA Assay (FDA approved for NASAL specimens only), is one component of a comprehensive MRSA colonization surveillance program. It is not intended to diagnose MRSA infection nor to guide or monitor treatment for MRSA infections.   Urine culture     Status: None   Collection Time: 01/23/16  6:09 PM  Result Value Ref Range Status   Specimen Description URINE, CATHETERIZED  Final   Special Requests Normal  Final   Culture NO GROWTH 1 DAY  Final   Report Status 01/25/2016 FINAL  Final  Culture, expectorated sputum-assessment     Status: None   Collection Time: 01/25/16  2:57 PM  Result Value Ref Range Status   Specimen Description SPUTUM  Final   Special Requests NONE  Final   Sputum evaluation THIS SPECIMEN IS ACCEPTABLE FOR SPUTUM CULTURE  Final   Report Status 01/25/2016 FINAL  Final  Culture, respiratory (NON-Expectorated)     Status: None   Collection Time: 01/25/16  2:57 PM  Result Value Ref Range Status   Specimen Description SPUTUM  Final   Special Requests NONE Reflexed from [id number]  Final   Gram Stain  Final    MODERATE WBC SEEN RARE SQUAMOUS EPITHELIAL CELLS PRESENT RARE GRAM POSITIVE COCCI FEW GRAM NEGATIVE RODS    Culture MODERATE GROWTH ESCHERICHIA COLI  Final   Report Status 01/27/2016 FINAL  Final   Organism ID, Bacteria ESCHERICHIA COLI  Final      Susceptibility   Escherichia coli - MIC*    AMPICILLIN Value in next row Resistant      RESISTANT>=32    CEFAZOLIN Value in next row Sensitive      SENSITIVE<=4    CEFEPIME Value in next row Sensitive      SENSITIVE<=1    CEFTAZIDIME Value in next row Sensitive      SENSITIVE<=1    CEFTRIAXONE Value in next row Sensitive      SENSITIVE<=1    CIPROFLOXACIN Value in next row Sensitive      SENSITIVE<=0.25    GENTAMICIN Value in next row Sensitive      SENSITIVE<=1    IMIPENEM Value in next row Sensitive      SENSITIVE<=0.25     TRIMETH/SULFA Value in next row Resistant      RESISTANT>=320    AMPICILLIN/SULBACTAM Value in next row Sensitive      SENSITIVE4    PIP/TAZO Value in next row Sensitive      SENSITIVE<=4    Extended ESBL Value in next row Sensitive      SENSITIVE<=4    * MODERATE GROWTH ESCHERICHIA COLI    Medications:  Scheduled:  . ampicillin-sulbactam (UNASYN) IV  3 g Intravenous Q6H  . antiseptic oral rinse  7 mL Mouth Rinse BID  . diazepam  5 mg Intravenous 6 times per day  . folic acid  1 mg Oral Daily  . heparin  5,000 Units Subcutaneous 3 times per day  . pantoprazole (PROTONIX) IV  40 mg Intravenous Q24H  . polyethylene glycol  17 g Oral BID  . potassium chloride  10 mEq Intravenous Q1 Hr x 6  . senna-docusate  2 tablet Oral BID  . sodium chloride flush  3 mL Intravenous Q12H  . thiamine  100 mg Oral Daily   Infusions:  . dexmedetomidine 2 mcg/kg/hr (01/29/16 4034)    Assessment: Pharmacy consulted for medication management for 55 yo male ICU patient requiring mechanical ventilation.    Plan:  K 2.7 this AM. Ordered potassium chloride 10 mEq IV Q1H x 6 doses and will repeat labs this afternoon.   Carola Frost, Pharm.D., BCPS Clinical Pharmacist 01/29/2016,5:35 AM

## 2016-01-29 NOTE — Progress Notes (Signed)
MEDICATION ELECTROLYTE CONSULT - FOLLOW UP  Pharmacy Consult for electrolyte management Indication: hypokalemia  No Known Allergies  Vital Signs: Temp: 97.2 F (36.2 C) (02/04 1400) Temp Source: Axillary (02/04 1400) BP: 118/80 mmHg (02/04 1600) Pulse Rate: 63 (02/04 1600) Intake/Output from previous day: 02/03 0701 - 02/04 0700 In: 1651.4 [I.V.:851.4; IV Piggyback:800] Out: 3700 [Urine:3700] Intake/Output from this shift: Total I/O In: 620.9 [I.V.:320.9; IV Piggyback:300] Out: 250 [Urine:250]  Labs:  Recent Labs  01/27/16 0127 01/28/16 0453 01/29/16 0422 01/29/16 1505  WBC 8.5 9.7 9.8  --   HGB 9.7* 11.0* 10.6*  --   HCT 29.2* 33.2* 31.0*  --   PLT 207 277 275  --   CREATININE  --  0.70 0.48* 0.66  MG  --  2.1  --  2.1  PHOS  --  4.0  --  4.2   Estimated Creatinine Clearance: 101.2 mL/min (by C-G formula based on Cr of 0.66).   Assessment: K/Mg/Phos within normal limits this afternoon after K was replaced this morning.  K of 4.2 WNL Mg of 2.1 WNL Phos 4.2 WNL  Goal of Therapy:  Electrolytes within normal limits  Plan:  Electrolytes within normal limits, no need for replacement orders.  Will check K/Mg/Phos with AM labs tomorrow.  Cindi Carbon, PharmD Clinical Pharmacist 01/29/2016,4:42 PM

## 2016-01-29 NOTE — Progress Notes (Signed)
[facility] - [place] at [facility]   PATIENT NAME: Duane Hayes    MR#:  [id number]  DATE OF BIRTH:  1961/08/23  SUBJECTIVE:  CHIEF COMPLAINT:   Chief Complaint  Patient presents with  . Drug Overdose   - Snoring today, just went to sleeping after agitation all night long - also noted to be coughing today  REVIEW OF SYSTEMS:  Review of Systems  Unable to perform ROS: critical illness    DRUG ALLERGIES:  No Known Allergies  VITALS:  Blood pressure 130/66, pulse 64, temperature 96.9 F (36.1 C), temperature source Axillary, resp. rate 26, height  (1.727 m), weight 67.8 kg (149 lb 7.6 oz), SpO2 95 %.  PHYSICAL EXAMINATION:  Physical Exam  GENERAL:  55 y.o.-year-old patient lying in the bed, snoring. Sitter at bedside.  EYES: Pupils equal, round, reactive to light and accommodation. No scleral icterus. Extraocular muscles intact.  HEENT: Head atraumatic, normocephalic. Oropharynx and nasopharynx clear.  NECK:  Supple, no jugular venous distention. No thyroid enlargement, no tenderness.  LUNGS: Normal breath sounds bilaterally, no wheezing, rales,rhonchi or crepitation. No use of accessory muscles of respiration. Decreased bibasilar breath sounds. CARDIOVASCULAR: S1, S2 normal. No murmurs, rubs, or gallops.  ABDOMEN: Soft, nontender, nondistended. Bowel sounds present. No organomegaly or mass.  EXTREMITIES: No pedal edema, cyanosis, or clubbing.  NEUROLOGIC: Not cooperative for neuro exam. But able to move all 4 extremities in bed. No focal neuro deficits noted so far. PSYCHIATRIC: The patient is very confused and not oriented at all.  SKIN: No obvious rash, lesion, or ulcer.    LABORATORY PANEL:   CBC  Recent Labs Lab 01/29/16 0422  WBC 9.8  HGB 10.6*  HCT 31.0*  PLT 275   ------------------------------------------------------------------------------------------------------------------  Chemistries   Recent Labs Lab  01/28/16 0453 01/29/16 0422  NA 141 140  K 3.3* 2.7*  CL 107 115*  CO2 26 19*  GLUCOSE 118* 95  BUN 9 9  CREATININE 0.70 0.48*  CALCIUM 8.5* 6.8*  MG 2.1  --    ------------------------------------------------------------------------------------------------------------------  Cardiac Enzymes No results for input(s): TROPONINI in the last 168 hours. ------------------------------------------------------------------------------------------------------------------  RADIOLOGY:  No results found.  EKG:   Orders placed or performed during the hospital encounter of 01/21/16  . ED EKG  . ED EKG  . EKG 12-Lead  . EKG 12-Lead  . EKG 12-Lead  . EKG 12-Lead  . EKG 12-Lead  . EKG 12-Lead    ASSESSMENT AND PLAN:   55 year old male was noted to be unconscious in a motel room. He was intubated for airway protection. Possible overdose suspected  1.acute respiratory failure : due to Drug Overdose and alcohol intoxication; And aspiration pneumonia - Spurum cultures with E.coli - Appreciate pulmonary critical care consultation. -Patient is extubated and currently remains on nasal cannula. -negative blood cultures so far. -Chest x-ray from showing only low lung volumes. Infiltrate has been cleared. -On IV Unasyn. Total 14 days treatment per Intensivist  - repeat CXR tomorrow as coughing noted today  2.cardiac arrest with hypothermia/very brief CPR; hypothermia and bradycardia are resolved.  3. Hypokalemia- replaced  4.drug overdose-possible suicidal attempt;according to sister. continue sitter. Psych consult once more alert. - Urine tox when admitted was positive for amphetamines, cocaine, marijuana and tricyclic antidepressants.  5.Delirium tremens-significantly delirious, intoxicated with alcohol and presented. Significant use of alcohol use and also drug abuse. -Continue Precedex drip for now. Also Ativan and Haldol when necessary - As he is unable to be  controlled by above  meds- intensive care physician ordered IV phenobarb -If cannot be controlled, might have to be reintubated for sedation.   6.h/o ETOH abuse;continue Thiamine and folic acid,  -On Precedex and Ativan, haldol for withdrawals    All the records are reviewed and case discussed with Care Management/Social Workerr. Management plans discussed with the patient, family and they are in agreement.  CODE STATUS: Full Code  TOTAL CRITICAL CARE TIME SPENT IN TAKING CARE OF THIS PATIENT: 33 minutes.   POSSIBLE D/C IN several days, DEPENDING ON CLINICAL CONDITION.   Amylynn Fano M.D on 01/29/2016 at 10:39 AM  Between 7am to 6pm - Pager - [phone]  After 6pm go to [email or web address] - password [email or web address] [facility]  [place] [facility]  Office  [phone]  CC: Primary care physician; No PCP Per Patient

## 2016-01-29 NOTE — Progress Notes (Signed)
Critical potassium of 2.7. Orders for pharmacy to manage Pt's electrolytes. Pharmacist notified.

## 2016-01-29 NOTE — Progress Notes (Signed)
[facility] Critical Care Medicine Progess Note    ASSESSMENT/PLAN   55 yo male found unresponsive in the field, received narcan, had CPR for 2 minutes, intubated, transferred to ICU for further management and care. Urine tox positive for tricyclics, cocaine, amphetamines; alcohol level greater than 100. Now extubated, but not back to baseline mentation, has waxing and waning periods of agitation followed by somnolence.   Acute resp failure and acute encephalopathy from acute cocaine abuse  NEUROLOGIC Alcohol all with delirium tremens. -Currently on IV Haldol and Ativan, and oral Ativan, Valium, Precedex. -Continued significant agitation, discussed in multidisciplinary critical care rounds today, and IV phenytoin every 6 when necessary  PULMONARY Acute respiratory failure, aspiration pneumonia. Metabolic acidosis Drug overdose/polysubstance abuse P:  -Patients remains extubated. -Maintain O2 saturations greater than 88% -Escherichia coli in sputum, likely due to aspiration, continue antibiotics.  CARDIOVASCULAR - had some runs of vtach during the night, currently on significant antipsychiotics, potassium low but being replaced\ -continue with hemodynamic monitoring  RENAL Metabolic Acidosis Hypokalemia Appears improved.cont with IVF, thiamine, Folic Acid and MVI K replaced  HEMATOLOGIC -follow cbc  INFECTIOUS - currently on unasyn for aspiration pneumonia.   Unasyn>> 1/27>> expected end date is 2/10.   Sputum culture 1/31: Escherichia coli MRSA screen 1/27: Negative. Blood culture 1/27: Negative  ENDOCRINE -ICU hypo/hyperglycemic protocol    ---------------------------------------   ----------------------------------------   Name: Duane Hayes MRN: [id number] DOB: November 18, 1961    ADMISSION DATE:  01/21/2016    CHIEF COMPLAINT: Dyspnea.    SUBJECTIVE:    patient, this morning, and time by examination it is received his Valium, precedex now  turned down to 1.6. Significant snoring noted VITAL SIGNS: Temp:  [96.9 F (36.1 C)-100.2 F (37.9 C)] 96.9 F (36.1 C) (02/04 0700) Pulse Rate:  [56-90] 58 (02/04 1100) Resp:  [13-29] 22 (02/04 1100) BP: (108-160)/(54-101) 126/64 mmHg (02/04 1100) SpO2:  [91 %-100 %] 94 % (02/04 1100) Weight:  [149 lb 7.6 oz (67.8 kg)] 149 lb 7.6 oz (67.8 kg) (02/04 0500) HEMODYNAMICS:   VENTILATOR SETTINGS:   INTAKE / OUTPUT:  Intake/Output Summary (Last 24 hours) at 01/29/16 1215 Last data filed at 01/29/16 1146  Gross per 24 hour  Intake 1544.98 ml  Output   2400 ml  Net -855.02 ml    PHYSICAL EXAMINATION: Physical Examination:   VS: BP 126/64 mmHg  Pulse 58  Temp(Src) 96.9 F (36.1 C) (Axillary)  Resp 22  Ht  (1.727 m)  Wt 149 lb 7.6 oz (67.8 kg)  BMI 22.73 kg/m2  SpO2 94%  General Appearance: No distress  Neuro:without focal findings, mental reduced  HEENT: PERRLA, EOM intact. Pulmonary: normal breath sounds , significant snoring  CardiovascularNormal S1,S2.  No m/r/g.   Abdomen: Benign, Soft, non-tender. Renal:  No costovertebral tenderness  GU:  Not performed at this time. Endocrine: No evident thyromegaly. Skin:   warm, no rashes, no ecchymosis  Extremities: normal, no cyanosis, clubbing.   LABS:   LABORATORY PANEL:   CBC  Recent Labs Lab 01/29/16 0422  WBC 9.8  HGB 10.6*  HCT 31.0*  PLT 275    Chemistries   Recent Labs Lab 01/28/16 0453 01/29/16 0422  NA 141 140  K 3.3* 2.7*  CL 107 115*  CO2 26 19*  GLUCOSE 118* 95  BUN 9 9  CREATININE 0.70 0.48*  CALCIUM 8.5* 6.8*  MG 2.1  --   PHOS 4.0  --      Recent Labs Lab 01/28/16 0005  01/28/16 0347 01/28/16 0809 01/28/16 1207 01/28/16 2029 01/29/16 0904  GLUCAP 85 114* 106* 125* 106* 108*    Recent Labs Lab 01/27/16 0409  PHART 7.52*  PCO2ART 38  PO2ART 50*   No results for input(s): AST, ALT, ALKPHOS, BILITOT, ALBUMIN in the last 168 hours.  Cardiac Enzymes No results  for input(s): TROPONINI in the last 168 hours.  RADIOLOGY:  No results found.     I have personally obtained a history, examined the patient, evaluated laboratory and imaging results, formulated the assessment and plan and placed orders. CRITICAL CARE: The patient is critically ill with multiple organ systems failure and requires high complexity decision making for assessment and support, frequent evaluation and titration of therapies, application of advanced monitoring technologies and extensive interpretation of multiple databases. Critical Care Time devoted to patient care services described in this note is 40 minutes.    Stephanie Acre, MD [facility] Pager [phone] (please enter 7-digits) On Call Pager - [phone] (please enter 7-digits)

## 2016-01-30 DIAGNOSIS — R4182 Altered mental status, unspecified: Secondary | ICD-10-CM

## 2016-01-30 DIAGNOSIS — R410 Disorientation, unspecified: Secondary | ICD-10-CM

## 2016-01-30 LAB — PHOSPHORUS: PHOSPHORUS: 3.5 mg/dL (ref 2.5–4.6)

## 2016-01-30 LAB — BASIC METABOLIC PANEL
ANION GAP: 12 (ref 5–15)
BUN: 14 mg/dL (ref 6–20)
CALCIUM: 8.5 mg/dL — AB (ref 8.9–10.3)
CO2: 22 mmol/L (ref 22–32)
CREATININE: 0.67 mg/dL (ref 0.61–1.24)
Chloride: 106 mmol/L (ref 101–111)
GFR calc Af Amer: >60 mL/min (ref >60)
GLUCOSE: 106 mg/dL — AB (ref 65–99)
Potassium: 3.6 mmol/L (ref 3.5–5.1)
Sodium: 140 mmol/L (ref 135–145)

## 2016-01-30 LAB — GLUCOSE, CAPILLARY
Glucose-Capillary: 106 mg/dL — ABNORMAL HIGH (ref 65–99)
Glucose-Capillary: 102 mg/dL — ABNORMAL HIGH (ref 65–99)

## 2016-01-30 LAB — MAGNESIUM: Magnesium: 2 mg/dL (ref 1.7–2.4)

## 2016-01-30 MED ORDER — LORAZEPAM 2 MG/ML IJ SOLN
1.0000 mg | INTRAMUSCULAR | Status: DC | PRN
  Administered 2016-01-30 – 2016-02-03 (×17): 2 mg via INTRAVENOUS
  Filled 2016-01-30 (×19): qty 1

## 2016-01-30 MED ORDER — BISACODYL 10 MG RE SUPP: 10.0000 mg | Freq: Once | RECTAL | Status: DC

## 2016-01-30 NOTE — Progress Notes (Addendum)
PHARMACY - CRITICAL CARE PROGRESS NOTE  Pharmacy Consult for Unasyn (10/14), Constipation Prevention, Electrolyte Replacement and Glucose Management    No Known Allergies  Patient Measurements: Height:  (172.7 cm) Weight: 133 lb 6.1 oz (60.5 kg) IBW/kg (Calculated) : 68.4   Vital Signs: Temp: 97.9 F (36.6 C) (02/05 0700) Temp Source: Oral (02/05 0700) BP: 137/93 mmHg (02/05 0900) Pulse Rate: 62 (02/05 0900) Intake/Output from previous day: 02/04 0701 - 02/05 0700 In: 2517.9 [I.V.:1917.9; IV Piggyback:600] Out: 1325 [Urine:1325] Intake/Output from this shift: Total I/O In: 94.5 [I.V.:94.5] Out: 400 [Urine:400] Vent settings for last 24 hours:    Labs:  Recent Labs  01/28/16 0453 01/29/16 0422 01/29/16 1505 01/30/16 0522  WBC 9.7 9.8  --   --   HGB 11.0* 10.6*  --   --   HCT 33.2* 31.0*  --   --   PLT 277 275  --   --   CREATININE 0.70 0.48* 0.66 0.67  MG 2.1  --  2.1 2.0  PHOS 4.0  --  4.2 3.5   Estimated Creatinine Clearance: 90.3 mL/min (by C-G formula based on Cr of 0.67).   Recent Labs  01/29/16 0904 01/29/16 2120 01/30/16 0830  GLUCAP 108* 83 106*    Microbiology: Recent Results (from the past 720 hour(s))  Blood culture (routine x 2)     Status: None   Collection Time: 01/21/16  4:20 AM  Result Value Ref Range Status   Specimen Description BLOOD RIGHT ANTECUBITAL  Final   Special Requests BOTTLES DRAWN AEROBIC AND ANAEROBIC  Final   Culture NO GROWTH 6 DAYS  Final   Report Status 01/27/2016 FINAL  Final  Blood culture (routine x 2)     Status: None   Collection Time: 01/21/16  4:20 AM  Result Value Ref Range Status   Specimen Description BLOOD LEFT WRIST  Final   Special Requests BOTTLES DRAWN AEROBIC AND ANAEROBIC  Final   Culture NO GROWTH 6 DAYS  Final   Report Status 01/27/2016 FINAL  Final  MRSA PCR Screening     Status: None   Collection Time: 01/21/16  8:51 AM  Result Value Ref Range Status   MRSA by PCR NEGATIVE  NEGATIVE Final    Comment:        The GeneXpert MRSA Assay (FDA approved for NASAL specimens only), is one component of a comprehensive MRSA colonization surveillance program. It is not intended to diagnose MRSA infection nor to guide or monitor treatment for MRSA infections.   Urine culture     Status: None   Collection Time: 01/23/16  6:09 PM  Result Value Ref Range Status   Specimen Description URINE, CATHETERIZED  Final   Special Requests Normal  Final   Culture NO GROWTH 1 DAY  Final   Report Status 01/25/2016 FINAL  Final  Culture, expectorated sputum-assessment     Status: None   Collection Time: 01/25/16  2:57 PM  Result Value Ref Range Status   Specimen Description SPUTUM  Final   Special Requests NONE  Final   Sputum evaluation THIS SPECIMEN IS ACCEPTABLE FOR SPUTUM CULTURE  Final   Report Status 01/25/2016 FINAL  Final  Culture, respiratory (NON-Expectorated)     Status: None   Collection Time: 01/25/16  2:57 PM  Result Value Ref Range Status   Specimen Description SPUTUM  Final   Special Requests NONE Reflexed from [id number]  Final   Gram Stain   Final  MODERATE WBC SEEN RARE SQUAMOUS EPITHELIAL CELLS PRESENT RARE GRAM POSITIVE COCCI FEW GRAM NEGATIVE RODS    Culture MODERATE GROWTH ESCHERICHIA COLI  Final   Report Status 01/27/2016 FINAL  Final   Organism ID, Bacteria ESCHERICHIA COLI  Final      Susceptibility   Escherichia coli - MIC*    AMPICILLIN Value in next row Resistant      RESISTANT>=32    CEFAZOLIN Value in next row Sensitive      SENSITIVE<=4    CEFEPIME Value in next row Sensitive      SENSITIVE<=1    CEFTAZIDIME Value in next row Sensitive      SENSITIVE<=1    CEFTRIAXONE Value in next row Sensitive      SENSITIVE<=1    CIPROFLOXACIN Value in next row Sensitive      SENSITIVE<=0.25    GENTAMICIN Value in next row Sensitive      SENSITIVE<=1    IMIPENEM Value in next row Sensitive      SENSITIVE<=0.25    TRIMETH/SULFA Value in next  row Resistant      RESISTANT>=320    AMPICILLIN/SULBACTAM Value in next row Sensitive      SENSITIVE4    PIP/TAZO Value in next row Sensitive      SENSITIVE<=4    Extended ESBL Value in next row Sensitive      SENSITIVE<=4    * MODERATE GROWTH ESCHERICHIA COLI    Medications:  Scheduled:  . ampicillin-sulbactam (UNASYN) IV  3 g Intravenous Q6H  . antiseptic oral rinse  7 mL Mouth Rinse q12n4p  . chlorhexidine  15 mL Mouth Rinse BID  . diazepam  5 mg Intravenous 6 times per day  . folic acid  1 mg Oral Daily  . heparin  5,000 Units Subcutaneous 3 times per day  . pantoprazole (PROTONIX) IV  40 mg Intravenous Q24H  . polyethylene glycol  17 g Oral BID  . senna-docusate  2 tablet Oral BID  . sodium chloride flush  3 mL Intravenous Q12H  . thiamine  100 mg Oral Daily   Infusions:  . sodium chloride 75 mL/hr at 01/30/16 0700  . dexmedetomidine 1.4 mcg/kg/hr (01/30/16 0913)    Assessment: Pharmacy consulted for medication management for 55 yo male ICU patient previously requiring mechanical ventilation now extubated.  Plan:  1. Unasyn (10/14): Sputum culture growing E coli. After discussion with Dr. Nicholos Johns, will continue Unasyn for 14 days total.  Will continue patient on Unasyn 3g IV Q6hr.     2. Constipation: Patient still has no BM to date. Patient has order for Miralax and Senna docusate as well. Patient has not had BM due to not eating per RN who spoke with MD Nemiah Commander. Will continue with PRN orders for now.   3. Electrolytes:All within normal limits.   4. Glucose Management: Continue SSI coverage.    Pharmacy will continue to monitor and adjust per consult.    Mairi Stagliano D 01/30/2016,9:27 AM

## 2016-01-30 NOTE — Progress Notes (Signed)
[facility] - [place] at [facility]   PATIENT NAME: Duane Hayes    MR#:  [id number]  DATE OF BIRTH:  Aug 13, 1961  SUBJECTIVE:  CHIEF COMPLAINT:   Chief Complaint  Patient presents with  . Drug Overdose   - sedated significantly and snoring. But in between woke up and said a few words to RN, very delirious still - decreased cough  REVIEW OF SYSTEMS:  Review of Systems  Unable to perform ROS: critical illness    DRUG ALLERGIES:  No Known Allergies  VITALS:  Blood pressure 137/93, pulse 62, temperature 97.9 F (36.6 C), temperature source Oral, resp. rate 14, height  (1.727 m), weight 60.5 kg (133 lb 6.1 oz), SpO2 97 %.  PHYSICAL EXAMINATION:  Physical Exam  GENERAL:  55 y.o.-year-old patient lying in the bed, snoring. Sitter at bedside.  EYES: Pupils equal, round, reactive to light and accommodation. No scleral icterus. Extraocular muscles intact.  HEENT: Head atraumatic, normocephalic. Oropharynx and nasopharynx clear.  NECK:  Supple, no jugular venous distention. No thyroid enlargement, no tenderness.  LUNGS: Normal breath sounds bilaterally, no wheezing, rales,rhonchi or crepitation. No use of accessory muscles of respiration. Decreased bibasilar breath sounds. CARDIOVASCULAR: S1, S2 normal. No murmurs, rubs, or gallops.  ABDOMEN: Soft, nontender, nondistended. Bowel sounds present. No organomegaly or mass.  EXTREMITIES: No pedal edema, cyanosis, or clubbing.  NEUROLOGIC: Not cooperative for neuro exam. But able to move all 4 extremities in bed. No focal neuro deficits noted so far. PSYCHIATRIC: The patient is very confused and not oriented at all.  SKIN: No obvious rash, lesion, or ulcer.    LABORATORY PANEL:   CBC  Recent Labs Lab 01/29/16 0422  WBC 9.8  HGB 10.6*  HCT 31.0*  PLT 275   ------------------------------------------------------------------------------------------------------------------  Chemistries   Recent  Labs Lab 01/30/16 0522  NA 140  K 3.6  CL 106  CO2 22  GLUCOSE 106*  BUN 14  CREATININE 0.67  CALCIUM 8.5*  MG 2.0   ------------------------------------------------------------------------------------------------------------------  Cardiac Enzymes No results for input(s): TROPONINI in the last 168 hours. ------------------------------------------------------------------------------------------------------------------  RADIOLOGY:  No results found.  EKG:   Orders placed or performed during the hospital encounter of 01/21/16  . ED EKG  . ED EKG  . EKG 12-Lead  . EKG 12-Lead  . EKG 12-Lead  . EKG 12-Lead  . EKG 12-Lead  . EKG 12-Lead    ASSESSMENT AND PLAN:   55 year old male was noted to be unconscious in a motel room. He was intubated for airway protection. Possible overdose suspected  1.acute respiratory failure : due to Drug Overdose and alcohol intoxication; And aspiration pneumonia - Spurum cultures with E.coli - Appreciate pulmonary critical care consultation. -Patient is extubated and currently remains on nasal cannula. -negative blood cultures so far. -Chest x-ray from showing only low lung volumes. Infiltrate has been cleared. -On IV Unasyn. Total 14 days treatment per Intensivist  - repeat CXR f/u  2.cardiac arrest with hypothermia/very brief CPR; hypothermia and bradycardia are resolved.  3. Hypokalemia- replaced  4.Drug overdose-possible suicidal attempt; according to sister. continue sitter.  -Psych consult once more alert. - Urine tox when admitted was positive for amphetamines, cocaine, marijuana and tricyclic antidepressants.  5.Delirium tremens-significantly delirious, intoxicated with alcohol and presented. Significant use of alcohol use and also drug abuse. -Continue Precedex drip for now. Also Ativan and Haldol when necessary- wean as very sedated - IV phenobarb for agitation  6.h/o ETOH abuse; continue Thiamine and folic acid,  -  On  Precedex and Ativan, haldol for withdrawals    All the records are reviewed and case discussed with Care Management/Social Workerr. Management plans discussed with the patient, family and they are in agreement.  CODE STATUS: Full Code  TOTAL CRITICAL CARE TIME SPENT IN TAKING CARE OF THIS PATIENT: 33 minutes.   POSSIBLE D/C IN several days, DEPENDING ON CLINICAL CONDITION.   Malvern Kadlec M.D on 01/30/2016 at 12:46 PM  Between 7am to 6pm - Pager - [phone]  After 6pm go to [email or web address] - password [email or web address] [facility]  [facility]  Office  [phone]  CC: Primary care physician; No PCP Per Patient

## 2016-01-30 NOTE — Progress Notes (Signed)
[facility] Critical Care Medicine Progess Note    ASSESSMENT/PLAN   55 yo male found unresponsive in the field, received narcan, had CPR for 2 minutes, intubated, transferred to ICU for further management and care. Urine tox positive for tricyclics, cocaine, amphetamines; alcohol level greater than 100. Now extubated, but not back to baseline mentation, has waxing and waning periods of agitation followed by somnolence.   Acute resp failure and acute encephalopathy from acute cocaine abuse  NEUROLOGIC Alcohol all with delirium tremens. -Currently on IV Haldol and Ativan, and oral Ativan, Valium, Precedex.  Starting wean prn Ativan due to being oversedated during AM rounds over the weekend.  -Continued significant agitation, discussed in multidisciplinary critical care rounds today, and IV phenytoin every 6 when necessary  PULMONARY Acute respiratory failure, aspiration pneumonia. Metabolic acidosis Drug overdose/polysubstance abuse P:  -Patients remains extubated. -Maintain O2 saturations greater than 88% -Escherichia coli in sputum, likely due to aspiration, continue antibiotics.  CARDIOVASCULAR - had some runs of vtach during the night, currently on significant antipsychiotics, potassium low but being replaced -continue with hemodynamic monitoring  RENAL Metabolic Acidosis Hypokalemia-resolved Appears improved.cont with IVF, thiamine, Folic Acid and MVI K replaced  HEMATOLOGIC -follow cbc  INFECTIOUS - currently on unasyn for aspiration pneumonia.   Unasyn>> 1/27>> expected end date is 2/10.   Sputum culture 1/31: Escherichia coli MRSA screen 1/27: Negative. Blood culture 1/27: Negative  ENDOCRINE -ICU hypo/hyperglycemic protocol    ---------------------------------------   ----------------------------------------   Name: Duane Hayes MRN: [id number] DOB: 15-Feb-1961    ADMISSION DATE:  01/21/2016  CHIEF COMPLAINT: Dyspnea.   SUBJECTIVE:    Patient with continued early morning agitation, sundowning, requiring when necessary Ativan. At the time of my evaluation patient is moderately sedated, unresponsive to sternal rub, reexamination 10 minutes later patient is moderately more arousable after Precedex turned down to 1.2   VITAL SIGNS: Temp:  [97.2 F (36.2 C)-98.9 F (37.2 C)] 97.9 F (36.6 C) (02/05 0700) Pulse Rate:  [35-71] 62 (02/05 0900) Resp:  [13-32] 14 (02/05 0900) BP: (103-154)/(66-93) 137/93 mmHg (02/05 0900) SpO2:  [91 %-100 %] 97 % (02/05 0900) Weight:  [133 lb 6.1 oz (60.5 kg)] 133 lb 6.1 oz (60.5 kg) (02/05 0500) HEMODYNAMICS:   VENTILATOR SETTINGS:   INTAKE / OUTPUT:  Intake/Output Summary (Last 24 hours) at 01/30/16 1217 Last data filed at 01/30/16 0800  Gross per 24 hour  Intake 2154.99 ml  Output   1475 ml  Net 679.99 ml    PHYSICAL EXAMINATION: Physical Examination:   VS: BP 137/93 mmHg  Pulse 62  Temp(Src) 97.9 F (36.6 C) (Oral)  Resp 14  Ht  (1.727 m)  Wt 133 lb 6.1 oz (60.5 kg)  BMI 20.28 kg/m2  SpO2 97%  General Appearance: No distress  Neuro:without focal findings, mental reduced  HEENT: PERRLA, EOM intact. Pulmonary: normal breath sounds , significant snoring  CardiovascularNormal S1,S2.  No m/r/g.   Abdomen: Benign, Soft, non-tender. Renal:  No costovertebral tenderness  GU:  Not performed at this time. Endocrine: No evident thyromegaly. Skin:   warm, no rashes, no ecchymosis  Extremities: normal, no cyanosis, clubbing.   LABS:   LABORATORY PANEL:   CBC  Recent Labs Lab 01/29/16 0422  WBC 9.8  HGB 10.6*  HCT 31.0*  PLT 275    Chemistries   Recent Labs Lab 01/30/16 0522  NA 140  K 3.6  CL 106  CO2 22  GLUCOSE 106*  BUN 14  CREATININE 0.67  CALCIUM 8.5*  MG 2.0  PHOS 3.5     Recent Labs Lab 01/28/16 0809 01/28/16 1207 01/28/16 2029 01/29/16 0904 01/29/16 2120 01/30/16 0830  GLUCAP 106* 125* 106* 108* 83 106*    Recent Labs Lab  01/27/16 0409  PHART 7.52*  PCO2ART 38  PO2ART 50*   No results for input(s): AST, ALT, ALKPHOS, BILITOT, ALBUMIN in the last 168 hours.  Cardiac Enzymes No results for input(s): TROPONINI in the last 168 hours.  RADIOLOGY:  No results found.     I have personally obtained a history, examined the patient, evaluated laboratory and imaging results, formulated the assessment and plan and placed orders. CRITICAL CARE: The patient is critically ill with multiple organ systems failure and requires high complexity decision making for assessment and support, frequent evaluation and titration of therapies, application of advanced monitoring technologies and extensive interpretation of multiple databases. Critical Care Time devoted to patient care services described in this note is 45 minutes.    Stephanie Acre, MD [facility] Pager [phone] (please enter 7-digits) On Call Pager - [phone] (please enter 7-digits)

## 2016-01-30 NOTE — Progress Notes (Signed)
MEDICATION ELECTROLYTE CONSULT - FOLLOW UP  Pharmacy Consult for electrolyte management Indication: hypokalemia  No Known Allergies  Vital Signs: Temp: 98.4 F (36.9 C) (02/05 0343) Temp Source: Oral (02/05 0343) BP: 130/74 mmHg (02/05 0600) Pulse Rate: 35 (02/05 0600) Intake/Output from previous day: 02/04 0701 - 02/05 0700 In: 2422.4 [I.V.:1822.4; IV Piggyback:600] Out: 1325 [Urine:1325] Intake/Output from this shift: Total I/O In: 1302 [I.V.:1102; IV Piggyback:200] Out: 725 [Urine:725]  Labs:  Recent Labs  01/28/16 0453 01/29/16 0422 01/29/16 1505 01/30/16 0522  WBC 9.7 9.8  --   --   HGB 11.0* 10.6*  --   --   HCT 33.2* 31.0*  --   --   PLT 277 275  --   --   CREATININE 0.70 0.48* 0.66 0.67  MG 2.1  --  2.1 2.0  PHOS 4.0  --  4.2 3.5   Estimated Creatinine Clearance: 90.3 mL/min (by C-G formula based on Cr of 0.67).   Assessment: K/Mg/Phos within normal limits this afternoon after K was replaced this morning.  K of 4.2 WNL Mg of 2.1 WNL Phos 4.2 WNL  Goal of Therapy:  Electrolytes within normal limits  Plan:  Electrolytes within normal limits, no need for replacement orders.  Will check K/Mg/Phos with AM labs tomorrow.  Carola Frost, Pharm.D., BCPS Clinical Pharmacist 01/30/2016,6:23 AM

## 2016-01-30 NOTE — Consult Note (Signed)
CARDIOLOGY CONSULT NOTE     Primary Care Physician: No PCP Per Patient Referring Physician:  Admit Date: 01/21/2016  Reason for consultation:  Duane Hayes is a 55 y.o. male with a presented to the hospital on 1/27 after being found unconscious in a motel room. The patient is currently sedated and therefore history is unable to be taken. The patient was found with ice all over his body. His pulse was apparently 20 bpm and his respirations were shallow. He was given Narcan and he became very agitated. During attempts to assist subcutaneous the patient he lost a pulse. CPR was initiated and he had return of circulation. The patient was intubated,and was placed on mechanical ventilation until the hospitalist service was called.he was extubated the day after admission. Throughout his hospital stay he was very agitated and combative.he is being sedated with Precedex. He is also receiving Ativan and Haldol. Overnight on 2/4, the patient had runs of ventricular ectopy. He remained sedated on Precedex, Ativan, and Haldol.     Today, he denies symptoms of palpitations, chest pain, shortness of breath, orthopnea, PND, lower extremity edema, dizziness, presyncope, syncope, or neurologic sequela. The patient is tolerating medications without difficulties and is otherwise without complaint today.   Past Medical History  Diagnosis Date  . Arthritis    Past Surgical History  Procedure Laterality Date  . Shoulder surgery      . ampicillin-sulbactam (UNASYN) IV  3 g Intravenous Q6H  . antiseptic oral rinse  7 mL Mouth Rinse q12n4p  . chlorhexidine  15 mL Mouth Rinse BID  . diazepam  5 mg Intravenous 6 times per day  . folic acid  1 mg Oral Daily  . heparin  5,000 Units Subcutaneous 3 times per day  . pantoprazole (PROTONIX) IV  40 mg Intravenous Q24H  . polyethylene glycol  17 g Oral BID  . senna-docusate  2 tablet Oral BID  . sodium chloride flush  3 mL Intravenous Q12H  . thiamine  100 mg  Oral Daily   . sodium chloride 75 mL/hr at 01/30/16 0448  . dexmedetomidine 1.1 mcg/kg/hr (01/30/16 7425)    No Known Allergies  Social History   Social History  . Marital Status: Widowed    Spouse Name: N/A  . Number of Children: N/A  . Years of Education: N/A   Occupational History  . Not on file.   Social History Main Topics  . Smoking status: Current Every Day Smoker    Types: Cigarettes  . Smokeless tobacco: Not on file  . Alcohol Use: Yes  . Drug Use: Yes  . Sexual Activity: Not on file   Other Topics Concern  . Not on file   Social History Narrative    History reviewed. No pertinent family history.  ROS- All systems are reviewed and negative except as per the HPI above  Physical Exam: Telemetry: Filed Vitals:   01/30/16 0343 01/30/16 0400 01/30/16 0500 01/30/16 0600  BP:  153/69 154/81 130/74  Pulse:  56 52 35  Temp: 98.4 F (36.9 C)     TempSrc: Oral     Resp:  Height:      Weight:   133 lb 6.1 oz (60.5 kg)   SpO2:  98% 96% 99%    GEN-  ill-appearing sedated  Head- normocephalic, atraumatic Eyes-  Sclera clear, conjunctiva pink Ears- hearing intact Oropharynx- clear Neck- supple, no JVP Lymph- no cervical lymphadenopathy Lungs- Clear to ausculation bilaterally, normal work  of breathing Heart- Regular rate and rhythm, no murmurs, rubs or gallops, PMI not laterally displaced GI- soft, NT, ND, + BS Extremities- no clubbing, cyanosis, or edema MS- no significant deformity or atrophy Skin- no rash or lesion Psych-  Sedated Neuro: sedated and snoring Labs:   Lab Results  Component Value Date   WBC 9.8 01/29/2016   HGB 10.6* 01/29/2016   HCT 31.0* 01/29/2016   MCV 92.4 01/29/2016   PLT 275 01/29/2016    Recent Labs Lab 01/30/16 0522  NA 140  K 3.6  CL 106  CO2 22  BUN 14  CREATININE 0.67  CALCIUM 8.5*  GLUCOSE 106*   Lab Results  Component Value Date   TROPONINI <0.03 01/21/2016   No results found for: CHOL No  results found for: HDL No results found for: [facility] Lab Results  Component Value Date   TRIG 60 01/26/2016   TRIG 106 01/25/2016   TRIG 127 01/23/2016   No results found for: CHOLHDL No results found for: LDLDIRECT    ECG: Sinus rhythm, QTc 408   Radiology: Low lung volumes and mild bibasilar atelectasis and possible infiltrates slight interim clearing   Echo: pending   ASSESSMENT AND PLAN:   1. Cardiac arrest: Reported to be bradycardic arrest also with loss of pulse. This is likely due to the constellation of intoxication by possible cocaine, and alcohol. Agree with getting an echocardiogram to see what the patient's EF is which may help direct treatment if he has heart failure. Would continue to monitor him for further signs of rhythm disturbances. He does have a normal EKG without conduction system disease. His QT interval was prolonged after his presentation, but while in the hospital it is shortened back to normal.   2.  Ventricular arrhythmia:  no evidence of conduction system disease on his EKG. Assuming his echocardiogram is normal, no further workup is necessary. If his QT interval does prolong significantly, would work to avoid antipsychotic medications.   Kiersten Coss Jorja Loa, MD 01/30/2016  7:07 AM

## 2016-01-31 ENCOUNTER — Inpatient Hospital Stay: Payer: Self-pay

## 2016-01-31 LAB — MAGNESIUM
MAGNESIUM: 2.3 mg/dL (ref 1.7–2.4)
Magnesium: 1.6 mg/dL — ABNORMAL LOW (ref 1.7–2.4)

## 2016-01-31 LAB — BASIC METABOLIC PANEL
Anion gap: 8 (ref 5–15)
Anion gap: 7 (ref 5–15)
BUN: 10 mg/dL (ref 6–20)
BUN: 8 mg/dL (ref 6–20)
CALCIUM: 7.4 mg/dL — AB (ref 8.9–10.3)
CO2: 20 mmol/L — AB (ref 22–32)
CO2: 22 mmol/L (ref 22–32)
CREATININE: 0.5 mg/dL — AB (ref 0.61–1.24)
CREATININE: 0.61 mg/dL (ref 0.61–1.24)
Calcium: 8.2 mg/dL — ABNORMAL LOW (ref 8.9–10.3)
Chloride: 110 mmol/L (ref 101–111)
Chloride: 107 mmol/L (ref 101–111)
GFR calc Af Amer: >60 mL/min (ref >60)
GLUCOSE: 113 mg/dL — AB (ref 65–99)
GLUCOSE: 123 mg/dL — AB (ref 65–99)
POTASSIUM: 3.4 mmol/L — AB (ref 3.5–5.1)
Potassium: 3 mmol/L — ABNORMAL LOW (ref 3.5–5.1)
SODIUM: 136 mmol/L (ref 135–145)
Sodium: 138 mmol/L (ref 135–145)

## 2016-01-31 LAB — GLUCOSE, CAPILLARY: Glucose-Capillary: 114 mg/dL — ABNORMAL HIGH (ref 65–99)

## 2016-01-31 LAB — PHOSPHORUS: PHOSPHORUS: 2.3 mg/dL — AB (ref 2.5–4.6)

## 2016-01-31 MED ORDER — MAGNESIUM SULFATE 2 GM/50ML IV SOLN
2.0000 g | Freq: Once | INTRAVENOUS | Status: AC
  Administered 2016-01-31: 2 g via INTRAVENOUS
  Filled 2016-01-31: qty 50

## 2016-01-31 MED ORDER — POTASSIUM PHOSPHATES 15 MMOLE/5ML IV SOLN
10.0000 mmol | Freq: Once | INTRAVENOUS | Status: AC
  Administered 2016-01-31: 10 mmol via INTRAVENOUS
  Filled 2016-01-31: qty 3.33

## 2016-01-31 MED ORDER — POTASSIUM CHLORIDE 10 MEQ/100ML IV SOLN
10.0000 meq | INTRAVENOUS | Status: AC
  Administered 2016-01-31 (×6): 10 meq via INTRAVENOUS
  Filled 2016-01-31 (×6): qty 100

## 2016-01-31 MED ORDER — ENSURE ENLIVE PO LIQD
237.0000 mL | Freq: Two times a day (BID) | ORAL | Status: DC
  Administered 2016-01-31 – 2016-02-07 (×3): 237 mL via ORAL

## 2016-01-31 MED ORDER — MAGNESIUM SULFATE 50 % IJ SOLN: 3.0000 g | Freq: Once | INTRAVENOUS | Status: DC

## 2016-01-31 MED ORDER — DEXMEDETOMIDINE HCL IN NACL 400 MCG/100ML IV SOLN
0.0000 ug/kg/h | INTRAVENOUS | Status: DC
  Administered 2016-02-01: 0.3 ug/kg/h via INTRAVENOUS
  Administered 2016-02-01: 0.05 ug/kg/h via INTRAVENOUS
  Filled 2016-01-31 (×3): qty 100

## 2016-01-31 MED ORDER — KCL-LACTATED RINGERS-D5W 20 MEQ/L IV SOLN
INTRAVENOUS | Status: DC
  Administered 2016-01-31 – 2016-02-01 (×3): via INTRAVENOUS
  Filled 2016-01-31 (×5): qty 1000

## 2016-01-31 MED ORDER — HALOPERIDOL LACTATE 5 MG/ML IJ SOLN
2.0000 mg | INTRAMUSCULAR | Status: DC | PRN
  Administered 2016-01-31 – 2016-02-07 (×16): 2 mg via INTRAVENOUS
  Filled 2016-01-31 (×16): qty 1

## 2016-01-31 MED ORDER — MAGNESIUM SULFATE IN D5W 10-5 MG/ML-% IV SOLN
1.0000 g | Freq: Once | INTRAVENOUS | Status: AC
  Administered 2016-01-31: 1 g via INTRAVENOUS
  Filled 2016-01-31: qty 100

## 2016-01-31 MED ORDER — POLYETHYLENE GLYCOL 3350 17 G PO PACK: 17.0000 g | PACK | Freq: Every day | ORAL | Status: DC | PRN

## 2016-01-31 MED ORDER — DOCUSATE SODIUM 100 MG PO CAPS
100.0000 mg | ORAL_CAPSULE | Freq: Two times a day (BID) | ORAL | Status: DC
  Administered 2016-01-31 – 2016-02-07 (×13): 100 mg via ORAL
  Filled 2016-01-31 (×13): qty 1

## 2016-01-31 NOTE — Evaluation (Signed)
Physical Therapy Evaluation Patient Details Name: Duane Hayes MRN: [id number] DOB: [birth year] Today's Date: 01/31/2016   History of Present Illness  Patient is a 55 y/o male that presents after apparent suicidal attempt with cocktail of illicit drugs. He coded on the way to [facility] and was resucitated with CPR. Patient was intubated for several days and has been going through DTs now that he is more alert.   Clinical Impression  Patient reports he is fairly inactive at baseline, however he almost certainly ambulates longer distances with less support than in this session. He reports L knee pain, which he attributes to weakness in this session causing multiple episodes of near buckling. He demonstrates no discernable difference in MMT for knee extensors, though certainly both functionally weak (L>R). Hemodynamics remained stable throughout ambulation, though patient noted to fatigue quickly due to the significant effort required to advance his LEs. Patient is able to transfer supine <--> sit and sit <--> stand with relatively little if any physical assistance (just prolonged time). Given the above, patient would benefit from short term rehabilitation on discharge to increase his safety and independence with mobility.     Follow Up Recommendations SNF    Equipment Recommendations  Rolling walker with 5" wheels    Recommendations for Other Services       Precautions / Restrictions Precautions Precautions: Fall Restrictions Weight Bearing Restrictions: No      Mobility  Bed Mobility Overal bed mobility: Needs Assistance Bed Mobility: Supine to Sit;Sit to Supine     Supine to sit: Supervision Sit to supine: Supervision   General bed mobility comments: Patient requires prolonged time to complete, no loss of balance or cuing required.   Transfers Overall transfer level: Needs assistance Equipment used: Rolling walker (2 wheeled) Transfers: Sit to/from Stand Sit to Stand: Min  guard         General transfer comment: Patient uses heavy assist from UEs to complete transfer slowly, bilateral LE weakness noted with prolonged time to complete transfer.   Ambulation/Gait Ambulation/Gait assistance: Min guard Ambulation Distance (Feet): 10 Feet Assistive device: Rolling walker (2 wheeled) Gait Pattern/deviations: Antalgic;Decreased stance time - left;Decreased stride length;Trunk flexed   Gait velocity interpretation: Below normal speed for age/gender General Gait Details: Patient reports he feels pain in his L knee which is not typically present. He reports it feels like a weakness and that it is going to buckle on him. Minimal step lengths with cuing for technique of use of RW.  Stairs            Wheelchair Mobility    Modified Rankin (Stroke Patients Only)       Balance Overall balance assessment: Needs assistance Sitting-balance support: Bilateral upper extremity supported Sitting balance-Leahy Scale: Fair Sitting balance - Comments: Patient leans posteriorly at first, then corrects with cuing, he displays good trunk control, though he leans laterally and sagitally at times it appears to get comfortable.    Standing balance support: Bilateral upper extremity supported Standing balance-Leahy Scale: Poor Standing balance comment: Initially patient demonstrates bilateral knee flexion until cued otherwise. He ambulates with mild knee flexion bilaterally and takes minimal step lengths with several episodes of mini buckling.                              Pertinent Vitals/Pain Pain Assessment: Faces Faces Pain Scale: Hurts little more Pain Location: L knee  Pain Descriptors / Indicators: Aching (" Weakness") Pain  Intervention(s): Limited activity within patient's tolerance;Monitored during session (Informed RN)    Home Living Family/patient expects to be discharged to:: Private residence                      Prior Function Level  of Independence: Independent         Comments: Patient was not a terribly reliable historian and did not provide adequate information on past mobility history.      Hand Dominance        Extremity/Trunk Assessment   Upper Extremity Assessment: Overall WFL for tasks assessed           Lower Extremity Assessment: Overall WFL for tasks assessed         Communication   Communication: No difficulties  Cognition Arousal/Alertness: Awake/alert Behavior During Therapy: WFL for tasks assessed/performed Overall Cognitive Status: Within Functional Limits for tasks assessed                      General Comments      Exercises        Assessment/Plan    PT Assessment Patient needs continued PT services  PT Diagnosis Difficulty walking;Generalized weakness   PT Problem List Decreased strength;Decreased knowledge of use of DME;Decreased safety awareness;Decreased activity tolerance;Decreased balance;Decreased mobility;Cardiopulmonary status limiting activity  PT Treatment Interventions DME instruction;Gait training;Stair training;Therapeutic activities;Therapeutic exercise;Balance training   PT Goals (Current goals can be found in the Care Plan section) Acute Rehab PT Goals Patient Stated Goal: To return home when possible PT Goal Formulation: With patient Time For Goal Achievement: 02/14/16 Potential to Achieve Goals: Good    Frequency Min 2X/week   Barriers to discharge Decreased caregiver support      Co-evaluation               End of Session Equipment Utilized During Treatment: Gait belt Activity Tolerance: Patient tolerated treatment well;Patient limited by fatigue Patient left: in bed;with call bell/phone within reach;with nursing/sitter in room Nurse Communication: Mobility status (Pain in L knee)         Time: 1335-1350 PT Time Calculation (min) (ACUTE ONLY): 15 min   Charges:   PT Evaluation $PT Eval Moderate Complexity: 1  Procedure     PT G Codes:       Kerin Ransom, PT, DPT    01/31/2016, 5:39 PM

## 2016-01-31 NOTE — Progress Notes (Signed)
PHARMACY - CRITICAL CARE PROGRESS NOTE  Pharmacy Consult for Unasyn (10/14), Constipation Prevention, Electrolyte Replacement and Glucose Management    No Known Allergies  Patient Measurements: Height:  (172.7 cm) Weight: 133 lb 6.1 oz (60.5 kg) IBW/kg (Calculated) : 68.4   Vital Signs: Temp: 97.6 F (36.4 C) (02/06 0000) Temp Source: Axillary (02/06 0000) BP: 168/92 mmHg (02/06 0200) Pulse Rate: 82 (02/06 0200) Intake/Output from previous day: 02/05 0701 - 02/06 0700 In: 2411.5 [P.O.:360; I.V.:1751.5; IV Piggyback:300] Out: 2400 [Urine:2400] Intake/Output from this shift: Total I/O In: 727.4 [I.V.:627.4; IV Piggyback:100] Out: 850 [Urine:850] Vent settings for last 24 hours:    Labs:  Recent Labs  01/28/16 0453 01/29/16 0422 01/29/16 1505 01/30/16 0522 01/31/16 0155  WBC 9.7 9.8  --   --   --   HGB 11.0* 10.6*  --   --   --   HCT 33.2* 31.0*  --   --   --   PLT 277 275  --   --   --   CREATININE 0.70 0.48* 0.66 0.67 0.50*  MG 2.1  --  2.1 2.0 1.6*  PHOS 4.0  --  4.2 3.5 2.3*   Estimated Creatinine Clearance: 90.3 mL/min (by C-G formula based on Cr of 0.5).   Recent Labs  01/29/16 2120 01/30/16 0830 01/30/16 2232  GLUCAP 83 106* 102*    Microbiology: Recent Results (from the past 720 hour(s))  Blood culture (routine x 2)     Status: None   Collection Time: 01/21/16  4:20 AM  Result Value Ref Range Status   Specimen Description BLOOD RIGHT ANTECUBITAL  Final   Special Requests BOTTLES DRAWN AEROBIC AND ANAEROBIC  Final   Culture NO GROWTH 6 DAYS  Final   Report Status 01/27/2016 FINAL  Final  Blood culture (routine x 2)     Status: None   Collection Time: 01/21/16  4:20 AM  Result Value Ref Range Status   Specimen Description BLOOD LEFT WRIST  Final   Special Requests BOTTLES DRAWN AEROBIC AND ANAEROBIC  Final   Culture NO GROWTH 6 DAYS  Final   Report Status 01/27/2016 FINAL  Final  MRSA PCR Screening     Status: None   Collection Time: 01/21/16  8:51 AM  Result Value Ref Range Status   MRSA by PCR NEGATIVE NEGATIVE Final    Comment:        The GeneXpert MRSA Assay (FDA approved for NASAL specimens only), is one component of a comprehensive MRSA colonization surveillance program. It is not intended to diagnose MRSA infection nor to guide or monitor treatment for MRSA infections.   Urine culture     Status: None   Collection Time: 01/23/16  6:09 PM  Result Value Ref Range Status   Specimen Description URINE, CATHETERIZED  Final   Special Requests Normal  Final   Culture NO GROWTH 1 DAY  Final   Report Status 01/25/2016 FINAL  Final  Culture, expectorated sputum-assessment     Status: None   Collection Time: 01/25/16  2:57 PM  Result Value Ref Range Status   Specimen Description SPUTUM  Final   Special Requests NONE  Final   Sputum evaluation THIS SPECIMEN IS ACCEPTABLE FOR SPUTUM CULTURE  Final   Report Status 01/25/2016 FINAL  Final  Culture, respiratory (NON-Expectorated)     Status: None   Collection Time: 01/25/16  2:57 PM  Result Value Ref Range Status   Specimen Description SPUTUM  Final  Special Requests NONE Reflexed from [id number]  Final   Gram Stain   Final    MODERATE WBC SEEN RARE SQUAMOUS EPITHELIAL CELLS PRESENT RARE GRAM POSITIVE COCCI FEW GRAM NEGATIVE RODS    Culture MODERATE GROWTH ESCHERICHIA COLI  Final   Report Status 01/27/2016 FINAL  Final   Organism ID, Bacteria ESCHERICHIA COLI  Final      Susceptibility   Escherichia coli - MIC*    AMPICILLIN Value in next row Resistant      RESISTANT>=32    CEFAZOLIN Value in next row Sensitive      SENSITIVE<=4    CEFEPIME Value in next row Sensitive      SENSITIVE<=1    CEFTAZIDIME Value in next row Sensitive      SENSITIVE<=1    CEFTRIAXONE Value in next row Sensitive      SENSITIVE<=1    CIPROFLOXACIN Value in next row Sensitive      SENSITIVE<=0.25    GENTAMICIN Value in next row Sensitive      SENSITIVE<=1     IMIPENEM Value in next row Sensitive      SENSITIVE<=0.25    TRIMETH/SULFA Value in next row Resistant      RESISTANT>=320    AMPICILLIN/SULBACTAM Value in next row Sensitive      SENSITIVE4    PIP/TAZO Value in next row Sensitive      SENSITIVE<=4    Extended ESBL Value in next row Sensitive      SENSITIVE<=4    * MODERATE GROWTH ESCHERICHIA COLI    Medications:  Scheduled:  . ampicillin-sulbactam (UNASYN) IV  3 g Intravenous Q6H  . antiseptic oral rinse  7 mL Mouth Rinse q12n4p  . chlorhexidine  15 mL Mouth Rinse BID  . diazepam  5 mg Intravenous 6 times per day  . folic acid  1 mg Oral Daily  . heparin  5,000 Units Subcutaneous 3 times per day  . magnesium sulfate 1 - 4 g bolus IVPB  2 g Intravenous Once   Followed by  . magnesium sulfate 1 - 4 g bolus IVPB  1 g Intravenous Once  . pantoprazole (PROTONIX) IV  40 mg Intravenous Q24H  . polyethylene glycol  17 g Oral BID  . potassium chloride  10 mEq Intravenous Q1 Hr x 6  . senna-docusate  2 tablet Oral BID  . sodium chloride flush  3 mL Intravenous Q12H  . thiamine  100 mg Oral Daily   Infusions:  . sodium chloride 75 mL/hr at 01/30/16 2214  . dexmedetomidine 0.8 mcg/kg/hr (01/30/16 2032)    Assessment: Pharmacy consulted for medication management for 55 yo male ICU patient previously requiring mechanical ventilation now extubated.  Plan:  K 3, Mg 1.6, Phos 2.3. ELink prescriber entered potassium chloride 10 mEq IV Q1H x 6 doses and magnesium sulfate 3 gm IV x 1 (2 gm + 1 gm premix). Will recheck BMP and magnesium this afternoon, and all electrolytes tomorrow with AM labs.   Carola Frost, Pharm.D., BCPS Clinical Pharmacist 01/31/2016,2:46 AM

## 2016-01-31 NOTE — Progress Notes (Signed)
eLink Physician-Brief Progress Note Patient Name: Duane Hayes DOB: 1961/05/25 MRN: [id number]   Date of Service  01/31/2016  HPI/Events of Note  EKG reviewed & sinus rhythm. K 3.0 & Magnesium 1.6. Normal creatinine/renal function. Has PICC in place.  eICU Interventions  1. Magnesium Sulfate 3gm IV 2. KCl IV x6 runs     Intervention Category Major Interventions: Arrhythmia - evaluation and management;Electrolyte abnormality - evaluation and management  Lawanda Cousins 01/31/2016, 2:40 AM

## 2016-01-31 NOTE — Care Management (Signed)
Off precedex and on room air today

## 2016-01-31 NOTE — Progress Notes (Signed)
Pt off Precedex gtt since 1000. Pt is now alert and oriented. Diet increased today to regular diet. Pt tolerated well and consumed about 60% of late lunch tray. Pt up with 2 assist to toilet. Pt very weak and unsteady gait.

## 2016-01-31 NOTE — Progress Notes (Signed)
Nutrition Follow-up     INTERVENTION:   Medical Food Supplement Therapy: add Ensure Enlive po BID, each supplement provides 350 kcal and 20 grams of protein Meals and Snacks: Cater to patient preferences  NUTRITION DIAGNOSIS:   Inadequate oral intake related to acute illness as evidenced by NPO status. Being addressed as diet advanced post extubation, supplement added  GOAL:   Provide needs based on ASPEN/SCCM guidelines  MONITOR:    (Energy Intake, Anthropometrics, Electrolyte/Renal Profile, Digestive System, Pulmonary)  REASON FOR ASSESSMENT:   Consult Enteral/tube feeding initiation and management  ASSESSMENT:    Periods of confusion, 1:1 sitter, titrating precedex  Diet Order:  Diet regular Room service appropriate?: Yes; Fluid consistency:: Thin   Energy Intake: tolerated CL diet this AM, NPO since extubation until yesterday. Pt requesting hamburger per Brittney RN and diet was advanced to Regular  Electrolyte and Renal Profile:  Recent Labs Lab 01/29/16 1505 01/30/16 0522 01/31/16 0155  BUN CREATININE 0.66 0.67 0.50*  NA 139 140 138  K 4.2 3.6 3.0*  MG 2.1 2.0 1.6*  PHOS 4.2 3.5 2.3*   Glucose Profile:  Recent Labs  01/30/16 0830 01/30/16 2232 01/31/16 0839  GLUCAP 106* 102* 114*   Meds: D5-LR at 75 ml/hr  Height:   Ht Readings from Last 1 Encounters:  01/21/16  (1.727 m)    Weight:   Wt Readings from Last 1 Encounters:  01/31/16 135 lb 5.8 oz (61.4 kg)    BMI:  Body mass index is 20.59 kg/(m^2).  Estimated Nutritional Needs:   Kcal:  1895 kcals (Ve: 8, Tmax: 38.4) using wt of 69.7 kg  Protein:  83-138 g (1.2-2.0 g/kg)   Fluid:  1725-2070 ml (25-30 ml/kg)      HIGH Care Level  Romelle Starcher MS, RD, LDN [phone] Pager  [phone] Weekend/On-Call Pager

## 2016-01-31 NOTE — Progress Notes (Signed)
To Whomsoever it may concern:   Mr. Duane Hayes has been admitted to [facility] on 01/21/2016 for a critical medical condition. He is still getting treatment and his condition is still deemed to be critical as of 01/31/2016. Because of his hospital stay and his critical medical condition, patient will not be able to attend any outside activities. Please excuse him for this.  Please contact if any questions or concerns.   Enid Baas, M.D. [facility] [address] [place], [place] [address] Ph: [phone]

## 2016-01-31 NOTE — Progress Notes (Addendum)
PHARMACY - CRITICAL CARE PROGRESS NOTE  Pharmacy Consult for Unasyn (10/14), Constipation Prevention, Electrolyte Replacement and Glucose Management    No Known Allergies  Patient Measurements: Height:  (172.7 cm) Weight: 135 lb 5.8 oz (61.4 kg) IBW/kg (Calculated) : 68.4   Vital Signs: Temp: 97.6 F (36.4 C) (02/06 0800) Temp Source: Axillary (02/06 0800) BP: 109/65 mmHg (02/06 0800) Pulse Rate: 50 (02/06 0800) Intake/Output from previous day: 02/05 0701 - 02/06 0700 In: 3407.5 [P.O.:360; I.V.:2197.5; IV Piggyback:850] Out: 2975 [Urine:2975] Intake/Output from this shift: Total I/O In: 289.2 [I.V.:89.2; IV Piggyback:200] Out: 325 [Urine:325] Vent settings for last 24 hours:    Labs:  Recent Labs  01/29/16 0422 01/29/16 1505 01/30/16 0522 01/31/16 0155  WBC 9.8  --   --   --   HGB 10.6*  --   --   --   HCT 31.0*  --   --   --   PLT 275  --   --   --   CREATININE 0.48* 0.66 0.67 0.50*  MG  --  2.1 2.0 1.6*  PHOS  --  4.2 3.5 2.3*   Estimated Creatinine Clearance: 91.7 mL/min (by C-G formula based on Cr of 0.5).   Recent Labs  01/30/16 0830 01/30/16 2232 01/31/16 0839  GLUCAP 106* 102* 114*    Microbiology: Recent Results (from the past 720 hour(s))  Blood culture (routine x 2)     Status: None   Collection Time: 01/21/16  4:20 AM  Result Value Ref Range Status   Specimen Description BLOOD RIGHT ANTECUBITAL  Final   Special Requests BOTTLES DRAWN AEROBIC AND ANAEROBIC  Final   Culture NO GROWTH 6 DAYS  Final   Report Status 01/27/2016 FINAL  Final  Blood culture (routine x 2)     Status: None   Collection Time: 01/21/16  4:20 AM  Result Value Ref Range Status   Specimen Description BLOOD LEFT WRIST  Final   Special Requests BOTTLES DRAWN AEROBIC AND ANAEROBIC  Final   Culture NO GROWTH 6 DAYS  Final   Report Status 01/27/2016 FINAL  Final  MRSA PCR Screening     Status: None   Collection Time: 01/21/16  8:51 AM  Result Value Ref  Range Status   MRSA by PCR NEGATIVE NEGATIVE Final    Comment:        The GeneXpert MRSA Assay (FDA approved for NASAL specimens only), is one component of a comprehensive MRSA colonization surveillance program. It is not intended to diagnose MRSA infection nor to guide or monitor treatment for MRSA infections.   Urine culture     Status: None   Collection Time: 01/23/16  6:09 PM  Result Value Ref Range Status   Specimen Description URINE, CATHETERIZED  Final   Special Requests Normal  Final   Culture NO GROWTH 1 DAY  Final   Report Status 01/25/2016 FINAL  Final  Culture, expectorated sputum-assessment     Status: None   Collection Time: 01/25/16  2:57 PM  Result Value Ref Range Status   Specimen Description SPUTUM  Final   Special Requests NONE  Final   Sputum evaluation THIS SPECIMEN IS ACCEPTABLE FOR SPUTUM CULTURE  Final   Report Status 01/25/2016 FINAL  Final  Culture, respiratory (NON-Expectorated)     Status: None   Collection Time: 01/25/16  2:57 PM  Result Value Ref Range Status   Specimen Description SPUTUM  Final   Special Requests NONE Reflexed from [id number]  Final   Gram Stain   Final    MODERATE WBC SEEN RARE SQUAMOUS EPITHELIAL CELLS PRESENT RARE GRAM POSITIVE COCCI FEW GRAM NEGATIVE RODS    Culture MODERATE GROWTH ESCHERICHIA COLI  Final   Report Status 01/27/2016 FINAL  Final   Organism ID, Bacteria ESCHERICHIA COLI  Final      Susceptibility   Escherichia coli - MIC*    AMPICILLIN Value in next row Resistant      RESISTANT>=32    CEFAZOLIN Value in next row Sensitive      SENSITIVE<=4    CEFEPIME Value in next row Sensitive      SENSITIVE<=1    CEFTAZIDIME Value in next row Sensitive      SENSITIVE<=1    CEFTRIAXONE Value in next row Sensitive      SENSITIVE<=1    CIPROFLOXACIN Value in next row Sensitive      SENSITIVE<=0.25    GENTAMICIN Value in next row Sensitive      SENSITIVE<=1    IMIPENEM Value in next row Sensitive       SENSITIVE<=0.25    TRIMETH/SULFA Value in next row Resistant      RESISTANT>=320    AMPICILLIN/SULBACTAM Value in next row Sensitive      SENSITIVE4    PIP/TAZO Value in next row Sensitive      SENSITIVE<=4    Extended ESBL Value in next row Sensitive      SENSITIVE<=4    * MODERATE GROWTH ESCHERICHIA COLI    Medications:  Scheduled:  . ampicillin-sulbactam (UNASYN) IV  3 g Intravenous Q6H  . antiseptic oral rinse  7 mL Mouth Rinse q12n4p  . chlorhexidine  15 mL Mouth Rinse BID  . diazepam  5 mg Intravenous 6 times per day  . folic acid  1 mg Oral Daily  . heparin  5,000 Units Subcutaneous 3 times per day  . pantoprazole (PROTONIX) IV  40 mg Intravenous Q24H  . polyethylene glycol  17 g Oral BID  . potassium phosphate IVPB (mmol)  10 mmol Intravenous Once  . senna-docusate  2 tablet Oral BID  . sodium chloride flush  3 mL Intravenous Q12H  . thiamine  100 mg Oral Daily   Infusions:  . sodium chloride 75 mL/hr at 01/31/16 0700  . dexmedetomidine Stopped (01/31/16 1610)    Assessment: Pharmacy consulted for medication management for 55 yo male ICU patient previously requiring mechanical ventilation now extubated.  Plan:  1. Continue Unasyn 3 g iv q 6 hours. Patient is currently on day 10 of 14 days of therpay.   2. Patient with BM 2/5. Will continue senna/docusate 2 tabs bid and change Miralax to PRN.  Patient with multiple BM per RN, so will change senna/docusate to docusate bid.   3. K=3.0, Mag= 1.6, Phos= 2.3. Electrolytes replaced this am with KCl 10 meq x 6, mag sulfate 3 g iv, and potassium phosphate . Will recheck potassium and magnesium at 1200.   4. Blood sugar WNL. Patient is off SSI and is not on steroids.   Luisa Hart D Clinical Pharmacist 01/31/2016,11:15 AM

## 2016-01-31 NOTE — Progress Notes (Signed)
[facility] - [place] [facility]   PATIENT NAME: Duane Hayes    MR#:  [id number]  DATE OF BIRTH:  1961/01/21  SUBJECTIVE:  CHIEF COMPLAINT:   Chief Complaint  Patient presents with  . Drug Overdose   - She remains confused, however more conversational with this later this morning. Remains on Precedex drip at a lower dose. Currently sleeping because he just received his scheduled Valium.  REVIEW OF SYSTEMS:  Review of Systems  Unable to perform ROS: critical illness    DRUG ALLERGIES:  No Known Allergies  VITALS:  Blood pressure 138/98, pulse 31, temperature 97.6 F (36.4 C), temperature source Axillary, resp. rate 24, height  (1.727 m), weight 61.4 kg (135 lb 5.8 oz), SpO2 96 %.  PHYSICAL EXAMINATION:  Physical Exam  GENERAL:  55 y.o.-year-old patient lying in the bed, arousable to deep palpation but does not follow commands.  EYES: Pupils equal, round, reactive to light and accommodation. No scleral icterus. Extraocular muscles intact.  HEENT: Head atraumatic, normocephalic. Oropharynx and nasopharynx clear.  NECK:  Supple, no jugular venous distention. No thyroid enlargement, no tenderness.  LUNGS: Normal breath sounds bilaterally, no wheezing, rales,rhonchi or crepitation. No use of accessory muscles of respiration. Decreased bibasilar breath sounds. CARDIOVASCULAR: S1, S2 normal. No murmurs, rubs, or gallops.  ABDOMEN: Soft, nontender, nondistended. Bowel sounds present. No organomegaly or mass.  EXTREMITIES: No pedal edema, cyanosis, or clubbing.  NEUROLOGIC: Not cooperative for neuro exam. But able to move all 4 extremities in bed. No focal neuro deficits noted so far. PSYCHIATRIC: The patient is very confused and not oriented.  SKIN: No obvious rash, lesion, or ulcer.    LABORATORY PANEL:   CBC  Recent Labs Lab 01/29/16 0422  WBC 9.8  HGB 10.6*  HCT 31.0*  PLT 275    ------------------------------------------------------------------------------------------------------------------  Chemistries   Recent Labs Lab 01/31/16 0155  NA 138  K 3.0*  CL 110  CO2 20*  GLUCOSE 113*  BUN 10  CREATININE 0.50*  CALCIUM 7.4*  MG 1.6*   ------------------------------------------------------------------------------------------------------------------  Cardiac Enzymes No results for input(s): TROPONINI in the last 168 hours. ------------------------------------------------------------------------------------------------------------------  RADIOLOGY:  Dg Chest Port 1 View  01/31/2016  CLINICAL DATA:  Follow-up of pneumonia, respiratory failure, current smoker. EXAM: PORTABLE CHEST 1 VIEW COMPARISON:  Portable chest x-ray of January 27, 2016 FINDINGS: The lungs are adequately inflated. There is persistent interstitial density in the right lower lobe. A stable 4 mm calcified nodule is noted in the right upper lobe. The heart is top-normal in size but stable. The pulmonary vascularity is not engorged. There is no significant pleural effusion. The PICC line tip projects over the junction of the middle and distal thirds of the SVC. IMPRESSION: Improved aeration today. Persistent patchy interstitial infiltrate in the right lower lobe. When the patient can tolerate the procedure, a PA and lateral chest x-ray would be useful. Electronically Signed   By: David  [place] M.D.   On: 01/31/2016 07:33    EKG:   Orders placed or performed during the hospital encounter of 01/21/16  . ED EKG  . ED EKG  . EKG 12-Lead  . EKG 12-Lead  . EKG 12-Lead  . EKG 12-Lead  . EKG 12-Lead  . EKG 12-Lead  . EKG 12-Lead  . EKG 12-Lead    ASSESSMENT AND PLAN:   55 year old male was noted to be unconscious in a motel room. He was intubated for airway protection. Possible overdose suspected  1.acute  respiratory failure : due to Drug Overdose and alcohol intoxication; And aspiration  pneumonia - Spurum cultures with E.coli - Appreciate pulmonary critical care consultation. -Patient is extubated and currently remains on nasal cannula. -negative blood cultures so far. -Chest x-ray with improving infiltrate. -On IV Unasyn. Total 14 days treatment per Intensivist   2.cardiac arrest with hypothermia/very brief CPR; hypothermia and bradycardia are resolved.  3. Hypokalemia- replaced  4.Drug overdose-possible suicidal attempt; according to sister. continue sitter.  -Psych consult once more alert. - Urine tox when admitted was positive for amphetamines, cocaine, marijuana and tricyclic antidepressants.  5.Delirium tremens-significantly delirious, intoxicated with alcohol and presented. Significant use of alcohol use and also drug abuse. -Plan to wean the Precedex drip to off today.  - on Ativan and Haldol when necessary- also on Valium  6.h/o ETOH abuse; continue Thiamine and folic acid,  -On Ativan, haldol for withdrawals - precedex to be turned off today  Possible transfer to the floor in the next 1-2 days.    All the records are reviewed and case discussed with Care Management/Social Workerr. Management plans discussed with the patient, family and they are in agreement.  CODE STATUS: Full Code  TOTAL CRITICAL CARE TIME SPENT IN TAKING CARE OF THIS PATIENT: 35 minutes.   POSSIBLE D/C IN several days, DEPENDING ON CLINICAL CONDITION.   Dakwon Wenberg M.D on 01/31/2016 at 12:19 PM  Between 7am to 6pm - Pager - [phone]  After 6pm go to [email or web address] - password [email or web address] [facility]  [facility]  Office  [phone]  CC: Primary care physician; No PCP Per Patient

## 2016-01-31 NOTE — Progress Notes (Addendum)
Spoke with Dr. Jamison Neighbor from [facility] regarding pt having occasional runs of v-tach. Pt has been asymptomatic. Dr. Jamison Neighbor ordered to go ahead and draw morning labs and get an EKG. Will draw blood and continue to monitor pt.

## 2016-01-31 NOTE — Progress Notes (Signed)
PHARMACY - CRITICAL CARE PROGRESS NOTE  Pharmacy Consult for Unasyn (10/14), Constipation Prevention, Electrolyte Replacement and Glucose Management    No Known Allergies  Patient Measurements: Height:  (172.7 cm) Weight: 135 lb 5.8 oz (61.4 kg) IBW/kg (Calculated) : 68.4   Vital Signs: Temp: 98.9 F (37.2 C) (02/06 1500) Temp Source: Oral (02/06 1500) BP: 118/61 mmHg (02/06 1600) Pulse Rate: 100 (02/06 1600) Intake/Output from previous day: 02/05 0701 - 02/06 0700 In: 3407.5 [P.O.:360; I.V.:2197.5; IV Piggyback:850] Out: 2975 [Urine:2975] Intake/Output from this shift: Total I/O In: 1051.4 [I.V.:651.4; IV Piggyback:400] Out: 600 [Urine:600] Vent settings for last 24 hours:    Labs:  Recent Labs  01/29/16 0422  01/29/16 1505 01/30/16 0522 01/31/16 0155 01/31/16 1544  WBC 9.8  --   --   --   --   --   HGB 10.6*  --   --   --   --   --   HCT 31.0*  --   --   --   --   --   PLT 275  --   --   --   --   --   CREATININE 0.48*  --  0.66 0.67 0.50* 0.61  MG  --   < > 2.1 2.0 1.6* 2.3  PHOS  --   --  4.2 3.5 2.3*  --   < > = values in this interval not displayed. Estimated Creatinine Clearance: 91.7 mL/min (by C-G formula based on Cr of 0.61).   Recent Labs  01/30/16 0830 01/30/16 2232 01/31/16 0839  GLUCAP 106* 102* 114*    Microbiology: Recent Results (from the past 720 hour(s))  Blood culture (routine x 2)     Status: None   Collection Time: 01/21/16  4:20 AM  Result Value Ref Range Status   Specimen Description BLOOD RIGHT ANTECUBITAL  Final   Special Requests BOTTLES DRAWN AEROBIC AND ANAEROBIC  Final   Culture NO GROWTH 6 DAYS  Final   Report Status 01/27/2016 FINAL  Final  Blood culture (routine x 2)     Status: None   Collection Time: 01/21/16  4:20 AM  Result Value Ref Range Status   Specimen Description BLOOD LEFT WRIST  Final   Special Requests BOTTLES DRAWN AEROBIC AND ANAEROBIC  Final   Culture NO GROWTH 6 DAYS  Final   Report  Status 01/27/2016 FINAL  Final  MRSA PCR Screening     Status: None   Collection Time: 01/21/16  8:51 AM  Result Value Ref Range Status   MRSA by PCR NEGATIVE NEGATIVE Final    Comment:        The GeneXpert MRSA Assay (FDA approved for NASAL specimens only), is one component of a comprehensive MRSA colonization surveillance program. It is not intended to diagnose MRSA infection nor to guide or monitor treatment for MRSA infections.   Urine culture     Status: None   Collection Time: 01/23/16  6:09 PM  Result Value Ref Range Status   Specimen Description URINE, CATHETERIZED  Final   Special Requests Normal  Final   Culture NO GROWTH 1 DAY  Final   Report Status 01/25/2016 FINAL  Final  Culture, expectorated sputum-assessment     Status: None   Collection Time: 01/25/16  2:57 PM  Result Value Ref Range Status   Specimen Description SPUTUM  Final   Special Requests NONE  Final   Sputum evaluation THIS SPECIMEN IS ACCEPTABLE FOR SPUTUM CULTURE  Final   Report Status 01/25/2016 FINAL  Final  Culture, respiratory (NON-Expectorated)     Status: None   Collection Time: 01/25/16  2:57 PM  Result Value Ref Range Status   Specimen Description SPUTUM  Final   Special Requests NONE Reflexed from [id number]  Final   Gram Stain   Final    MODERATE WBC SEEN RARE SQUAMOUS EPITHELIAL CELLS PRESENT RARE GRAM POSITIVE COCCI FEW GRAM NEGATIVE RODS    Culture MODERATE GROWTH ESCHERICHIA COLI  Final   Report Status 01/27/2016 FINAL  Final   Organism ID, Bacteria ESCHERICHIA COLI  Final      Susceptibility   Escherichia coli - MIC*    AMPICILLIN Value in next row Resistant      RESISTANT>=32    CEFAZOLIN Value in next row Sensitive      SENSITIVE<=4    CEFEPIME Value in next row Sensitive      SENSITIVE<=1    CEFTAZIDIME Value in next row Sensitive      SENSITIVE<=1    CEFTRIAXONE Value in next row Sensitive      SENSITIVE<=1    CIPROFLOXACIN Value in next row Sensitive       SENSITIVE<=0.25    GENTAMICIN Value in next row Sensitive      SENSITIVE<=1    IMIPENEM Value in next row Sensitive      SENSITIVE<=0.25    TRIMETH/SULFA Value in next row Resistant      RESISTANT>=320    AMPICILLIN/SULBACTAM Value in next row Sensitive      SENSITIVE4    PIP/TAZO Value in next row Sensitive      SENSITIVE<=4    Extended ESBL Value in next row Sensitive      SENSITIVE<=4    * MODERATE GROWTH ESCHERICHIA COLI    Medications:  Scheduled:  . docusate sodium  100 mg Oral BID  . feeding supplement (ENSURE ENLIVE)  237 mL Oral BID BM  . folic acid  1 mg Oral Daily  . heparin  5,000 Units Subcutaneous 3 times per day  . sodium chloride flush  3 mL Intravenous Q12H  . thiamine  100 mg Oral Daily   Infusions:  . dexmedetomidine Stopped (01/31/16 1127)  . dextrose 5% lactated ringers with KCl 20 mEq/L 75 mL/hr at 01/31/16 1425    Assessment: Pharmacy consulted for medication management for 55 yo male ICU patient previously requiring mechanical ventilation now extubated.  Plan:  1. Continue Unasyn 3 g iv q 6 hours. Patient is currently on day 10 of 14 days of therpay.   2. Patient with BM 2/5. Will continue senna/docusate 2 tabs bid and change Miralax to PRN.  Patient with multiple BM per RN, so will change senna/docusate to docusate bid.   3. K=3.0, Mag= 1.6, Phos= 2.3. Electrolytes replaced this am with KCl 10 meq x 6, mag sulfate 3 g iv, and potassium phosphate . Will recheck potassium and magnesium at 1200.   4. Blood sugar WNL. Patient is off SSI and is not on steroids.   2/6 @ 15:44 Labs WNL.   Will recheck with am labs.  Delores Thelen K Clinical Pharmacist 01/31/2016,6:50 PM

## 2016-01-31 NOTE — Progress Notes (Signed)
[facility] Critical Care Medicine Progess Note  PULMONARY / CRITICAL CARE MEDICINE   Name: Duane Hayes MRN: [id number] DOB: [birth year]    ADMISSION DATE:  01/21/2016    CHIEF COMPLAINT:  Cardiac arrest  HISTORY OF PRESENT ILLNESS:   The patient presented to the emergency department via EMS after being found unconscious in a motel room. The patient was found with ice all over his body. Initially the patient's pulse was about 20 bpm and respirations were shallow. The patient was given Narcan and became very agitated. During an attempt to subdue the patient in order to supply supplemental oxygen he lost a pulse. CPR was started and achieved return of circulation. The patient was intubated continue to thrash. EMS personnel took this to mean that the patient may protect his airway and subsequently extubated him. However upon arrival in the emergency department the patient was not protecting his airway which prompted reintubation. He intubated and admitted to the ICU.   SUBJECTIVE:  Doing well but still has episodes of confusion. Had runs of V-tach overnight. EKG done that showed NSR. Electrolytes replaced. No other acute issues reported.   VITAL SIGNS: BP 138/98 mmHg  Pulse 31  Temp(Src) 97.6 F (36.4 C) (Axillary)  Resp 24  Ht  (1.727 m)  Wt 135 lb 5.8 oz (61.4 kg)  BMI 20.59 kg/m2  SpO2 96%  HEMODYNAMICS:    VENTILATOR SETTINGS:    INTAKE / OUTPUT: I/O last 3 completed shifts: In: 4805 [P.O.:360; I.V.:3395; IV Piggyback:1050] Out: 3700 [Urine:3700]  PHYSICAL EXAMINATION: General:  Alert, pleasant, NAD Neuro: AAO X2, follows commands, moves all extremities; no focal deficits HEENT: PERRLA, oral mucosa moist Cardiovascular: RRR, S1/S2, no MRG, no edema, +2 pules Lungs: Normal WOB, Bilateral airflow Abdomen:  Soft, NT, ND, normal bowel sounds Musculoskeletal:  +ROM, no deformites Skin:  Warm and no rash  LABS:  BMET  Recent Labs Lab 01/29/16 1505  01/30/16 0522 01/31/16 0155  NA 139 140 138  K 4.2 3.6 3.0*  CL 106 106 110  CO2 23 22 20*  BUN CREATININE 0.66 0.67 0.50*  GLUCOSE 111* 106* 113*    Electrolytes  Recent Labs Lab 01/29/16 1505 01/30/16 0522 01/31/16 0155  CALCIUM 8.8* 8.5* 7.4*  MG 2.1 2.0 1.6*  PHOS 4.2 3.5 2.3*    CBC  Recent Labs Lab 01/27/16 0127 01/28/16 0453 01/29/16 0422  WBC 8.5 9.7 9.8  HGB 9.7* 11.0* 10.6*  HCT 29.2* 33.2* 31.0*  PLT 207 277 275    Coag's No results for input(s): APTT, INR in the last 168 hours.  Sepsis Markers No results for input(s): LATICACIDVEN, PROCALCITON, O2SATVEN in the last 168 hours.  ABG  Recent Labs Lab 01/27/16 0409  PHART 7.52*  PCO2ART 38  PO2ART 50*    Liver Enzymes No results for input(s): AST, ALT, ALKPHOS, BILITOT, ALBUMIN in the last 168 hours.  Cardiac Enzymes No results for input(s): TROPONINI, PROBNP in the last 168 hours.  Glucose  Recent Labs Lab 01/28/16 2029 01/29/16 0904 01/29/16 2120 01/30/16 0830 01/30/16 2232 01/31/16 0839  GLUCAP 106* 108* 83 106* 102* 114*    Imaging Dg Chest Port 1 View  01/31/2016  CLINICAL DATA:  Follow-up of pneumonia, respiratory failure, current smoker. EXAM: PORTABLE CHEST 1 VIEW COMPARISON:  Portable chest x-ray of January 27, 2016 FINDINGS: The lungs are adequately inflated. There is persistent interstitial density in the right lower lobe. A stable 4 mm calcified nodule is noted in  the right upper lobe. The heart is top-normal in size but stable. The pulmonary vascularity is not engorged. There is no significant pleural effusion. The PICC line tip projects over the junction of the middle and distal thirds of the SVC. IMPRESSION: Improved aeration today. Persistent patchy interstitial infiltrate in the right lower lobe. When the patient can tolerate the procedure, a PA and lateral chest x-ray would be useful. Electronically Signed   By: Kennadee Walthour  [place] M.D.   On: 01/31/2016 07:33    STUDIES:  2-D echo 01/29>report pending  CULTURES: 01/31 Sputum with gram negative rods, gram positive cocci; Final report-E-coli Urin culture-NTD Blood NTD  ANTIBIOTICS: Unasyn 01/27>  SIGNIFICANT EVENTS: 01/27>Found down>PEA arrest>Intubated>admitted 02/01>Extubated  LINES/TUBES: 01/25/16 Left PICC line  ASSESSMENT / PLAN:  PULMONARY A: Acute respiratory failure secondary to drug overdose/polysubstance abuse Aspiration pneumonia. P:   -Supplemental O2 [place] prn to keep SPO2>92% -Escherichia coli in sputum, likely due to aspiration; continue unasyn -pulmonary hygiene  CARDIOVASCULAR A:  S/p Cardiopulmonary arrest Transient V-Tach-likely due to electrolyte imbalances; no arrhythmia history at baseline P:  -Telemetry -Hemodynamic monitoring per ICU protocol  RENAL A:   Hypokalemia Metabolic acidosis-Resolved P:   -BMP daily -D5/RL with KCL AT 75ML/HR  HEMATOLOGIC A:   Anemia-H/H stable P:  Monitor CBC  INFECTIOUS A:   Aspiration pneumonia P:   -Abx as above  NEUROLOGIC A:   Acute metabolic encephalopathy secondary to polysubstance abuse/OD Acute ETOH withdrawal/Delirium tremens P:   RASS goal:  -Sitter for safety -Precedex gtt and titrate for agitation -CIWA protocol -d/c Valium  -Continue scheduled Ativan  FAMILY  - Updates: No family available 01/31/16  Total CC time 30 minutes  Best Practice: Code Status:  Full. Diet: Clear liquids GI prophylaxis: n/a VTE prophylaxis:  SCD's / heparin.    Magdalene S. Tukov ANP-BC Pulmonary and Critical Care Medicine [facility]     I have evaluated patient with ANP [name], reviewed database in its entirety and discussed care plan in detail. In addition, this patient was discussed on multidisciplinary rounds.   Important exam/data findings: No respiratory distress Remains poorly oriented and occasionally agitated CXR with minimal RLL opacity, no leukocytosis, no  fever  Major problems addressed by PCCM team: Cardiopulmonary arrest - seemingly no major sequelae Suspected aspiration PNA - has completed 10 days abx Alcohol withdrawal syndrome  PLAN/REC: Dc abx DC precedex Mobilize, ambulate Advance diet IVFs adjusted - may be DC'd once meeting his nutritional needs by mouth If remains off precedex through day today, may be safely transferred to gen med floor in AM 02/07   Billy Fischer, MD PCCM service Mobile [phone] Pager [phone]

## 2016-01-31 NOTE — Plan of Care (Signed)
Problem: Phase II Progression Outcomes Goal: Tolerating prescribed nutrition plan Outcome: Completed/Met Date Met:  01/31/16 Pt on regular diet Goal: Pain controlled Outcome: Completed/Met Date Met:  01/31/16 Pt off Precedex and is currently pain free

## 2016-01-31 NOTE — Progress Notes (Signed)
EKG completed

## 2016-02-01 LAB — CBC
HEMATOCRIT: 33.9 % — AB (ref 40.0–52.0)
Hemoglobin: 11.5 g/dL — ABNORMAL LOW (ref 13.0–18.0)
MCH: 30.9 pg (ref 26.0–34.0)
MCHC: 33.9 g/dL (ref 32.0–36.0)
MCV: 91.2 fL (ref 80.0–100.0)
PLATELETS: 426 10*3/uL (ref 150–440)
RBC: 3.71 MIL/uL — AB (ref 4.40–5.90)
RDW: 14.3 % (ref 11.5–14.5)
WBC: 11.5 10*3/uL — ABNORMAL HIGH (ref 3.8–10.6)

## 2016-02-01 LAB — BASIC METABOLIC PANEL
Anion gap: 3 — ABNORMAL LOW (ref 5–15)
Anion gap: 6 (ref 5–15)
BUN: 11 mg/dL (ref 6–20)
BUN: 9 mg/dL (ref 6–20)
CALCIUM: 6.9 mg/dL — AB (ref 8.9–10.3)
CO2: 18 mmol/L — ABNORMAL LOW (ref 22–32)
CO2: 22 mmol/L (ref 22–32)
Calcium: 8.6 mg/dL — ABNORMAL LOW (ref 8.9–10.3)
Chloride: 118 mmol/L — ABNORMAL HIGH (ref 101–111)
Chloride: 109 mmol/L (ref 101–111)
Creatinine, Ser: 0.5 mg/dL — ABNORMAL LOW (ref 0.61–1.24)
Creatinine, Ser: 0.74 mg/dL (ref 0.61–1.24)
GFR calc Af Amer: >60 mL/min (ref >60)
GFR calc Af Amer: >60 mL/min (ref >60)
GLUCOSE: 146 mg/dL — AB (ref 65–99)
GLUCOSE: 535 mg/dL — AB (ref 65–99)
POTASSIUM: 4.6 mmol/L (ref 3.5–5.1)
Potassium: 5.8 mmol/L — ABNORMAL HIGH (ref 3.5–5.1)
Sodium: 139 mmol/L (ref 135–145)
Sodium: 137 mmol/L (ref 135–145)

## 2016-02-01 LAB — MAGNESIUM: Magnesium: 1.7 mg/dL (ref 1.7–2.4)

## 2016-02-01 LAB — PHOSPHORUS: Phosphorus: 2.5 mg/dL (ref 2.5–4.6)

## 2016-02-01 MED ORDER — STERILE WATER FOR INJECTION IJ SOLN
INTRAMUSCULAR | Status: AC
  Administered 2016-02-01: 1 mL
  Filled 2016-02-01: qty 10

## 2016-02-01 MED ORDER — LORAZEPAM 2 MG/ML IJ SOLN
4.0000 mg | Freq: Once | INTRAMUSCULAR | Status: AC
  Administered 2016-02-01: 4 mg via INTRAVENOUS

## 2016-02-01 MED ORDER — CHLORDIAZEPOXIDE HCL 25 MG PO CAPS
25.0000 mg | ORAL_CAPSULE | Freq: Three times a day (TID) | ORAL | Status: DC
  Administered 2016-02-01 – 2016-02-03 (×6): 25 mg via ORAL
  Filled 2016-02-01 (×6): qty 1

## 2016-02-01 MED ORDER — QUETIAPINE FUMARATE 100 MG PO TABS
100.0000 mg | ORAL_TABLET | Freq: Every day | ORAL | Status: DC
  Administered 2016-02-01 – 2016-02-02 (×2): 100 mg via ORAL
  Filled 2016-02-01 (×2): qty 1

## 2016-02-01 MED ORDER — ZOLPIDEM TARTRATE 5 MG PO TABS
5.0000 mg | ORAL_TABLET | Freq: Once | ORAL | Status: AC
  Administered 2016-02-01: 5 mg via ORAL
  Filled 2016-02-01: qty 1

## 2016-02-01 MED ORDER — ENOXAPARIN SODIUM 40 MG/0.4ML ~~LOC~~ SOLN
40.0000 mg | SUBCUTANEOUS | Status: DC
  Administered 2016-02-01 – 2016-02-07 (×7): 40 mg via SUBCUTANEOUS
  Filled 2016-02-01 (×7): qty 0.4

## 2016-02-01 MED ORDER — ALTEPLASE 2 MG IJ SOLR
2.0000 mg | Freq: Once | INTRAMUSCULAR | Status: AC | PRN
  Administered 2016-02-01: 1 mg
  Filled 2016-02-01 (×2): qty 2

## 2016-02-01 MED ORDER — CLONAZEPAM 1 MG PO TABS
1.0000 mg | ORAL_TABLET | Freq: Two times a day (BID) | ORAL | Status: DC
  Administered 2016-02-01: 1 mg via ORAL
  Filled 2016-02-01: qty 1

## 2016-02-01 MED ORDER — KCL-LACTATED RINGERS-D5W 20 MEQ/L IV SOLN
INTRAVENOUS | Status: DC
  Administered 2016-02-02: 05:00:00 via INTRAVENOUS
  Filled 2016-02-01 (×4): qty 1000

## 2016-02-01 MED ORDER — LORAZEPAM 2 MG/ML IJ SOLN
INTRAMUSCULAR | Status: AC
  Administered 2016-02-01: 4 mg via INTRAVENOUS
  Filled 2016-02-01: qty 2

## 2016-02-01 MED ORDER — LORAZEPAM 2 MG/ML IJ SOLN
INTRAMUSCULAR | Status: AC
  Filled 2016-02-01: qty 2

## 2016-02-01 NOTE — Progress Notes (Signed)
PT Cancellation Note  Patient Details Name: Duane Hayes MRN: [id number] DOB: 1960-12-30   Cancelled Treatment:    Reason Eval/Treat Not Completed: Other (comment). Chart review noted patient agitated and combative this morn and and early afternoon. Sought nurse to question pt's appropriateness for PT today. Nurse currently in room along with 2 security guards; pt yelling. Will hold PT today and re attempt tomorrow.    Elsie Stain Bishop 02/01/2016, 2:44 PM

## 2016-02-01 NOTE — Progress Notes (Signed)
Patient behavior unchanged patient continues to be a danger to self and staff.  Order placed for second dose of ativan as per Dr. Jarrett Soho note.

## 2016-02-01 NOTE — Progress Notes (Signed)
[facility] Medicine Progess Note  PULMONARY / CRITICAL CARE MEDICINE   Name: Duane Hayes MRN: [id number] DOB: Sep 12, 1961    ADMISSION DATE:  01/21/2016    CHIEF COMPLAINT:  Cardiac arrest  HISTORY OF PRESENT ILLNESS:   The patient presented to the emergency department via EMS after being found unconscious in a motel room. The patient was found with ice all over his body. Initially the patient's pulse was about 20 bpm and respirations were shallow. The patient was given Narcan and became very agitated. During an attempt to subdue the patient in order to supply supplemental oxygen he lost a pulse. CPR was started and achieved return of circulation. The patient was intubated continue to thrash. EMS personnel took this to mean that the patient may protect his airway and subsequently extubated him. However upon arrival in the emergency department the patient was not protecting his airway which prompted reintubation. He intubated and admitted to the ICU.   SUBJECTIVE:  Agitated overnight and this morning. Patient states that he wants to go home. He is combative with staff  and trying to get up and pull IVs PICC.    VITAL SIGNS: BP 138/98 mmHg  Pulse 31  Temp(Src) 97.6 F (36.4 C) (Axillary)  Resp 24  Ht  (1.727 m)  Wt 135 lb 5.8 oz (61.4 kg)  BMI 20.59 kg/m2  SpO2 96%  HEMODYNAMICS:    VENTILATOR SETTINGS:    INTAKE / OUTPUT: I/O last 3 completed shifts: In: 4805 [P.O.:360; I.V.:3395; IV Piggyback:1050] Out: 3700 [Urine:3700]  PHYSICAL EXAMINATION: General: Alert, belligerent, NAD Neuro: AAO X 2, follows commands, moves all extremities; no focal deficits HEENT: PERRLA, oral mucosa moist Cardiovascular: RRR, S1/S2, no MRG, no edema, +2 pules Lungs: Normal WOB, bilateral airflow Abdomen:  Soft, NT, ND, normal bowel sounds Musculoskeletal:  +ROM, no deformites Skin:  Warm and no rash  LABS:  BMET  Recent Labs Lab 01/29/16 1505 01/30/16 0522  01/31/16 0155  NA 139 140 138  K 4.2 3.6 3.0*  CL 106 106 110  CO2 23 22 20*  BUN CREATININE 0.66 0.67 0.50*  GLUCOSE 111* 106* 113*    Electrolytes  Recent Labs Lab 01/29/16 1505 01/30/16 0522 01/31/16 0155  CALCIUM 8.8* 8.5* 7.4*  MG 2.1 2.0 1.6*  PHOS 4.2 3.5 2.3*    CBC  Recent Labs Lab 01/27/16 0127 01/28/16 0453 01/29/16 0422  WBC 8.5 9.7 9.8  HGB 9.7* 11.0* 10.6*  HCT 29.2* 33.2* 31.0*  PLT 207 277 275    Coag's No results for input(s): APTT, INR in the last 168 hours.  Sepsis Markers No results for input(s): LATICACIDVEN, PROCALCITON, O2SATVEN in the last 168 hours.  ABG  Recent Labs Lab 01/27/16 0409  PHART 7.52*  PCO2ART 38  PO2ART 50*    Liver Enzymes No results for input(s): AST, ALT, ALKPHOS, BILITOT, ALBUMIN in the last 168 hours.  Cardiac Enzymes No results for input(s): TROPONINI, PROBNP in the last 168 hours.  Glucose  Recent Labs Lab 01/28/16 2029 01/29/16 0904 01/29/16 2120 01/30/16 0830 01/30/16 2232 01/31/16 0839  GLUCAP 106* 108* 83 106* 102* 114*   Imaging -No CXR 02/07  STUDIES:  2-D echo 01/29>report pending  CULTURES: 01/31 Sputum with gram negative rods, gram positive cocci; Final report-E-coli Urin culture-NTD Blood NTD  ANTIBIOTICS: Unasyn 01/27>  SIGNIFICANT EVENTS: 01/27>Found down>PEA arrest>Intubated>admitted 02/01>Extubated  LINES/TUBES: 01/25/16 Left PICC line  ASSESSMENT / PLAN:  PULMONARY A: Acute respiratory failure  secondary to drug overdose/polysubstance abuse Aspiration pneumonia-afebrile and improved CXR P:    -Supplemental O2 [place] prn to keep SPO2>92%  -Escherichia coli in sputum, likely due to aspiration; D/C Unasyn  -Pulmonary hygiene  CARDIOVASCULAR A:  S/p Cardiopulmonary arrest Transient V-Tach-likely due to electrolyte imbalances; no arrhythmia history at baseline P:  -Telemetry -Hemodynamic monitoring per ICU protocol  RENAL A:    Hypokalemia Metabolic acidosis-Resolved P:   -BMP daily -D5/RL with KCL AT 75ML/HR  HEMATOLOGIC A:   Anemia-H/H stable P:  Monitor CBC  INFECTIOUS A:   Aspiration pneumonia-improved P:   -off antibiotics -Monitor and repeat cultures if febrile  NEUROLOGIC A:   Acute metabolic encephalopathy secondary to polysubstance abuse/OD Acute ETOH withdrawal/Delirium tremens P:   RASS goal:  -Sitter for safety -Precedex gtt and titrate for agitation -CIWA protocol -d/c Valium  -Continue scheduled Ativan -Start clonazepam  q12h -Seroquel 100 mg QHS  FAMILY  - Updates: No family available 01/31/16  Total CC time 30 minutes  Best Practice: Code Status:  Full. Diet: Clear liquids GI prophylaxis: n/a VTE prophylaxis:  SCD's / heparin.    [facility] ANP-BC Pulmonary and Critical Care Medicine [facility] [phone]     PCCM ATTENDING ATTESTATION: I have evaluated patient with ANP [name], reviewed database in its entirety and discussed care plan in detail. In addition, this patient was discussed on multidisciplinary rounds.   Important exam findings: No respiratory distress Very weak Poorly oriented but knows where he is Combative and insisting to leave  No other major exam findings  Major problems addressed by PCCM team: Combativeness with delirium Severe deconditioning Alcohol withdrawal syndrome I deem him to not be competent to make the decision to leave AMA (not to mention that he would not make it to the door due to profound weakness)   PLAN/REC: We added clonazepam and an HS dose of quetiapine Wean dexmedetomidine to off Continue PRN lorazepam and haloperidol   Billy Fischer, MD PCCM service Mobile [phone] Pager [phone]

## 2016-02-01 NOTE — Progress Notes (Signed)
[facility] - [place] at [facility]   PATIENT NAME: Duane Hayes    MR#:  [id number]  DATE OF BIRTH:  Jun 03, 1961  SUBJECTIVE:  CHIEF COMPLAINT:   Chief Complaint  Patient presents with  . Drug Overdose   - Off precedex drip now, alert, widely awake, sitting on th bed- 2 security guards, RN and sister at bedside as patient go agitated. - he is seeing bedbugs crawling all around  REVIEW OF SYSTEMS:  Review of Systems  Unable to perform ROS: mental acuity    DRUG ALLERGIES:  No Known Allergies  VITALS:  Blood pressure 92/49, pulse 44, temperature 96.2 F (35.7 C), temperature source Axillary, resp. rate 16, height  (1.727 m), weight 61.4 kg (135 lb 5.8 oz), SpO2 99 %.  PHYSICAL EXAMINATION:  Physical Exam  GENERAL:  55 y.o.-year-old patient lying in the bed, sitting in bed, agitated, speaking in full sentences but not oriented. EYES: Pupils equal, round, reactive to light and accommodation. No scleral icterus. Extraocular muscles intact.  HEENT: Head atraumatic, normocephalic. Oropharynx and nasopharynx clear.  NECK:  Supple, no jugular venous distention. No thyroid enlargement, no tenderness.  LUNGS: Normal breath sounds bilaterally, no wheezing, rales,rhonchi or crepitation. No use of accessory muscles of respiration. Decreased bibasilar breath sounds. CARDIOVASCULAR: S1, S2 normal. No murmurs, rubs, or gallops.  ABDOMEN: Soft, nontender, nondistended. Bowel sounds present. No organomegaly or mass.  EXTREMITIES: No pedal edema, cyanosis, or clubbing.  NEUROLOGIC: Not cooperative for neuro exam. Sitting up in bed. Speech is clear and able to move all 4 extremities without any focal deficits. PSYCHIATRIC: The patient is very confused and delirious.  SKIN: No obvious rash, lesion, or ulcer.    LABORATORY PANEL:   CBC  Recent Labs Lab 02/01/16 0432  WBC 11.5*  HGB 11.5*  HCT 33.9*  PLT 426    ------------------------------------------------------------------------------------------------------------------  Chemistries   Recent Labs Lab 02/01/16 0432 02/01/16 0513  NA 139 137  K 5.8* 4.6  CL 118* 109  CO2 18* 22  GLUCOSE 535* 146*  BUN 9 11  CREATININE 0.50* 0.74  CALCIUM 6.9* 8.6*  MG 1.7  --    ------------------------------------------------------------------------------------------------------------------  Cardiac Enzymes No results for input(s): TROPONINI in the last 168 hours. ------------------------------------------------------------------------------------------------------------------  RADIOLOGY:  Dg Chest Port 1 View  01/31/2016  CLINICAL DATA:  Follow-up of pneumonia, respiratory failure, current smoker. EXAM: PORTABLE CHEST 1 VIEW COMPARISON:  Portable chest x-ray of January 27, 2016 FINDINGS: The lungs are adequately inflated. There is persistent interstitial density in the right lower lobe. A stable 4 mm calcified nodule is noted in the right upper lobe. The heart is top-normal in size but stable. The pulmonary vascularity is not engorged. There is no significant pleural effusion. The PICC line tip projects over the junction of the middle and distal thirds of the SVC. IMPRESSION: Improved aeration today. Persistent patchy interstitial infiltrate in the right lower lobe. When the patient can tolerate the procedure, a PA and lateral chest x-ray would be useful. Electronically Signed   By: David  [place] M.D.   On: 01/31/2016 07:33    EKG:   Orders placed or performed during the hospital encounter of 01/21/16  . ED EKG  . ED EKG  . EKG 12-Lead  . EKG 12-Lead  . EKG 12-Lead  . EKG 12-Lead  . EKG 12-Lead  . EKG 12-Lead  . EKG 12-Lead  . EKG 12-Lead    ASSESSMENT AND PLAN:   55 year old male was noted  to be unconscious in a motel room. He was intubated for airway protection. Possible overdose suspected  1.Acute respiratory failure : due to Drug  Overdose and alcohol intoxication; And aspiration pneumonia - Spurum cultures with E.coli - Appreciate pulmonary critical care consultation. -Patient is extubated and currently remains on nasal cannula. -negative blood cultures so far. -Chest x-ray with improving infiltrate. -On IV Unasyn. Total 14 days treatment per Intensivist   2.Cardiac arrest with hypothermia/very brief CPR; hypothermia and bradycardia are resolved.  3. Hypokalemia- replaced  4.Drug overdose-possible suicidal attempt; according to sister. continue sitter.  -Psych consult. Needs to be under committment - Urine tox when admitted was positive for amphetamines, cocaine, marijuana and tricyclic antidepressants.  5.Delirium tremens-significantly delirious, intoxicated with alcohol and presented. Significant use of alcohol use and also drug abuse. -Precedex drip off today. Significant DTs- visual hallucinations -on Ativan and Haldol when necessary-  - klonopin started today - will add librium- needs to be under commitment  6.h/o ETOH abuse; continue Thiamine and folic acid,  -On Ativan, haldol for withdrawals- librium added - Precedex turned off today  Possible transfer to the floor in the next 1-2 days.    All the records are reviewed and case discussed with Care Management/Social Workerr. Management plans discussed with the patient, family and they are in agreement.  CODE STATUS: Full Code  TOTAL CRITICAL CARE TIME SPENT IN TAKING CARE OF THIS PATIENT: 35 minutes.   POSSIBLE D/C IN 2-3 days, DEPENDING ON CLINICAL CONDITION.   Duane Hayes M.D on 02/01/2016 at 3:05 PM  Between 7am to 6pm - Pager - [phone]  After 6pm go to [email or web address] - password [email or web address] [facility]  [facility]  Office  [phone]  CC: Primary care physician; No PCP Per Patient

## 2016-02-01 NOTE — Progress Notes (Signed)
Pt alert and oriented during shift however pt increasingly restless, impulsive, agitated, and attempting to get out of bed during shift notified Dr. Dellie Catholic of pts increased agitation despite administration of prn haldol and ativan per Dr. Dellie Catholic orders administered po Remus Loffler Dr. Dellie Catholic gave orders to restart Precedex drip if pts agitation and impulsiveness did not improve post administration of ambien, Precedex drip restarted secondary to agitation and prn ativan administered pt now resting comfortably with safety sitter at bedside; sinus brady on cardiac monitor other vss; pt up to [facility] with 2 person assist; no c/o pain; will continue to monitor and assess pt

## 2016-02-01 NOTE — Progress Notes (Signed)
PHARMACY - CRITICAL CARE PROGRESS NOTE  Pharmacy Consult for Constipation Prevention, Electrolyte Replacement and Glucose Management    No Known Allergies  Patient Measurements: Height:  (172.7 cm) Weight: 135 lb 5.8 oz (61.4 kg) IBW/kg (Calculated) : 68.4   Vital Signs: Temp: 96.2 F (35.7 C) (02/07 0800) Temp Source: Axillary (02/07 0800) BP: 94/59 mmHg (02/07 0800) Pulse Rate: 44 (02/07 0800) Intake/Output from previous day: 02/06 0701 - 02/07 0700 In: 1051.4 [I.V.:651.4; IV Piggyback:400] Out: 600 [Urine:600] Intake/Output from this shift: Total I/O In: 3 [I.V.:3] Out: 250 [Urine:250] Vent settings for last 24 hours:    Labs:  Recent Labs  01/30/16 0522 01/31/16 0155 01/31/16 1544 02/01/16 0432 02/01/16 0513  WBC  --   --   --  11.5*  --   HGB  --   --   --  11.5*  --   HCT  --   --   --  33.9*  --   PLT  --   --   --  426  --   CREATININE 0.67 0.50* 0.61 0.50* 0.74  MG 2.0 1.6* 2.3 1.7  --   PHOS 3.5 2.3*  --  2.5  --    Estimated Creatinine Clearance: 91.7 mL/min (by C-G formula based on Cr of 0.74).   Recent Labs  01/30/16 0830 01/30/16 2232 01/31/16 0839  GLUCAP 106* 102* 114*    Microbiology: Recent Results (from the past 720 hour(s))  Blood culture (routine x 2)     Status: None   Collection Time: 01/21/16  4:20 AM  Result Value Ref Range Status   Specimen Description BLOOD RIGHT ANTECUBITAL  Final   Special Requests BOTTLES DRAWN AEROBIC AND ANAEROBIC  Final   Culture NO GROWTH 6 DAYS  Final   Report Status 01/27/2016 FINAL  Final  Blood culture (routine x 2)     Status: None   Collection Time: 01/21/16  4:20 AM  Result Value Ref Range Status   Specimen Description BLOOD LEFT WRIST  Final   Special Requests BOTTLES DRAWN AEROBIC AND ANAEROBIC  Final   Culture NO GROWTH 6 DAYS  Final   Report Status 01/27/2016 FINAL  Final  MRSA PCR Screening     Status: None   Collection Time: 01/21/16  8:51 AM  Result Value Ref Range  Status   MRSA by PCR NEGATIVE NEGATIVE Final    Comment:        The GeneXpert MRSA Assay (FDA approved for NASAL specimens only), is one component of a comprehensive MRSA colonization surveillance program. It is not intended to diagnose MRSA infection nor to guide or monitor treatment for MRSA infections.   Urine culture     Status: None   Collection Time: 01/23/16  6:09 PM  Result Value Ref Range Status   Specimen Description URINE, CATHETERIZED  Final   Special Requests Normal  Final   Culture NO GROWTH 1 DAY  Final   Report Status 01/25/2016 FINAL  Final  Culture, expectorated sputum-assessment     Status: None   Collection Time: 01/25/16  2:57 PM  Result Value Ref Range Status   Specimen Description SPUTUM  Final   Special Requests NONE  Final   Sputum evaluation THIS SPECIMEN IS ACCEPTABLE FOR SPUTUM CULTURE  Final   Report Status 01/25/2016 FINAL  Final  Culture, respiratory (NON-Expectorated)     Status: None   Collection Time: 01/25/16  2:57 PM  Result Value Ref Range Status  Specimen Description SPUTUM  Final   Special Requests NONE Reflexed from [phone]  Final   Gram Stain   Final    MODERATE WBC SEEN RARE SQUAMOUS EPITHELIAL CELLS PRESENT RARE GRAM POSITIVE COCCI FEW GRAM NEGATIVE RODS    Culture MODERATE GROWTH ESCHERICHIA COLI  Final   Report Status 01/27/2016 FINAL  Final   Organism ID, Bacteria ESCHERICHIA COLI  Final      Susceptibility   Escherichia coli - MIC*    AMPICILLIN Value in next row Resistant      RESISTANT>=32    CEFAZOLIN Value in next row Sensitive      SENSITIVE<=4    CEFEPIME Value in next row Sensitive      SENSITIVE<=1    CEFTAZIDIME Value in next row Sensitive      SENSITIVE<=1    CEFTRIAXONE Value in next row Sensitive      SENSITIVE<=1    CIPROFLOXACIN Value in next row Sensitive      SENSITIVE<=0.25    GENTAMICIN Value in next row Sensitive      SENSITIVE<=1    IMIPENEM Value in next row Sensitive      SENSITIVE<=0.25     TRIMETH/SULFA Value in next row Resistant      RESISTANT>=320    AMPICILLIN/SULBACTAM Value in next row Sensitive      SENSITIVE4    PIP/TAZO Value in next row Sensitive      SENSITIVE<=4    Extended ESBL Value in next row Sensitive      SENSITIVE<=4    * MODERATE GROWTH ESCHERICHIA COLI    Medications:  Scheduled:  . clonazePAM  1 mg Oral BID  . docusate sodium  100 mg Oral BID  . enoxaparin (LOVENOX) injection  40 mg Subcutaneous Q24H  . feeding supplement (ENSURE ENLIVE)  237 mL Oral BID BM  . folic acid  1 mg Oral Daily  . QUEtiapine  100 mg Oral QHS  . sodium chloride flush  3 mL Intravenous Q12H  . thiamine  100 mg Oral Daily   Infusions:  . dexmedetomidine 0.3 mcg/kg/hr (02/01/16 0844)  . dextrose 5% lactated ringers with KCl 20 mEq/L 75 mL/hr at 02/01/16 0600    Assessment: Pharmacy consulted for medication management for 55 yo male ICU patient previously requiring mechanical ventilation now extubated.  Plan:  1. Patient with multiple BM 2/6. Will continue docusate 100 mg bid.   2. Electrolytes are wnl. Will f/u am labs.   3. Blood sugar WNL. Patient is off SSI and is not on steroids.    Luisa Hart D Clinical Pharmacist 02/01/2016,12:07 PM

## 2016-02-01 NOTE — Progress Notes (Signed)
Pt was combative and verbally abusive to staff at beginning of shift. Haldol was administered.  Pt was walked about 50 feet per Dr. Bard Herbert orders. Pt's confusion and aggressive behavior continued. Ativan was given.   Pt began to grab nurses in inappropriate areas, and began to insist that the CNA "get naked and get in the bed" stating "I don't won't my breakfast, I want you for breakfast." To the CNA.  Security called and officer remains in the room. Dr.Simmonds notified.  of Klonopin ordered and given.  Pt remains verbally abusive, pulling at lines, disconnecting equipment and being combative with staff.  Security was called repeatedly for aggressive behavior towards staff.  MD notified no new orders given.  Mitts were placed on the pt at 1200, due to pt grabbing at PICC line and bedrails and removing EKG leads.  Pt continues to cuss and be verbally abusive and inappropriate with staff. Pt given haldol and ativan again Dr. Nemiah Commander notified.   About 1400 pt began to hallucinate and see "bed bugs eating me". He was restarted on Precedex drip and given ativan.  Dr. Nemiah Commander came and spoke with pt. New orders given for psych consult and Seroquel was ordered.   1830 pt continues be physically aggressive, verballly abusive and inappropriatewith staff and has begun spitting and biting. Security called for more staff for help restraining.  MD paged  of Ativan ordered and administered.   No change in condition, agitation and aggressive behaviour continuing to increase. MD notified order for   of ativan. Medication administered. 1900 Pt continues to be aggressive and combative. MD called, no new orders given at this time.  Will continue to assess.

## 2016-02-01 NOTE — Care Management (Signed)
Precedex drip had to be restarted due to increased agitation that did not respond to prn haldol, ativan and ambien.  Requiring sitter

## 2016-02-01 NOTE — Progress Notes (Signed)
Informed by nursing staff patient still having signs/symptoms of withdrawal Off precedex, requiring large amounts of ativan, still symptomatic  Ativan  x1 Can be repeated in x2  For a total of 3 doses of  IV ativan If still symptomatic at that time will require ativan gtt

## 2016-02-01 NOTE — Progress Notes (Signed)
Patient is being aggressive hitting and kicking the staff. Patient attempting to pull at medical equipment. Nurse unable to orient or calm patient. Security and sitter unable to safely keep patient in bed. Dr. Clint Guy notified of situation.  Orders received.

## 2016-02-01 NOTE — Progress Notes (Signed)
eLink Physician-Brief Progress Note Patient Name: Duane Hayes DOB: October 11, 1961 MRN: [id number]   Date of Service  02/01/2016  HPI/Events of Note  Patient requests sleep aide. Anxious.  eICU Interventions  Will order Ambien 5 mg PO now.      Intervention Category Minor Interventions: Routine modifications to care plan (e.g. PRN medications for pain, fever)  Duane Hayes 02/01/2016, 12:06 AM

## 2016-02-01 NOTE — Progress Notes (Signed)
Pt restarted on precedex at 2022 per Dr. Elisabeth Pigeon.  Continues to be uncooperative and trying to get out of bed at 2055.  Pt insists on getting out of bed and wanting to leave.  At 2100 RN and NT/sitter assisted pt ambulating once around the unit with a walker. Pt slightly unsteady on feet, however security followed behind pt with recliner for pt to rest.  Pt returned back to bed with no problems, stating he appreciated being allowed to get up and walk.  Pt remains calm and cooperative at this time.  Will continue to monitor.

## 2016-02-01 NOTE — Progress Notes (Signed)
Nurse called me that patient is very agitated, I'm to get out of the bed. That to called security personnel to help keep him confined to the bed.  I went in examining the patient, he is agitated, disoriented, some shaking. His vitals are stable.  Assessment and plan  * Alcohol withdrawal delirium    Patient was extubated earlier today, and he was taken off from precidex drip  Currently his vitals are stable.  He received 8 mg injection Ativan in last 1 hour.  Still feels very agitated and disoriented.   I suggest to start on low dose Precedex drip, and gradually titrate up as his blood pressure can tolerate.  Also advised to increase his IV fluid from 75 ML to 100 ML per hour.   Critical care time spent in this management is 25 minutes.

## 2016-02-02 LAB — BASIC METABOLIC PANEL
ANION GAP: 7 (ref 5–15)
BUN: 7 mg/dL (ref 6–20)
CALCIUM: 8.1 mg/dL — AB (ref 8.9–10.3)
CO2: 21 mmol/L — ABNORMAL LOW (ref 22–32)
CREATININE: 0.67 mg/dL (ref 0.61–1.24)
Chloride: 109 mmol/L (ref 101–111)
GFR calc Af Amer: >60 mL/min (ref >60)
GLUCOSE: 319 mg/dL — AB (ref 65–99)
Potassium: 4.8 mmol/L (ref 3.5–5.1)
Sodium: 137 mmol/L (ref 135–145)

## 2016-02-02 LAB — PHOSPHORUS: Phosphorus: 4.1 mg/dL (ref 2.5–4.6)

## 2016-02-02 LAB — MAGNESIUM: MAGNESIUM: 1.8 mg/dL (ref 1.7–2.4)

## 2016-02-02 MED ORDER — MAGNESIUM OXIDE 400 (241.3 MG) MG PO TABS
800.0000 mg | ORAL_TABLET | Freq: Once | ORAL | Status: AC
  Administered 2016-02-02: 800 mg via ORAL
  Filled 2016-02-02: qty 2

## 2016-02-02 MED ORDER — DEXTROSE IN LACTATED RINGERS 5 % IV SOLN
INTRAVENOUS | Status: DC
  Administered 2016-02-02: 19:00:00 via INTRAVENOUS

## 2016-02-02 MED ORDER — NICOTINE POLACRILEX 2 MG MT GUM
2.0000 mg | CHEWING_GUM | OROMUCOSAL | Status: DC | PRN
  Administered 2016-02-02 – 2016-02-07 (×3): 2 mg via ORAL
  Filled 2016-02-02 (×6): qty 1

## 2016-02-02 NOTE — Progress Notes (Signed)
PT Cancellation Note  Patient Details Name: Duane Hayes MRN: [id number] DOB: Jul 23, 1961   Cancelled Treatment:    Reason Eval/Treat Not Completed: Patient's level of consciousness. PT received discontinue orders, it appears likely due to confusion and physical aggression. Please re-consult when patient is alert and appropriate for mobility evaluation.   Kerin Ransom, PT, DPT    02/02/2016, 10:20 AM

## 2016-02-02 NOTE — Progress Notes (Signed)
Speech Therapy Note: received order, reviewed chart notes, consulted NSG re: pt's status today. Pt is sleeping, "finally resting" per NSG. NSG Aide stated pt ate at breakfast and lunch having some difficulty w/ the texture of eggs but not w/ the salad and chicken at lunch. No overt s/s of aspiration have been noted when drinking liquids per NSG.  Will f/u w/ BSE in morning w/ pt during breakfast meal when awake/alert. Will modify diet now to a Dys. 3 d/t lacking full dentition. Aspiration precautions; meds in puree. NSG agreed.

## 2016-02-02 NOTE — Progress Notes (Signed)
PT Cancellation Note  Patient Details Name: Duane Hayes MRN: [id number] DOB: Sep 30, 1961   Cancelled Treatment:    Reason Eval/Treat Not Completed: Patient's level of consciousness. Pt received consult for re-evaluation for patient after he had been transferred to the floor. PT entered the room to find patient with sitter and blanket over his head. Sitter reports patient has been extremely difficult and has just now calmed down. RN staff asked PT to return at a later date/time for re-evaluation.  Kerin Ransom, PT, DPT    02/02/2016, 6:27 PM

## 2016-02-02 NOTE — Progress Notes (Signed)
Inpatient Diabetes Program Recommendations  AACE/ADA: New Consensus Statement on Inpatient Glycemic Control (2015)  Target Ranges:  Prepandial:   less than 140 mg/dL      Peak postprandial:   less than 180 mg/dL (1-2 hours)      Critically ill patients:  140 - 180 mg/dL   Review of Glycemic Control:  Results for KHALEB, BROZ (MRN [id number]) as of 02/02/2016 15:39  Ref. Range 01/31/2016 15:44 02/01/2016 04:32 02/01/2016 05:13 02/02/2016 05:09  Glucose Latest Ref Range: 65-99 mg/dL 409 (H) 811 (HH) 914 (H) 319 (H)  Results for JAYKE, CAUL (MRN [id number]) as of 02/02/2016 15:39  Ref. Range 01/21/2016 09:31  Hemoglobin A1C Latest Ref Range: 4.0-6.0 % 5.6    Diabetes history: None  Outpatient Diabetes medications: None Current orders for Inpatient glycemic control:  No medications ordered  Inpatient Diabetes Program Recommendations:    Note that lab glucose has been elevated on both 02/01/16 and 02/02/16.  However repeat lab on 2/17 was 146 mg/dL.  Please consider checking CBG's tid with meals and HS-  If greater than 150 mg/dL, may consider adding Novolog sensitive correction.  Will follow.  Thanks, Beryl Meager, RN, BC-ADM Inpatient Diabetes Coordinator Pager [phone] (8a-5p)

## 2016-02-02 NOTE — Progress Notes (Addendum)
[facility] - [place] at [facility]   PATIENT NAME: Duane Hayes    MR#:  [id number]  DATE OF BIRTH:  [birth year]  SUBJECTIVE:  CHIEF COMPLAINT:   Chief Complaint  Patient presents with  . Drug Overdose   - Off precedex drip now, somewhat more alert, wife at bedside. He wants to go out with his wife REVIEW OF SYSTEMS:  Review of Systems  Unable to perform ROS: mental acuity    DRUG ALLERGIES:  No Known Allergies  VITALS:  Blood pressure 146/81, pulse 93, temperature 97.5 F (36.4 C), temperature source Oral, resp. rate 12, height  (1.727 m), weight 61.4 kg (135 lb 5.8 oz), SpO2 100 %.  PHYSICAL EXAMINATION:  Physical Exam  GENERAL:  55 y.o.-year-old patient lying in the bed, sitting in bed, agitated, speaking in full sentences but not oriented. EYES: Pupils equal, round, reactive to light and accommodation. No scleral icterus. Extraocular muscles intact.  HEENT: Head atraumatic, normocephalic. Oropharynx and nasopharynx clear.  NECK:  Supple, no jugular venous distention. No thyroid enlargement, no tenderness.  LUNGS: Normal breath sounds bilaterally, no wheezing, rales,rhonchi or crepitation. No use of accessory muscles of respiration. Decreased bibasilar breath sounds. CARDIOVASCULAR: S1, S2 normal. No murmurs, rubs, or gallops.  ABDOMEN: Soft, nontender, nondistended. Bowel sounds present. No organomegaly or mass.  EXTREMITIES: No pedal edema, cyanosis, or clubbing.  NEUROLOGIC: Not cooperative for neuro exam. Sitting up in bed. Speech is clear and able to move all 4 extremities without any focal deficits. PSYCHIATRIC: The patient seems somewhat confused and delirious.  SKIN: No obvious rash, lesion, or ulcer.  LABORATORY PANEL:   CBC  Recent Labs Lab 02/01/16 0432  WBC 11.5*  HGB 11.5*  HCT 33.9*  PLT 426    ------------------------------------------------------------------------------------------------------------------  Chemistries   Recent Labs Lab 02/02/16 0509  NA 137  K 4.8  CL 109  CO2 21*  GLUCOSE 319*  BUN 7  CREATININE 0.67  CALCIUM 8.1*  MG 1.8    ASSESSMENT AND PLAN:   55 year old male was noted to be unconscious in a motel room. He was intubated for airway protection. Possible overdose suspected  1.Acute respiratory failure : due to Drug Overdose and alcohol intoxication; And aspiration pneumonia - now seem to have resolved. - Sputum cultures with E.coli - Appreciate pulmonary critical care consultation. -Patient remains on nasal cannula. -negative blood cultures so far. -Chest x-ray with improving infiltrate. - off Abx.  2.Cardiac arrest with hypothermia/very brief CPR; hypothermia and bradycardia are resolved.  3. Hypokalemia- replaced and resolved  4.Drug overdose-possible suicidal attempt; continue sitter.  -Psych consult requested. Needs to be under committment - Urine tox when admitted was positive for amphetamines, cocaine, marijuana and tricyclic antidepressants.  5.Delirium tremens-significantly delirious, intoxicated with alcohol and presented. Significant use of alcohol use and also drug abuse. -Precedex drip off. Significant DTs- visual hallucinations -on Ativan and Haldol when necessary-  - continue klonopin & librium- needs to be under commitment  6.h/o ETOH abuse; continue Thiamine and folic acid,  -On Ativan, haldol for withdrawals- librium added  7. Stage 2 Lt ischial tuberosity pressure ulcer: noted by nurse, present on admission. Dressing applied.   Transfer to floor today    All the records are reviewed and case discussed with Care Management/Social Worker. Management plans discussed with the patient, family and they are in agreement.  CODE STATUS: Full Code  TOTAL CRITICAL CARE TIME SPENT IN TAKING CARE OF THIS PATIENT: 35  minutes.   POSSIBLE  D/C IN 1-2 days, DEPENDING ON CLINICAL CONDITION and PSYCH  eval   [facility]  [facility], Trystyn Sitts M.D on 02/02/2016 at 5:10 PM  Between 7am to 6pm - Pager - [phone]  After 6pm go to [email or web address] - password [email or web address] [facility]  [place] [place] [facility]  Office  [phone]  CC: Primary care physician; No PCP Per Patient

## 2016-02-02 NOTE — Care Management (Signed)
patient is for transfer out of icu today.  His behavior has been calm and has ambulated around the icu unit with nursing several times today without problems.  Obtained or for physical therapy to reassess.

## 2016-02-02 NOTE — Progress Notes (Signed)
[facility] Critical Care Medicine Progess Note  PULMONARY / CRITICAL CARE MEDICINE   Name: Duane Hayes MRN: [id number] DOB: Oct 31, 1961    ADMISSION DATE:  01/21/2016    CHIEF COMPLAINT:  Cardiac arrest  HISTORY OF PRESENT ILLNESS:   The patient presented to the emergency department via EMS after being found unconscious in a motel room. The patient was found with ice all over his body. Initially the patient's pulse was about 20 bpm and respirations were shallow. The patient was given Narcan and became very agitated. During an attempt to subdue the patient in order to supply supplemental oxygen he lost a pulse. CPR was started and achieved return of circulation. The patient was intubated continue to thrash. EMS personnel took this to mean that the patient may protect his airway and subsequently extubated him. However upon arrival in the emergency department the patient was not protecting his airway which prompted reintubation. He intubated and admitted to the ICU.   SUBJECTIVE:  Awake and reports doing well. He is still combative and verbally abusive to staff and very weak. Requires rolling walker and two staff assistance with ambulation.   VITAL SIGNS: BP 138/98 mmHg  Pulse 31  Temp(Src) 97.6 F (36.4 C) (Axillary)  Resp 24  Ht  (1.727 m)  Wt 135 lb 5.8 oz (61.4 kg)  BMI 20.59 kg/m2  SpO2 96%  HEMODYNAMICS:    VENTILATOR SETTINGS:    INTAKE / OUTPUT: I/O last 3 completed shifts: In: 4805 [P.O.:360; I.V.:3395; IV Piggyback:1050] Out: 3700 [Urine:3700]  PHYSICAL EXAMINATION: General: Alert,  NAD Neuro: AAO to person and place, follows commands, moves all extremities; no focal deficits HEENT: PERRLA, oral mucosa moist Cardiovascular: RRR, S1/S2, no MRG, no edema, +2 pules Lungs: Normal WOB, bilateral airflow Abdomen:  Soft, NT, ND, normal bowel sounds Musculoskeletal:  +ROM, no deformities, gait is unsteady Skin:  Warm and no rash  LABS:  BMET  Recent  Labs Lab 01/29/16 1505 01/30/16 0522 01/31/16 0155  NA 139 140 138  K 4.2 3.6 3.0*  CL 106 106 110  CO2 23 22 20*  BUN CREATININE 0.66 0.67 0.50*  GLUCOSE 111* 106* 113*    Electrolytes  Recent Labs Lab 01/29/16 1505 01/30/16 0522 01/31/16 0155  CALCIUM 8.8* 8.5* 7.4*  MG 2.1 2.0 1.6*  PHOS 4.2 3.5 2.3*    CBC  Recent Labs Lab 01/27/16 0127 01/28/16 0453 01/29/16 0422  WBC 8.5 9.7 9.8  HGB 9.7* 11.0* 10.6*  HCT 29.2* 33.2* 31.0*  PLT 207 277 275    Coag's No results for input(s): APTT, INR in the last 168 hours.  Sepsis Markers No results for input(s): LATICACIDVEN, PROCALCITON, O2SATVEN in the last 168 hours.  ABG  Recent Labs Lab 01/27/16 0409  PHART 7.52*  PCO2ART 38  PO2ART 50*    Liver Enzymes No results for input(s): AST, ALT, ALKPHOS, BILITOT, ALBUMIN in the last 168 hours.  Cardiac Enzymes No results for input(s): TROPONINI, PROBNP in the last 168 hours.  Glucose  Recent Labs Lab 01/28/16 2029 01/29/16 0904 01/29/16 2120 01/30/16 0830 01/30/16 2232 01/31/16 0839  GLUCAP 106* 108* 83 106* 102* 114*   Imaging -No CXR 02/08  STUDIES:  2-D echo 01/29>report pending  CULTURES: 01/31 Sputum with gram negative rods, gram positive cocci; Final report-E-coli Urine culture-NTD Blood NTD  ANTIBIOTICS: Unasyn 01/27>  SIGNIFICANT EVENTS: 01/27>Found down>PEA arrest>Intubated>admitted 02/01>Extubated  LINES/TUBES: 01/25/16 Left PICC line  ASSESSMENT / PLAN:  PULMONARY A: Acute  respiratory failure secondary to drug overdose/polysubstance abuse-resolved Aspiration pneumonia-afebrile and improved CXR P:    -Supplemental O2 Lucas prn to keep SPO2>92%  -Pulmonary hygiene -prn CXR  CARDIOVASCULAR A:  S/p Cardiopulmonary arrest Transient V-Tach-likely due to electrolyte imbalances; no arrhythmia history at baseline P:  -Telemetry -Hemodynamic monitoring per ICU protocol  RENAL A:    Hypokalemia-resolved Metabolic acidosis-Resolved P:   -BMP daily -Decrease D5/RL with KCL AT ML/HR  HEMATOLOGIC A:   Anemia-H/H stable P:  Monitor CBC  INFECTIOUS A:   Aspiration pneumonia-improved P:   -off antibiotics -Monitor and repeat cultures if febrile  NEUROLOGIC A:   Acute metabolic encephalopathy secondary to polysubstance abuse/OD Acute ETOH withdrawal/Delirium tremens Decondition/generalized weakness 2/2 acute illness P:   RASS goal:  Careers information officer for safety -d/c Precedex gtt  -CIWA protocol -d/c Valium  -Continue scheduled Ativan -Continue librium 25 mg tid -Continue Seroquel 100 mg QHS -PT/OT as tolerated  FAMILY  - Updates: No family available 02/02/16   Best Practice: Code Status:  Full. Diet: Clear liquids GI prophylaxis: n/a VTE prophylaxis:  SCD's / heparin.  Transfer to med-surg if oj with Dr Sherryll Burger.  PCCM will sign-off; recall if necessary.  Total CC time 30 minutes  Magdalene S. [facility] ANP-BC Pulmonary and Critical Care Medicine [facility] [phone]  PCCM ATTENDING ATTESTATION:  I have evaluated patient with ANP [name], reviewed database in its entirety and discussed care plan in detail. In addition, this patient was discussed on multidisciplinary rounds.   Important exam findings: More appropriate today No distress, no new complaints Getting stronger - ambulating in ICU/SDU  Major problems addressed by PCCM team: Agitated delirium Alcohol withdrawal syndrome Severe deconditioning  PLAN/REC: Appears ready for transfer to med floor Cont librium Cont PT/OT If Dr Sherryll Burger agrees and pt is transferred, PCCM will sign off. Please call if we can be of further assistance  Billy Fischer, MD PCCM service Mobile [phone] Pager [phone]

## 2016-02-02 NOTE — Progress Notes (Signed)
Pt is alert but confused increasingly agitated and verbaly abused.attempted to get out of bed.PRN meds given as ordered.VSS. Sitter at bed side.Resting in bed now. Will continue to observe closely.

## 2016-02-03 DIAGNOSIS — F05 Delirium due to known physiological condition: Secondary | ICD-10-CM

## 2016-02-03 LAB — GLUCOSE, CAPILLARY
Glucose-Capillary: 107 mg/dL — ABNORMAL HIGH (ref 65–99)
Glucose-Capillary: 96 mg/dL (ref 65–99)
Glucose-Capillary: 91 mg/dL (ref 65–99)

## 2016-02-03 MED ORDER — ZIPRASIDONE MESYLATE 20 MG IM SOLR
20.0000 mg | Freq: Two times a day (BID) | INTRAMUSCULAR | Status: DC | PRN
  Administered 2016-02-03 – 2016-02-05 (×3): 20 mg via INTRAMUSCULAR
  Filled 2016-02-03 (×5): qty 20

## 2016-02-03 MED ORDER — ADULT MULTIVITAMIN W/MINERALS CH
1.0000 | ORAL_TABLET | Freq: Every day | ORAL | Status: DC
Start: 2016-02-03 — End: 2016-02-07
  Administered 2016-02-04 – 2016-02-07 (×4): 1 via ORAL
  Filled 2016-02-03 (×4): qty 1

## 2016-02-03 MED ORDER — LORAZEPAM 1 MG PO TABS: 1.0000 mg | ORAL_TABLET | Freq: Four times a day (QID) | ORAL | Status: DC | PRN

## 2016-02-03 MED ORDER — VITAMIN B-1 100 MG PO TABS
100.0000 mg | ORAL_TABLET | Freq: Every day | ORAL | Status: DC
Start: 2016-02-03 — End: 2016-02-07
  Administered 2016-02-05 – 2016-02-07 (×3): 100 mg via ORAL
  Filled 2016-02-03 (×4): qty 1

## 2016-02-03 MED ORDER — ZIPRASIDONE MESYLATE 20 MG IM SOLR
10.0000 mg | INTRAMUSCULAR | Status: DC | PRN
  Administered 2016-02-03: 10 mg via INTRAMUSCULAR
  Filled 2016-02-03 (×2): qty 20

## 2016-02-03 MED ORDER — THIAMINE HCL 100 MG/ML IJ SOLN
100.0000 mg | Freq: Every day | INTRAMUSCULAR | Status: DC
  Administered 2016-02-04: 100 mg via INTRAVENOUS
  Filled 2016-02-03: qty 2

## 2016-02-03 MED ORDER — FOLIC ACID 1 MG PO TABS: 1.0000 mg | ORAL_TABLET | Freq: Every day | ORAL | Status: DC

## 2016-02-03 MED ORDER — ZIPRASIDONE MESYLATE 20 MG IM SOLR
10.0000 mg | Freq: Once | INTRAMUSCULAR | Status: AC
  Administered 2016-02-03: 10 mg via INTRAMUSCULAR
  Filled 2016-02-03: qty 20

## 2016-02-03 MED ORDER — LORAZEPAM 2 MG/ML IJ SOLN
1.0000 mg | INTRAMUSCULAR | Status: DC | PRN
  Administered 2016-02-03 – 2016-02-04 (×2): 2 mg via INTRAVENOUS
  Filled 2016-02-03 (×2): qty 1

## 2016-02-03 MED ORDER — LORAZEPAM 2 MG/ML IJ SOLN
1.0000 mg | Freq: Four times a day (QID) | INTRAMUSCULAR | Status: DC | PRN
  Administered 2016-02-03: 1 mg via INTRAVENOUS
  Filled 2016-02-03: qty 1

## 2016-02-03 NOTE — Progress Notes (Signed)
Spoke with Dr Glynis Smiles regarding pts high glucose level and iv fluids of D5LR. Orders received.

## 2016-02-03 NOTE — Progress Notes (Signed)
Pt has been very agitated this morning AEB his combative behavior, refusing help with ambulation, making gestures to the sitter and nurse that are verbally and physically offensive. Medication used when indicated but did not seem to ease symptoms. Code 300 called Dr Glynis Smiles called orders received. New medication given patient is now resting comfortably.

## 2016-02-03 NOTE — Progress Notes (Signed)
Patient combative and agitated. Threw himself to the floor and using profanity. Producer, television/film/video at bedside. Notified Md Dr. Renaldo Fiddler and made him aware of situation. Geodon  ordered. Tried contacting family members. Son called back and was going to try to contact his aunts to come up to the unit.

## 2016-02-03 NOTE — Evaluation (Signed)
Physical Therapy Re-evaluation Patient Details Name: Duane Hayes MRN: [id number] DOB: 1961-05-04 Today's Date: 02/03/2016   History of Present Illness  Patient is a 55 y/o male that presents after apparent suicidal attempt with cocktail of illicit drugs. He coded on the way to [facility] and was resucitated with CPR. Patient was intubated for several days and has been going through DTs now that he is more alert. Pt is very lethargic at time of PT evaluation with slurred speech that at times is incoherent. Does not always answer questions and unclear if answers are reliable.  Clinical Impression  Pt is very lethargic and drowsy during PT re-evaluation. He is slurring his speech and closing his eyes frequently. Overall history from pt felt to be very unreliable. Pt with very poor safety awareness during session. He falls frequently into therapist, walls, and counters. During ambulation pt had at least 20 episodes where he would have fallen if he had not been supported by 2 therapists. However he is able to walk approximately 200' and his legs appear strong. Deficits noted in pt appear to be more related to cognition than pure deficits in balance. It is also highly likely that medication are heavily impacting pt as he was aggressive and agitated this AM and was given Ativan per RN. Pt is a very high fall risk at discharge and will need to use a rolling walker with continual guarding by family/friends whenever he is ambulating. He does not present with deficits that would be modified/corrected at SNF. Pt would benefit from [facility] PT for safety, judgement, and balance training. Pt will benefit from skilled PT services to address deficits in strength, balance, and mobility in order to return to full function at home.     Follow Up Recommendations Home health PT;Supervision/Assistance - 24 hour (Pt needs 24/7 supervision due to HIGH fall risk)    Equipment Recommendations  Rolling walker with 5" wheels (Unclear if  pt has at home, will need at discharge)    Recommendations for Other Services       Precautions / Restrictions Precautions Precautions: Fall Restrictions Weight Bearing Restrictions: No      Mobility  Bed Mobility Overal bed mobility: Needs Assistance Bed Mobility: Sit to Supine     Supine to sit: Supervision Sit to supine: Supervision   General bed mobility comments: Pt able to return to bed without cues or assistance. He appears to have poor safety awareness and gets dangerously close to EOB. Pt with poor safety awareness but no notable deficits in strength which would limit bed mobility  Transfers Overall transfer level: Needs assistance Equipment used: Rolling walker (2 wheeled) Transfers: Sit to/from Stand Sit to Stand: Min assist         General transfer comment: Pt is very unsteady during transfers. He frequently falls to the side requiring +2 present for safety to prevent falls. Pt with slowed reaction time and poor safety awareness  Ambulation/Gait Ambulation/Gait assistance: Mod assist Ambulation Distance (Feet): 200 Feet Assistive device: Rolling walker (2 wheeled) Gait Pattern/deviations: Step-through pattern;Decreased step length - right;Decreased step length - left;Scissoring;Staggering left;Staggering right Gait velocity: Decreased Gait velocity interpretation: <1.8 ft/sec, indicative of risk for recurrent falls General Gait Details: Patient's gait is extremely unsafe. Pt has very poor reaction time and safety awareness. Pt is very drowsy and stumbles frequently. Steps frequently cross midline and pt falls into therapist, walls, and counters. Pt with poor safety awareness and unable to correct for LOB with rolling walker. Suspect much of  lethargy and stumbling is due to medications. Per RN pt was agitated and agressive this morning and was given Ativan.  Stairs            Wheelchair Mobility    Modified Rankin (Stroke Patients Only)        Balance Overall balance assessment: Needs assistance Sitting-balance support: No upper extremity supported Sitting balance-Leahy Scale: Fair     Standing balance support: Bilateral upper extremity supported Standing balance-Leahy Scale: Poor Standing balance comment: Pt unable to remain standing in narrow stance without UE support. Pt is falling in both directions with poor reaction time and safety awareness.                             Pertinent Vitals/Pain Pain Assessment: Faces Faces Pain Scale: No hurt Pain Location: Pt denies pain but reports intermittent L knee weakness for which he believes he needs a brace    Home Living Family/patient expects to be discharged to:: Private residence Living Arrangements: Spouse/significant other Available Help at Discharge: Other (Comment) (Unclear) Type of Home: Mobile home Home Access: Ramped entrance     Home Layout: One level Home Equipment: Walker - 2 wheels      Prior Function Level of Independence: Independent         Comments: Patient was not a terribly reliable historian and did not provide adequate information on past mobility history. At times it sounds like he was using a rolling walker prior to admission but again this is unclear     Hand Dominance   Dominant Hand: Right    Extremity/Trunk Assessment   Upper Extremity Assessment: Overall WFL for tasks assessed           Lower Extremity Assessment: Overall WFL for tasks assessed;LLE deficits/detail   LLE Deficits / Details: No focal weakness identified in bilateral LEs. Grossly 4+/5 throughout. Pt reports some LLE numbness to light touch sensation testing but then reports full sensation. Pt responses felt to be unreliable     Communication   Communication: Other (comment) (Lethargic with slurred speech, suspect medications)  Cognition Arousal/Alertness: Lethargic;Suspect due to medications Behavior During Therapy: Flat affect;Impulsive  (Lethargic and drowsy. Stumbling on his feet) Overall Cognitive Status: No family/caregiver present to determine baseline cognitive functioning                      General Comments      Exercises        Assessment/Plan    PT Assessment Patient needs continued PT services  PT Diagnosis Difficulty walking;Generalized weakness;Abnormality of gait   PT Problem List Decreased strength;Decreased knowledge of use of DME;Decreased safety awareness;Decreased activity tolerance;Decreased balance;Decreased mobility;Decreased cognition  PT Treatment Interventions DME instruction;Gait training;Stair training;Therapeutic activities;Therapeutic exercise;Balance training;Neuromuscular re-education;Cognitive remediation;Patient/family education   PT Goals (Current goals can be found in the Care Plan section) Acute Rehab PT Goals Patient Stated Goal: Pt unable to participate in goal setting at this time PT Goal Formulation: Patient unable to participate in goal setting    Frequency Min 2X/week   Barriers to discharge Decreased caregiver support Unclear how much support pt will have at home    Co-evaluation               End of Session Equipment Utilized During Treatment: Gait belt Activity Tolerance: Patient limited by fatigue;Patient limited by lethargy Patient left: in bed;with call bell/phone within reach;with nursing/sitter in room (CNA present with patient)  Nurse Communication: Mobility status (Comments regarding prior suicide attempt)         Time: 0910-0930 PT Time Calculation (min) (ACUTE ONLY): 20 min   Charges:   PT Evaluation $PT Re-evaluation: 1 Procedure PT Treatments $Gait Training: 8-22 mins   PT G Codes:       Sharalyn Ink Teauna Dubach PT, DPT   Rosanna Bickle 02/03/2016, 10:54 AM

## 2016-02-03 NOTE — Progress Notes (Signed)
Nutrition Follow-up      INTERVENTION:  Meals and snacks: Cater to pt preferences Medical Nutrition Supplement Therapy: Continue ensure for added nutrition   NUTRITION DIAGNOSIS:   Inadequate oral intake related to acute illness as evidenced by NPO status improved as on solid foods    GOAL:   Provide needs based on ASPEN/SCCM guidelines    MONITOR:    (Energy Intake, Anthropometrics, Electrolyte/Renal Profile, Digestive System, Pulmonary)  REASON FOR ASSESSMENT:   Consult Enteral/tube feeding initiation and management  ASSESSMENT:      Events of this pm noted.  Chart reviewed   Current Nutrition: noted ate bites at breakfast this am, note additional documented meal of 50% eaten   Gastrointestinal Profile: Last BM: 2/7   Scheduled Medications:  . docusate sodium  100 mg Oral BID  . enoxaparin (LOVENOX) injection  40 mg Subcutaneous Q24H  . feeding supplement (ENSURE ENLIVE)  237 mL Oral BID BM  . folic acid  1 mg Oral Daily  . sodium chloride flush  3 mL Intravenous Q12H  . thiamine  100 mg Oral Daily      Electrolyte/Renal Profile and Glucose Profile:   Recent Labs Lab 01/31/16 0155 01/31/16 1544 02/01/16 0432 02/01/16 0513 02/02/16 0509  NA 138 136 139 137 137  K 3.0* 3.4* 5.8* 4.6 4.8  CL 110 107 118* 109 109  CO2 20* 22 18* 22 21*  BUN CREATININE 0.50* 0.61 0.50* 0.74 0.67  CALCIUM 7.4* 8.2* 6.9* 8.6* 8.1*  MG 1.6* 2.3 1.7  --  1.8  PHOS 2.3*  --  2.5  --  4.1  GLUCOSE 113* 123* 535* 146* 319*      Weight Trend since Admission: Filed Weights   01/29/16 0500 01/30/16 0500 01/31/16 0403  Weight: 149 lb 7.6 oz (67.8 kg) 133 lb 6.1 oz (60.5 kg) 135 lb 5.8 oz (61.4 kg)      Diet Order:  DIET DYS 3 Room service appropriate?: Yes with Assist; Fluid consistency:: Thin  Skin:   reviewed   Height:   Ht Readings from Last 1 Encounters:  01/21/16  (1.727 m)    Weight:   Wt Readings from Last 1 Encounters:   01/31/16 135 lb 5.8 oz (61.4 kg)    Ideal Body Weight:     BMI:  Body mass index is 20.59 kg/(m^2).  Estimated Nutritional Needs:   Kcal:  1895 kcals (Ve: 8, Tmax: 38.4) using wt of 69.7 kg  Protein:  83-138 g (1.2-2.0 g/kg)   Fluid:  1725-2070 ml (25-30 ml/kg)   EDUCATION NEEDS:   No education needs identified at this time  MODERATE Care Level  Charissa Knowles B. Freida Busman, RD, LDN [phone] (pager) Weekend/On-Call pager [phone])

## 2016-02-03 NOTE — Progress Notes (Addendum)
[facility] - [place] at [facility]   PATIENT NAME: Duane Hayes    MR#:  [id number]  DATE OF BIRTH:  23-Nov-1961  SUBJECTIVE:  CHIEF COMPLAINT:   Chief Complaint  Patient presents with  . Drug Overdose   Has been asleep since early morning but per nursing report : he was trying to get out of bed, refusing to sit down, very unsteady on feet, legs giving out, very agitated, physically and verbally assaulting staff requiring Medication which helped decreased agitation, 1:1 sitter remains in place.  REVIEW OF SYSTEMS:  Review of Systems  Unable to perform ROS: mental acuity   DRUG ALLERGIES:  No Known Allergies  VITALS:  Blood pressure 124/89, pulse 104, temperature 97.8 F (36.6 C), temperature source Oral, resp. rate 18, height  (1.727 m), weight 61.4 kg (135 lb 5.8 oz), SpO2 100 %.  PHYSICAL EXAMINATION:  Physical Exam  GENERAL:  55 y.o.-year-old patient sleeping comfortably EYES: Pupils equal, round, reactive to light and accommodation. No scleral icterus. Extraocular muscles intact.  HEENT: Head atraumatic, normocephalic. Oropharynx and nasopharynx clear.  NECK:  Supple, no jugular venous distention. No thyroid enlargement, no tenderness.  LUNGS: Normal breath sounds bilaterally, no wheezing, rales,rhonchi or crepitation. No use of accessory muscles of respiration. Decreased bibasilar breath sounds. CARDIOVASCULAR: S1, S2 normal. No murmurs, rubs, or gallops.  ABDOMEN: Soft, nontender, nondistended. Bowel sounds present. No organomegaly or mass.  EXTREMITIES: No pedal edema, cyanosis, or clubbing.  NEUROLOGIC: sleeping PSYCHIATRIC: The patient is sleeping comfortably SKIN: No obvious rash, lesion, or ulcer.  LABORATORY PANEL:   CBC  Recent Labs Lab 02/01/16 0432  WBC 11.5*  HGB 11.5*  HCT 33.9*  PLT 426    ------------------------------------------------------------------------------------------------------------------  Chemistries   Recent Labs Lab 02/02/16 0509  NA 137  K 4.8  CL 109  CO2 21*  GLUCOSE 319*  BUN 7  CREATININE 0.67  CALCIUM 8.1*  MG 1.8    ASSESSMENT AND PLAN:   55 year old male was noted to be unconscious in a motel room. He was intubated for airway protection. Possible overdose suspected  1.Acute respiratory failure : due to Drug Overdose and alcohol intoxication; And aspiration pneumonia - resolved. Off abx   2.Cardiac arrest with hypothermia/very brief CPR; hypothermia and bradycardia are resolved.  3. Hypokalemia- replaced and resolved  4.Drug overdose-possible suicidal attempt; continue sitter.  -Psych consult requested. Needs to be under committment - Urine tox when admitted was positive for amphetamines, cocaine, marijuana and tricyclic antidepressants.  5.Delirium tremens-significantly delirious, intoxicated with alcohol and presented. Significant use of alcohol use and also drug abuse. -Precedex drip off. Significant DTs- visual hallucinations -on Ativan and Haldol when necessary-  - continue klonopin & librium- needs to be under commitment  6.h/o ETOH abuse; continue Thiamine and folic acid,  -On Ativan, haldol for withdrawals- librium added  7. Stage 2 Lt ischial tuberosity pressure ulcer: noted by nurse, present on admission. Dressing applied.   Now he is awake, while I'm typing note so going back to see him as he would like to talk.   All the records are reviewed and case discussed with Care Management/Social Worker. Management plans discussed with the patient, family and they are in agreement.  CODE STATUS: Full Code  TOTAL TIME SPENT IN TAKING CARE OF THIS PATIENT: 35 minutes.   POSSIBLE D/C IN 1-2 days, DEPENDING ON CLINICAL CONDITION and PSYCH  eval   [facility], Janna Oak M.D on 02/03/2016 at 8:22 AM  Between 7am to 6pm -  Pager -  [phone]  After 6pm go to [email or web address] - password [email or web address] [facility]  [place] [facility]  Office  [phone]  CC: Primary care physician; No PCP Per Patient

## 2016-02-03 NOTE — Progress Notes (Signed)
ST received order, NSG consulted, chart reviewed. NSG stated that pt was "fine on current diet of dysphagia 3 with thin liquids." Stated that she didn't feel that pt had any difficulty with current diet; pt agreed verbally with nursing.  Pt alert and conversant, sitter present secondary due to AMS/agitation. Pt requesting "shower" and not interested in consuming breakfast at this time. NSG administered meds whole with thins without s/s of aspiration. ST available to consult if any changes of status are noted during admission. General aspiration precautions indicated with any POs. Nursing agreeable.

## 2016-02-03 NOTE — Progress Notes (Signed)
Edith out of bed, refusing to sit down, very unsteady on feet, legs giving out, very agitated, physically and verbally assaulting staff. Security Alert called. Medication given. Laray's agitation decreased, returned to bed. 1:1 sitter remains in place. Nursing continues to monitor.

## 2016-02-03 NOTE — Progress Notes (Signed)
Patient sister(s) in patients room. They confirmed they are not available to take patient home or care for patient as he needs 24/7 care currently. I also spoke with patients son and let him know current status. Pt continues to be belligerent and abusive to staff. Medical and Psych are aware of how difficult and challenging patient has been.

## 2016-02-03 NOTE — Consult Note (Signed)
[facility] Face-to-Face Psychiatry Consult   Reason for Consult:  Consult for this 55 year old man with a history of substance abuse who has been in the critical care unit for a little more than a week now becoming more agitated. Referring Physician:  Manuella Ghazi Patient Identification: Duane Hayes MRN:  [id number] Principal Diagnosis: Subacute delirium Diagnosis:   Patient Active Problem List   Diagnosis Date Noted  . Subacute delirium [F05] 02/03/2016  . 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 Altered mental status [R41.82]   . Pressure ulcer [L89.90] 01/25/2016  . Respiratory failure with hypoxia [name] [J96.91] 01/21/2016    Total Time spent with patient: 1 hour  Subjective:   Duane Hayes is a 55 y.o. male patient admitted with "I'm pretty frustrated".  HPI:  Patient interviewed. Chart reviewed. Old notes reviewed. Discussed case with case Occupational hygienist on the unit. Discussed with nursing reviewed labs. 55 year old man came into the hospital on January 27 after being found unconscious and almost dead in a motel room covered with ice. He was in critical care unit for several days. He was extubated but was agitated and was on a Precedex drip for several days. Now moved to the regular ward he is continuing to be agitated in fact looks like he is a little worse for the last day. On interview today the patient was fairly appropriate for most of it. He tells me that he is frustrated being in the hospital because he doesn't like people putting their hands on him and telling him what he can do. He thinks he can get up out of bed and move around on his own even though he admits that he has been somewhat unsteady on his feet. Patient does not recall coming into the hospital but does recall that he was having a big party that night and that he was doing heroin. He doesn't recall what other drugs he was doing. He is not particularly surprised at the  fact that he was found covered with ice. He assumes that the people he was partying with notice that he was getting hyperthermic and so they report ice on him and called 911. Patient denies that he's been having any symptoms of depression. Currently however he describes his mood is irritable and frustrated. He denies being aware of any psychiatric problems prior to hospitalization. He completely denies that there was any suicidal intent or plan involved in what brought him into the hospital and denies any suicidal or homicidal ideation now. He does tell me that he's been having visual hallucinations for the last day and his insight about them is work. He claims that his bed here in the hospital is covered with bedbugs which is obviously not true. He has been getting multiple doses of benzodiazepines and antipsychotics to control his agitation.  Social history: Patient tells me that he lives in a house with his ex-wife. He remembers her name being Duane Hayes, but cannot remember a phone number for her. The only phone number I could find in the chart was of his sister but when I called that number I was not able to leave a message because the mailbox was full  Substance abuse history: Patient says he has had a long history of abuse of marijuana and multiple other drugs. Says he's been in substance abuse treatment and past having spent 6 months at [facility]. He says that at this point he would like to stop using  drugs because he knows that it's going to kill him.  Medical history: Patient denies being aware of having any medical problems. It looks like his blood sugar has now been found to be consistently elevated not sure how all of that is going to handout is a long-term problem.  Past Psychiatric History: Patient denies that he was being treated for any mental health problems. He said that he had 1 suicide attempt 30 or 40 years ago by cutting his wrist and he was in a psychiatric hospital at that time.  He doesn't know any other details. Doesn't report any other suicidal behavior since then and claims to have no suicidal thoughts at all now.  Risk to Self: Is patient at risk for suicide?: Yes Risk to Others:   Prior Inpatient Therapy:   Prior Outpatient Therapy:    Past Medical History:  Past Medical History  Diagnosis Date  . Arthritis     Past Surgical History  Procedure Laterality Date  . Shoulder surgery     Family History: History reviewed. No pertinent family history. Family Psychiatric  History: Patient states that both his mother and father "didn't understand life too well". I couldn't get him to be any more specific than that. He says he's the only person in his family with a drug problem. Social History:  History  Alcohol Use  . Yes     History  Drug Use  . Yes    Social History   Social History  . Marital Status: Widowed    Spouse Name: N/A  . Number of Children: N/A  . Years of Education: N/A   Social History Main Topics  . Smoking status: Current Every Day Smoker    Types: Cigarettes  . Smokeless tobacco: None  . Alcohol Use: Yes  . Drug Use: Yes  . Sexual Activity: Not Asked   Other Topics Concern  . None   Social History Narrative   Additional Social History:    Allergies:  No Known Allergies  Labs:  Results for orders placed or performed during the hospital encounter of 01/21/16 (from the past 48 hour(s))  Basic metabolic panel     Status: Abnormal   Collection Time: 02/02/16  5:09 AM  Result Value Ref Range   Sodium 137 135 - 145 mmol/L   Potassium 4.8 3.5 - 5.1 mmol/L   Chloride 109 101 - 111 mmol/L   CO2 21 (L) 22 - 32 mmol/L   Glucose, Bld 319 (H) 65 - 99 mg/dL   BUN 7 6 - 20 mg/dL   Creatinine, Ser 0.67 0.61 - 1.24 mg/dL   Calcium 8.1 (L) 8.9 - 10.3 mg/dL   GFR calc non Af Amer >60 >60 mL/min   GFR calc Af Amer >60 >60 mL/min    Comment: (NOTE) The eGFR has been calculated using the CKD EPI equation. This calculation has not  been validated in all clinical situations. eGFR's persistently <60 mL/min signify possible Chronic Kidney Disease.    Anion gap 7 5 - 15  Magnesium     Status: None   Collection Time: 02/02/16  5:09 AM  Result Value Ref Range   Magnesium 1.8 1.7 - 2.4 mg/dL  Phosphorus     Status: None   Collection Time: 02/02/16  5:09 AM  Result Value Ref Range   Phosphorus 4.1 2.5 - 4.6 mg/dL  Glucose, capillary     Status: Abnormal   Collection Time: 02/03/16 12:02 PM  Result Value Ref Range  Glucose-Capillary 107 (H) 65 - 99 mg/dL   Comment 1 Notify RN     Current Facility-Administered Medications  Medication Dose Route Frequency Provider Last Rate Last Dose  . acetaminophen (TYLENOL) tablet 650 mg  650 mg Oral Q6H PRN Harrie Foreman, MD   650 mg at 01/23/16 2202  . docusate sodium (COLACE) capsule 100 mg  100 mg Oral BID Gladstone Lighter, MD   100 mg at 02/03/16 0836  . enoxaparin (LOVENOX) injection 40 mg  40 mg Subcutaneous Q24H Wilhelmina Mcardle, MD   40 mg at 02/03/16 [phone]  . feeding supplement (ENSURE ENLIVE) (ENSURE ENLIVE) liquid 237 mL  237 mL Oral BID BM Gladstone Lighter, MD   237 mL at 01/31/16 1656  . folic acid (FOLVITE) tablet 1 mg  1 mg Oral Daily Charlett Nose, RPH   1 mg at 02/03/16 8676  . haloperidol lactate (HALDOL) injection 2 mg  2 mg Intravenous Q4H PRN Wilhelmina Mcardle, MD   2 mg at 02/03/16 1034  . nicotine polacrilex (NICORETTE) gum 2 mg  2 mg Oral PRN Hillary Bow, MD   2 mg at 02/02/16 2045  . ondansetron (ZOFRAN) tablet 4 mg  4 mg Oral Q6H PRN Harrie Foreman, MD       Or  . ondansetron [facility]) injection 4 mg  4 mg Intravenous Q6H PRN Harrie Foreman, MD      . polyethylene glycol (MIRALAX / GLYCOLAX) packet 17 g  17 g Oral Daily PRN Gladstone Lighter, MD      . sodium chloride flush (NS) 0.9 % injection 3 mL  3 mL Intravenous Q12H Harrie Foreman, MD   3 mL at 02/03/16 0936  . thiamine (VITAMIN B-1) tablet 100 mg  100 mg Oral Daily Charlett Nose,  RPH   100 mg at 02/03/16 1950    Musculoskeletal: Strength & Muscle Tone: decreased and atrophy Gait & Station: unsteady Patient leans: Front  Psychiatric Specialty Exam: Review of Systems  Constitutional: Positive for malaise/fatigue.  HENT: Negative.   Eyes: Negative.   Respiratory: Negative.   Cardiovascular: Negative.   Gastrointestinal: Negative.   Musculoskeletal: Negative.   Skin: Negative.   Neurological: Positive for weakness.  Psychiatric/Behavioral: Positive for hallucinations, memory loss and substance abuse. Negative for depression and suicidal ideas. The patient is nervous/anxious and has insomnia.     Blood pressure 138/92, pulse 104, temperature 97.8 F (36.6 C), temperature source Oral, resp. rate 18, height _0  (1.727 m), weight 61.4 kg (135 lb 5.8 oz), SpO2 100 %.Body mass index is 20.59 kg/(m^2).  General Appearance: Disheveled  Eye Contact::  Minimal  Speech:  Slow and Slurred  Volume:  Decreased  Mood:  Irritable  Affect:  Inappropriate  Thought Process:  Tangential  Orientation:  Other:  He knew he was in a hospital he was incorrect about the date. He did know the basics of his situation.  Thought Content:  Hallucinations: Visual  Suicidal Thoughts:  No  Homicidal Thoughts:  No  Memory:  Immediate;   Fair Recent;   Poor Remote;   Fair  Judgement:  Impaired  Insight:  Shallow  Psychomotor Activity:  Decreased  Concentration:  Poor  Recall:  [employer] of Knowledge:Fair  Language: Fair  Akathisia:  No  Handed:  Right  AIMS (if indicated):     Assets:  Housing Resilience  ADL's:  Impaired  Cognition: Impaired,  Mild  Sleep:      Treatment Plan  Summary: Medication management and Plan This is a 55 year old man who evidently has a pretty serious substance abuse problem. He claims that he was mainly using heroin and that would be supported by the fact that he responded to Narcan although his drug screen was negative. It's possible he could've  been using fentanyl or hydrocodone or something else that doesn't show up on the opiate screen. Looks like he was also using amphetamines and cocaine which would explain why he would've gotten hyperthermic. Currently the patient is delirious. He is able to have a very basic conversation and understands his situation but his mental status waxes and wanes and he clearly is overestimating his ability to take care of himself. On the other hand I do not think he is actively suicidal at all. I appreciate that for his own safety nursing wants him to stay in bed if possible and stay calm. On the other hand the patient has a good point that if he keeps getting doses of antipsychotics and benzodiazepines is going to keep him so sedated that he will be able to get up and move around. One solution would be to discharge him if there was a safe place to send him to an a responsible adult who would take care of him. For this reason I would strongly recommend social work try and get in touch with family or ex-wife or whoever can take him to see if that could be arranged. I tried to reason with him that if he could just calm down for the next day he would probably be feeling much better by tomorrow. I'm going to discontinue all the benzodiazepines as well as the IM Geodon but leave the IV Haldol for now. Hopefully he can be managed without adding a lot of extra medicine for right now. I note that somebody putting involuntary commitment order on the patient. This is actually incorrect. There is no paperwork filed on his chart. He is not under involuntary commitment and I will discontinue that order for now. I don't see any definite indication at the moment put him under IVC. His delirium is such that if he tries to walk out of here on his own without any appropriate assistance than he can be judged to be incapable of making decisions. I'm not going to add any other psychiatric medicine for now. I will continue to  follow-up.  Disposition: Patient does not meet criteria for psychiatric inpatient admission. Supportive therapy provided about ongoing stressors.  Alethia Berthold, MD 02/03/2016 1:01 PM

## 2016-02-03 NOTE — Progress Notes (Signed)
Clinical Child psychotherapist (CSW) received substance abuse consult. Per chart patient has been agitated today and security officers have been in the room and on the floor throughout the day. PT is recommending Home Health 24/7 supervision. Per Dr. Toni Amend patient is not appropriate for inpatient psych or IVC. CSW will attempt to meet with patient when he is calm and more oriented. CSW will continue to follow and assist as needed.   Jetta Lout, LCSW [phone]

## 2016-02-03 NOTE — Consult Note (Signed)
  Psychiatry: Follow-up for this patient seen shortly ago. Although I just left him a half an hour or less ago I just got a call from the nursing station and that he has thrown himself on the floor and is combative again. I had hoped that he may be able to be redirected and settle down without medication but if there is an issue of dangerousness I've restarted the Geodon 10 mg again intramuscular now and then 20 every 12 hours as needed for agitation. I would still try to avoid benzodiazepines as they are unlikely to help as much as the IV Haldol and the Geodon. Unfortunate situation. Until we can find a responsible adult to take care of him it's hard to imagine discharging him until he gets much less agitated and confused. I will continue to follow.

## 2016-02-04 LAB — GLUCOSE, CAPILLARY
Glucose-Capillary: 99 mg/dL (ref 65–99)
Glucose-Capillary: 117 mg/dL — ABNORMAL HIGH (ref 65–99)

## 2016-02-04 MED ORDER — LORAZEPAM 2 MG/ML IJ SOLN: 3.0000 mg | INTRAMUSCULAR | Status: DC | PRN

## 2016-02-04 MED ORDER — ZIPRASIDONE MESYLATE 20 MG IM SOLR
10.0000 mg | Freq: Once | INTRAMUSCULAR | Status: AC
  Administered 2016-02-04: 10 mg via INTRAMUSCULAR
  Filled 2016-02-04: qty 20

## 2016-02-04 MED ORDER — LORAZEPAM 2 MG/ML IJ SOLN
4.0000 mg | INTRAMUSCULAR | Status: DC | PRN
  Administered 2016-02-04 – 2016-02-06 (×9): 4 mg via INTRAVENOUS
  Filled 2016-02-04 (×10): qty 2

## 2016-02-04 MED ORDER — ZIPRASIDONE MESYLATE 20 MG IM SOLR
10.0000 mg | INTRAMUSCULAR | Status: AC
  Administered 2016-02-04: 10 mg via INTRAMUSCULAR
  Filled 2016-02-04: qty 20

## 2016-02-04 MED ORDER — LORAZEPAM 1 MG PO TABS: 1.0000 mg | ORAL_TABLET | Freq: Four times a day (QID) | ORAL | Status: DC | PRN

## 2016-02-04 MED ORDER — LORAZEPAM 1 MG PO TABS
1.0000 mg | ORAL_TABLET | Freq: Four times a day (QID) | ORAL | Status: DC | PRN
Start: 2016-02-04 — End: 2016-02-06

## 2016-02-04 NOTE — Care Management (Signed)
Met with patient; sitter at bedside. He stated that he has 5 children- two from the same woman- and 3 that "he claims". He states "you've got to have hope". He states that he "wants to be tested for HIV because his fiance died 3 months ago had HIV- Duane Hayes". He agrees to "going somewhere for help but not just anywhere" regarding drug treatment. He does not want to stay in [place]- he mentioned [facility] but did not give reason. He doesn't have a plan. He doesn't have health insurance. I left applications to [facility] and [facility] but they will not help with drug treatment. His sister is Jackelyn Poling [phone] but he did not give me permission to talk to her. I offered Vocational Rehabilitation to help him get back into the job force- he was appreciative. Discussed with CSW.

## 2016-02-04 NOTE — Consult Note (Signed)
[facility] Face-to-Face Psychiatry Consult   Reason for Consult:  Consult for this 55 year old man with a history of substance abuse who has been in the critical care unit for a little more than a week now becoming more agitated. Referring Physician:  Manuella Ghazi Patient Identification: Duane Hayes MRN:  [id number] Principal Diagnosis: Subacute delirium Diagnosis:   Patient Active Problem List   Diagnosis Date Noted  . Subacute delirium [F05] 02/03/2016  . 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 Altered mental status [R41.82]   . Pressure ulcer [L89.90] 01/25/2016  . Respiratory failure with hypoxia [name] [J96.91] 01/21/2016    Total Time spent with patient: 1 hour  Subjective:   Duane Hayes is a 55 y.o. male patient admitted with "I'm pretty frustrated".  HPI:  Patient interviewed. Chart reviewed. Old notes reviewed. Discussed case with case Occupational hygienist on the unit. Discussed with nursing reviewed labs. 55 year old man came into the hospital on January 27 after being found unconscious and almost dead in a motel room covered with ice. He was in critical care unit for several days. He was extubated but was agitated and was on a Precedex drip for several days. Now moved to the regular ward he is continuing to be agitated in fact looks like he is a little worse for the last day. On interview today the patient was fairly appropriate for most of it. He tells me that he is frustrated being in the hospital because he doesn't like people putting their hands on him and telling him what he can do. He thinks he can get up out of bed and move around on his own even though he admits that he has been somewhat unsteady on his feet. Patient does not recall coming into the hospital but does recall that he was having a big party that night and that he was doing heroin. He doesn't recall what other drugs he was doing. He is not particularly surprised at the  fact that he was found covered with ice. He assumes that the people he was partying with notice that he was getting hyperthermic and so they report ice on him and called 911. Patient denies that he's been having any symptoms of depression. Currently however he describes his mood is irritable and frustrated. He denies being aware of any psychiatric problems prior to hospitalization. He completely denies that there was any suicidal intent or plan involved in what brought him into the hospital and denies any suicidal or homicidal ideation now. He does tell me that he's been having visual hallucinations for the last day and his insight about them is work. He claims that his bed here in the hospital is covered with bedbugs which is obviously not true. He has been getting multiple doses of benzodiazepines and antipsychotics to control his agitation.  Social history: Patient tells me that he lives in a house with his ex-wife. He remembers her name being [name], but cannot remember a phone number for her. The only phone number I could find in the chart was of his sister but when I called that number I was not able to leave a message because the mailbox was full  Substance abuse history: Patient says he has had a long history of abuse of marijuana and multiple other drugs. Says he's been in substance abuse treatment and past having spent 6 months at [facility]. He says that at this point he would like to stop using  drugs because he knows that it's going to kill him.  Medical history: Patient denies being aware of having any medical problems. It looks like his blood sugar has now been found to be consistently elevated not sure how all of that is going to handout is a long-term problem.  Past Psychiatric History: Patient denies that he was being treated for any mental health problems. He said that he had 1 suicide attempt 30 or 40 years ago by cutting his wrist and he was in a psychiatric hospital at that time.  He doesn't know any other details. Doesn't report any other suicidal behavior since then and claims to have no suicidal thoughts at all now.  Dictated reevaluation as of Friday the 10th.: Patient seen. Chart reviewed. Reviewed situation with nursing staff. Nurses called me earlier today to let me know that the patient has now been actively endorsing suicidal ideation and in fact even strongly insinuating that he has a plan to "finish the job". On reevaluation today the patient is more alert and more appropriate although he still seems slightly confused. His affect is more dysphoric and down. He is claiming that he is looking forward to the future but is very vague in describing it. He is nonspecific about any suicidal ideation but the nurses have documented multiple instances of his making clearly suicidal statements. Family has also continued to express concern about suicidal behavior. Patient physically seems to be more stable although he had a fall this afternoon.  Risk to Self: Is patient at risk for suicide?: Yes Risk to Others:   Prior Inpatient Therapy:   Prior Outpatient Therapy:    Past Medical History:  Past Medical History  Diagnosis Date  . Arthritis     Past Surgical History  Procedure Laterality Date  . Shoulder surgery     Family History: History reviewed. No pertinent family history. Family Psychiatric  History: Patient states that both his mother and father "didn't understand life too well". I couldn't get him to be any more specific than that. He says he's the only person in his family with a drug problem. Social History:  History  Alcohol Use  . Yes     History  Drug Use  . Yes    Social History   Social History  . Marital Status: Widowed    Spouse Name: N/A  . Number of Children: N/A  . Years of Education: N/A   Social History Main Topics  . Smoking status: Current Every Day Smoker    Types: Cigarettes  . Smokeless tobacco: None  . Alcohol Use: Yes  .  Drug Use: Yes  . Sexual Activity: Not Asked   Other Topics Concern  . None   Social History Narrative   Additional Social History:    Allergies:  No Known Allergies  Labs:  Results for orders placed or performed during the hospital encounter of 01/21/16 (from the past 48 hour(s))  Glucose, capillary     Status: Abnormal   Collection Time: 02/03/16 12:02 PM  Result Value Ref Range   Glucose-Capillary 107 (H) 65 - 99 mg/dL   Comment 1 Notify RN   Glucose, capillary     Status: None   Collection Time: 02/03/16  4:07 PM  Result Value Ref Range   Glucose-Capillary 96 65 - 99 mg/dL  Glucose, capillary     Status: None   Collection Time: 02/03/16  8:54 PM  Result Value Ref Range   Glucose-Capillary 91 65 - 99 mg/dL  Current Facility-Administered Medications  Medication Dose Route Frequency Provider Last Rate Last Dose  . acetaminophen (TYLENOL) tablet 650 mg  650 mg Oral Q6H PRN Arnaldo Natal, MD   650 mg at 01/23/16 2202  . docusate sodium (COLACE) capsule 100 mg  100 mg Oral BID Enid Baas, MD   100 mg at 02/04/16 0900  . enoxaparin (LOVENOX) injection 40 mg  40 mg Subcutaneous Q24H Merwyn Katos, MD   40 mg at 02/04/16 0900  . feeding supplement (ENSURE ENLIVE) (ENSURE ENLIVE) liquid 237 mL  237 mL Oral BID BM Enid Baas, MD   237 mL at 02/04/16 0901  . folic acid (FOLVITE) tablet 1 mg  1 mg Oral Daily Bertram Savin, RPH   1 mg at 02/04/16 0900  . haloperidol lactate (HALDOL) injection 2 mg  2 mg Intravenous Q4H PRN Merwyn Katos, MD   2 mg at 02/03/16 1556  . LORazepam (ATIVAN) injection 4 mg  4 mg Intravenous Q3H PRN Milagros Loll, MD   4 mg at 02/04/16 1525   Or  . LORazepam (ATIVAN) tablet 1 mg  1 mg Oral Q6H PRN Milagros Loll, MD      . multivitamin with minerals tablet 1 tablet  1 tablet Oral Daily Delfino Lovett, MD   1 tablet at 02/04/16 0900  . nicotine polacrilex (NICORETTE) gum 2 mg  2 mg Oral PRN Milagros Loll, MD   2 mg at 02/03/16 1818  .  ondansetron (ZOFRAN) tablet 4 mg  4 mg Oral Q6H PRN Arnaldo Natal, MD       Or  . ondansetron [facility]) injection 4 mg  4 mg Intravenous Q6H PRN Arnaldo Natal, MD      . polyethylene glycol (MIRALAX / GLYCOLAX) packet 17 g  17 g Oral Daily PRN Enid Baas, MD      . sodium chloride flush (NS) 0.9 % injection 3 mL  3 mL Intravenous Q12H Arnaldo Natal, MD   3 mL at 02/04/16 0901  . thiamine (VITAMIN B-1) tablet 100 mg  100 mg Oral Daily Vipul Shah, MD   100 mg at 02/03/16 1742   Or  . thiamine (B-1) injection 100 mg  100 mg Intravenous Daily Vipul Shah, MD   100 mg at 02/04/16 0900  . ziprasidone (GEODON) injection 20 mg  20 mg Intramuscular Q12H PRN Audery Amel, MD   20 mg at 02/03/16 2159    Musculoskeletal: Strength & Muscle Tone: decreased and atrophy Gait & Station: unsteady Patient leans: Front  Psychiatric Specialty Exam: Review of Systems  Constitutional: Positive for malaise/fatigue.  HENT: Negative.   Eyes: Negative.   Respiratory: Negative.   Cardiovascular: Negative.   Gastrointestinal: Negative.   Musculoskeletal: Negative.   Skin: Negative.   Neurological: Positive for weakness.  Psychiatric/Behavioral: Positive for depression, suicidal ideas, memory loss and substance abuse. Negative for hallucinations. The patient is nervous/anxious and has insomnia.     Blood pressure 124/77, pulse 97, temperature 98.5 F (36.9 C), temperature source Oral, resp. rate 18, height  (1.727 m), weight 61.4 kg (135 lb 5.8 oz), SpO2 100 %.Body mass index is 20.59 kg/(m^2).  General Appearance: Disheveled  Eye Contact::  Minimal  Speech:  Slow and Slurred  Volume:  Decreased  Mood:  Irritable  Affect:  Inappropriate  Thought Process:  Tangential  Orientation:  Other:  He knew he was in a hospital he was incorrect about the date. He did know the basics of  his situation.  Thought Content:  Hallucinations: Visual  Suicidal Thoughts:  No  Homicidal Thoughts:  No   Memory:  Immediate;   Fair Recent;   Poor Remote;   Fair  Judgement:  Impaired  Insight:  Shallow  Psychomotor Activity:  Decreased  Concentration:  Poor  Recall:  [employer] of Knowledge:Fair  Language: Fair  Akathisia:  No  Handed:  Right  AIMS (if indicated):     Assets:  Housing Resilience  ADL's:  Impaired  Cognition: Impaired,  Mild  Sleep:      Treatment Plan Summary: Medication management and Plan Patient reevaluated. Yesterday I felt that he did not meet commitment criteria however on reconsideration there seems to be mounting evidence that the patient is making suicidal statements and hinting strongly that he was actually trying to harm himself before hospital treatment. Additionally it has become clear that nobody in his family is willing to take him home in his current condition. Furthermore on reevaluation today the patient does appear more sad and down and more open to treatment. He is a little bit more reasonable being able to discuss how he is frustrated with being locked up but knows that he needs help. First my previous decision and ordered involuntary commitment. I have filed drop her commitment papers and a custody order should be forthcoming. I have asked TTS to arrange transfer for the patient to psychiatry. I'm a little concerned about his having a fall today and so if process gets delayed until tomorrow that would be fine also. I'm still not ordering any psychiatric medicine yet. Further evaluation can occur down stairs. Also make sure that we have physical therapy ordered as the patient is strongly requesting more assistance in walking.  Disposition: Recommend psychiatric Inpatient admission when medically cleared. Supportive therapy provided about ongoing stressors.  Mordecai Rasmussen, MD 02/04/2016 3:26 PM

## 2016-02-04 NOTE — Progress Notes (Signed)
At 1400 pt was ambulated ,with CNA Delois present, to bathroom,  Pt was at sink washing his hands when pt swayed backward and pt sat on floor , no injuries pt did not loose conscious. Pt was assissted to standing positon and escorted back to bed by CNA Delois and Cindra Eves RN  , Paediatric nurse notified , Dr Sherryll Burger notified , pt family member in room (ex wife)

## 2016-02-04 NOTE — Progress Notes (Signed)
Paged and spoke to Dr. Toni Amend regarding patient's conversation with his sitter/NA expressing how he plans to "finish" what he did before so he could be with his deceased fiance. NA Stacy spoke to Dr. Toni Amend since he told her directly.

## 2016-02-04 NOTE — Progress Notes (Addendum)
Inpatient Diabetes Program Recommendations  AACE/ADA: New Consensus Statement on Inpatient Glycemic Control (2015)  Target Ranges:  Prepandial:   less than 140 mg/dL      Peak postprandial:   less than 180 mg/dL (1-2 hours)      Critically ill patients:  140 - 180 mg/dL   Review of Glycemic Control  Results for LYNK, MARTI (MRN [id number]) as of 02/04/2016 11:10  Ref. Range 01/30/2016 22:32 01/31/2016 08:39 02/03/2016 12:02 02/03/2016 16:07 02/03/2016 20:54  Glucose-Capillary Latest Ref Range: 65-99 mg/dL 578 (H) 469 (H) 629 (H) 96 91     Results for TORRE, SCHAUMBURG (MRN [id number]) as of 02/02/2016 15:39  Ref. Range 01/21/2016 09:31  Hemoglobin A1C Latest Ref Range: 4.0-6.0 % 5.6    Diabetes history: None  Outpatient Diabetes medications: None Current orders for Inpatient glycemic control: No medications ordered  Inpatient Diabetes Program Recommendations:    Note that lab glucose has been elevated on both 02/01/16 and 02/02/16. However repeat lab on 2/17 was 146 mg/dL. Please consider checking CBG's tid with meals and HS- If greater than 150 mg/dL, may consider adding Novolog sensitive correction.     Spoke with nursing tech this am regarding no documented blood sugar despite order for blood sugars tid and hs. She will check prior to lunch.    No recommendations based on most recent blood sugars.   Susette Racer, RN, BA, MHA, CDE Diabetes Coordinator Inpatient Diabetes Program  [phone] (Team Pager) [phone] [facility] Office) 02/04/2016 11:09 AM

## 2016-02-04 NOTE — Progress Notes (Signed)
Per Dr. Toni Amend patient will be transferred to [facility]. Clinical Social Worker (CSW) will continue to follow and assist as needed.   Jetta Lout, LCSW [phone]

## 2016-02-04 NOTE — Progress Notes (Signed)
[facility] - [place] at [facility]   PATIENT NAME: Duane Hayes    MR#:  [id number]  DATE OF BIRTH:  10/25/1961  SUBJECTIVE:  CHIEF COMPLAINT:   Chief Complaint  Patient presents with  . Drug Overdose   Was sleeping when I saw him, sitter at bedside. Fell mid afternoon but no injuries REVIEW OF SYSTEMS:  Review of Systems  Unable to perform ROS: mental acuity   DRUG ALLERGIES:  No Known Allergies  VITALS:  Blood pressure 124/77, pulse 97, temperature 98.5 F (36.9 C), temperature source Oral, resp. rate 18, height  (1.727 m), weight 61.4 kg (135 lb 5.8 oz), SpO2 100 %.  PHYSICAL EXAMINATION:  Physical Exam  GENERAL:  55 y.o.-year-old patient sleeping comfortably EYES: Pupils equal, round, reactive to light and accommodation. No scleral icterus. Extraocular muscles intact.  HEENT: Head atraumatic, normocephalic. Oropharynx and nasopharynx clear.  NECK:  Supple, no jugular venous distention. No thyroid enlargement, no tenderness.  LUNGS: Normal breath sounds bilaterally, no wheezing, rales,rhonchi or crepitation. No use of accessory muscles of respiration. Decreased bibasilar breath sounds. CARDIOVASCULAR: S1, S2 normal. No murmurs, rubs, or gallops.  ABDOMEN: Soft, nontender, nondistended. Bowel sounds present. No organomegaly or mass.  EXTREMITIES: No pedal edema, cyanosis, or clubbing.  NEUROLOGIC: sleeping PSYCHIATRIC: The patient is sleeping comfortably SKIN: No obvious rash, lesion, or ulcer.  LABORATORY PANEL:   CBC  Recent Labs Lab 02/01/16 0432  WBC 11.5*  HGB 11.5*  HCT 33.9*  PLT 426   ------------------------------------------------------------------------------------------------------------------  Chemistries   Recent Labs Lab 02/02/16 0509  NA 137  K 4.8  CL 109  CO2 21*  GLUCOSE 319*  BUN 7  CREATININE 0.67  CALCIUM 8.1*  MG 1.8    ASSESSMENT AND PLAN:   55 year old male was noted to be unconscious  in a motel room. He was intubated for airway protection. Possible overdose suspected  1.Acute respiratory failure : due to Drug Overdose and alcohol intoxication; And aspiration pneumonia - resolved. Off abx   2.Cardiac arrest with hypothermia/very brief CPR; hypothermia and bradycardia are resolved.  3. Hypokalemia- replaced and resolved  4.Drug overdose-possible suicidal attempt; continue sitter.  -Psych consult requested. Needs to be under committment - Urine tox when admitted was positive for amphetamines, cocaine, marijuana and tricyclic antidepressants.  5.Delirium tremens- -on Ativan, GEODON and Haldol when necessary  - placed on involuntary commitment by dr clapacs today  6.h/o ETOH abuse; continue Thiamine and folic acid,  -On Ativan, haldol for withdrawals  7. Stage 2 Lt ischial tuberosity pressure ulcer: noted by nurse, present on admission. Dressing applied.   Discussed with psychiatry Dr. Toni Amend - he has agreed to take him to inpatient behavioral unit, patient is medically clear, he did have one fall today, but did not have any injury.  It does not have any active medical problems.  He has also ambulated.  If he chooses to do so, also with physical therapy and has been documented in the records.  Per Dr. Toni Amend, he will likely be transferred tomorrow   All the records are reviewed and case discussed with Care Management/Social Worker. Management plans discussed with the patient, family and they are in agreement.  CODE STATUS: Full Code  TOTAL TIME SPENT IN TAKING CARE OF THIS PATIENT: 35 minutes.   POSSIBLE transfer to inpatient behavioral medicine in the morning tomorrow, - per my discussion with Dr. Samuel Bouche, Sonni Barse M.D on 02/04/2016 at 4:28 PM  Between 7am to 6pm -  Pager - [phone]  After 6pm go to [email or web address] - password [email or web address] [facility]  [facility]  Office  [phone]  CC: Primary care physician; No PCP Per Patient

## 2016-02-04 NOTE — Consult Note (Signed)
  Psychiatry: Met with patient's sisters. They are frustrated with his dangerous behavior. I explained that based on his current behavior he does not need psychiatric admission. Provided some information on resourses.

## 2016-02-04 NOTE — Progress Notes (Signed)
Code 300 has been called on pt. 2 times during the shift d/t pt. Fighting and trying to get out of bed. Ativan and geodon administered to pt. Throughout the night to help keep pt. Calm. Sitter at the bedside and pt. Resting quietly at this time.

## 2016-02-04 NOTE — Care Management (Signed)
Spoke with patient's sister Gavin Pound and her husband Loraine Leriche regarding discharge. They would like to see him get into a drug treatment however "he has been to one and it didn't do any good". I explained that patient agreed but would have to follow through but in the meantime patient may discharge tomorrow or Sunday and will need to go home with someone. I explained that patient appeared to be grieving the loss of his girlfriend- they agreed. I explained he needed support during this time an that he needed to be away from drugs/alcohol. They agreed. Mark agreed for patient to go this "his rental trailer at discharge". He did say that he had to check with someone. Gavin Pound mentioned that patient has court on Tuesday for DWIs and that she needed a MD excuse. I explained that he may not be here until Tuesday and that he would need to be in court if he wasn't. She states he may go to prison. I explained that may be the best plan if he could not get continued care in the home. They have agreed to taking patient into their home.

## 2016-02-05 ENCOUNTER — Ambulatory Visit (HOSPITAL_COMMUNITY): Payer: Self-pay | Admitting: Behavioral Health

## 2016-02-05 DIAGNOSIS — F1994 Other psychoactive substance use, unspecified with psychoactive substance-induced mood disorder: Secondary | ICD-10-CM

## 2016-02-05 LAB — GLUCOSE, CAPILLARY
Glucose-Capillary: 144 mg/dL — ABNORMAL HIGH (ref 65–99)
Glucose-Capillary: 119 mg/dL — ABNORMAL HIGH (ref 65–99)
Glucose-Capillary: 136 mg/dL — ABNORMAL HIGH (ref 65–99)

## 2016-02-05 MED ORDER — HALOPERIDOL LACTATE 5 MG/ML IJ SOLN
5.0000 mg | Freq: Once | INTRAMUSCULAR | Status: AC
  Administered 2016-02-05: 5 mg via INTRAVENOUS

## 2016-02-05 MED ORDER — HALOPERIDOL LACTATE 5 MG/ML IJ SOLN
5.0000 mg | Freq: Once | INTRAMUSCULAR | Status: AC
  Administered 2016-02-05: 5 mg via INTRAVENOUS
  Filled 2016-02-05: qty 1

## 2016-02-05 MED ORDER — DIPHENHYDRAMINE HCL 50 MG/ML IJ SOLN
50.0000 mg | Freq: Once | INTRAMUSCULAR | Status: AC
  Administered 2016-02-05: 50 mg via INTRAVENOUS
  Filled 2016-02-05: qty 1

## 2016-02-05 MED ORDER — LORAZEPAM 2 MG/ML IJ SOLN
4.0000 mg | Freq: Once | INTRAMUSCULAR | Status: AC
  Administered 2016-02-05: 4 mg via INTRAVENOUS

## 2016-02-05 NOTE — Progress Notes (Signed)
Spoke with Dr.Hower regarding patient need of additional medications for agitation. Order received for  ativan 4 mg iv x 1, haldol 5 mg iv x 1, and benadryl 50 mg iv x 1.

## 2016-02-05 NOTE — Progress Notes (Signed)
Code 300 called, patient agitated, wanting to go home, getting out of bed. Sitter at the bedside.  Given prn meds for agitation.

## 2016-02-05 NOTE — Progress Notes (Signed)
Code 300 called

## 2016-02-05 NOTE — Progress Notes (Signed)
Security at the bedside

## 2016-02-05 NOTE — Progress Notes (Signed)
[facility] - [place] at [facility]   PATIENT NAME: Duane Hayes    MR#:  [id number]  DATE OF BIRTH:  08-Jan-1961  SUBJECTIVE:  No complaints at this time resting comfortably - after receiving Haldol this morning Overnight had code 300 called multiple times due to agitation  REVIEW OF SYSTEMS:  Unable to answer at this time given mental status post medication  DRUG ALLERGIES:  No Known Allergies  VITALS:  Blood pressure 121/79, pulse 92, temperature 98 F (36.7 C), temperature source Oral, resp. rate 16, height  (1.727 m), weight 61.4 kg (135 lb 5.8 oz), SpO2 100 %.  PHYSICAL EXAMINATION:  VITAL SIGNS: Filed Vitals:   02/05/16 0821 02/05/16 1129  BP: 110/88 121/79  Pulse: 105 92  Temp: 98.4 F (36.9 C) 98 F (36.7 C)  Resp:  16   GENERAL:55 y.o.male currently resting comfortably HEAD: Normocephalic, atraumatic.  EYES: Pupils equal, round, reactive to light. Extraocular muscles intact. No scleral icterus.  MOUTH: Moist mucosal membrane. Dentition intact. No abscess noted.  EAR, NOSE, THROAT: Clear without exudates. No external lesions.  NECK: Supple. No thyromegaly. No nodules. No JVD.  PULMONARY: Clear to ascultation, without wheeze rails or rhonci. No use of accessory muscles, Good respiratory effort. good air entry bilaterally CHEST: Nontender to palpation.  CARDIOVASCULAR: S1 and S2. Regular rate and rhythm. No murmurs, rubs, or gallops. No edema. Pedal pulses 2+ bilaterally.  GASTROINTESTINAL: Soft, nontender, nondistended. No masses. Positive bowel sounds. No hepatosplenomegaly.  MUSCULOSKELETAL: No swelling, clubbing, or edema. Range of motion full in all extremities.  NEUROLOGIC: Unable to evaluate at this time post medication SKIN: No ulceration, lesions, rashes, or cyanosis. Skin warm and dry. Turgor intact.  PSYCHIATRIC: Unable to evaluate at this time post medication   LABORATORY PANEL:   CBC  Recent Labs Lab  02/01/16 0432  WBC 11.5*  HGB 11.5*  HCT 33.9*  PLT 426   ------------------------------------------------------------------------------------------------------------------  Chemistries   Recent Labs Lab 02/02/16 0509  NA 137  K 4.8  CL 109  CO2 21*  GLUCOSE 319*  BUN 7  CREATININE 0.67  CALCIUM 8.1*  MG 1.8   ------------------------------------------------------------------------------------------------------------------  Cardiac Enzymes No results for input(s): TROPONINI in the last 168 hours. ------------------------------------------------------------------------------------------------------------------  RADIOLOGY:  No results found.  EKG:   Orders placed or performed during the Hayes encounter of 01/21/16  . ED EKG  . ED EKG  . EKG 12-Lead  . EKG 12-Lead  . EKG 12-Lead  . EKG 12-Lead  . EKG 12-Lead  . EKG 12-Lead  . EKG 12-Lead  . EKG 12-Lead    ASSESSMENT AND PLAN:   55 year old man admitted 01/21/16 after being found in a motel room covered in ice. Likely overdose  1.Acute respiratory failure : due to Drug Overdose and alcohol intoxication; And aspiration pneumonia - resolved. Off abx   2.Cardiac arrest with hypothermia/very brief CPR; hypothermia and bradycardia are resolved.  3. Hypokalemia- replaced and resolved  4.Drug overdose-possible suicidal attempt; continue sitter.  -Psych following, .IVC  5.Delirium tremens- CIWA  6.h/o ETOH abuse; continue Thiamine and folic acid,  -On Ativan, haldol for withdrawals  7. Stage 2 Lt ischial tuberosity pressure ulcer: noted by nurse, present on admission. Dressing applied.   Discussed with psychiatry Dr. Toni Amend - he has agreed to take him to inpatient behavioral unit, patient is medically clear, does not have any active medical problems.  [facility]   All the records are reviewed and  case discussed with Care Management/Social  Workerr. Management plans discussed with the patient, family and they are in agreement.  CODE STATUS: Full  TOTAL TIME TAKING CARE OF THIS PATIENT: 28 minutes.   POSSIBLE D/C IN 1 DAYS, DEPENDING ON CLINICAL CONDITION.   Duane Hayes,  Duane Hayes.D on 02/05/2016 at 11:47 AM  Between 7am to 6pm - Pager - [phone]  After 6pm: House Pager: - [phone]  [facility]  Office  [phone]  CC: Primary care physician; No PCP Per Patient

## 2016-02-05 NOTE — Progress Notes (Signed)
Notified MD of code 300. Pt agitated and combative at this time. Unable to calm patient. MD to place orders,

## 2016-02-05 NOTE — Progress Notes (Signed)
Code 300 called, sitter at bedside, pt's agitated- wanting to go home, unable to calm patient. PRN med given by charge nurse Theodoro Grist.

## 2016-02-05 NOTE — BH Assessment (Signed)
This Clinical research associate spoke with [employer] Nurse Sue Lush, RN) and directed her to call Charge at [phone]) in reference to pt being admitted to [facility]. Due to history of fall risk per Sue Lush, RN.

## 2016-02-06 LAB — GLUCOSE, CAPILLARY
Glucose-Capillary: 113 mg/dL — ABNORMAL HIGH (ref 65–99)
Glucose-Capillary: 96 mg/dL (ref 65–99)
Glucose-Capillary: 101 mg/dL — ABNORMAL HIGH (ref 65–99)
Glucose-Capillary: 80 mg/dL (ref 65–99)

## 2016-02-06 NOTE — Progress Notes (Signed)
[facility] - [place] at [facility]   PATIENT NAME: Duane Hayes    MR#:  [id number]  DATE OF BIRTH:  Nov 26, 1961  SUBJECTIVE:  No complaints, agitated states wants to leave the hospital REVIEW OF SYSTEMS:  CONSTITUTIONAL: No fever, fatigue or weakness.  EYES: No blurred or double vision.  EARS, NOSE, AND THROAT: No tinnitus or ear pain.  RESPIRATORY: No cough, shortness of breath, wheezing or hemoptysis.  CARDIOVASCULAR: No chest pain, orthopnea, edema.  GASTROINTESTINAL: No nausea, vomiting, diarrhea or abdominal pain.  GENITOURINARY: No dysuria, hematuria.  ENDOCRINE: No polyuria, nocturia,  HEMATOLOGY: No anemia, easy bruising or bleeding SKIN: No rash or lesion. MUSCULOSKELETAL: No joint pain or arthritis.   NEUROLOGIC: No tingling, numbness, weakness.  PSYCHIATRY: No anxiety or depression.   DRUG ALLERGIES:  No Known Allergies  VITALS:  Blood pressure 102/57, pulse 80, temperature 98.8 F (37.1 C), temperature source Oral, resp. rate 18, height  (1.727 m), weight 61.4 kg (135 lb 5.8 oz), SpO2 99 %.  PHYSICAL EXAMINATION:  VITAL SIGNS: Filed Vitals:   02/06/16 0637 02/06/16 0739  BP: 106/75 102/57  Pulse: 97 80  Temp: 98.4 F (36.9 C) 98.8 F (37.1 C)  Resp: 20 18   GENERAL:55 y.o.male currently in no acute distress.  HEAD: Normocephalic, atraumatic.  EYES: Pupils equal, round, reactive to light. Extraocular muscles intact. No scleral icterus.  MOUTH: Moist mucosal membrane. Dentition intact. No abscess noted.  EAR, NOSE, THROAT: Clear without exudates. No external lesions.  NECK: Supple. No thyromegaly. No nodules. No JVD.  PULMONARY: Clear to ascultation, without wheeze rails or rhonci. No use of accessory muscles, Good respiratory effort. good air entry bilaterally CHEST: Nontender to palpation.  CARDIOVASCULAR: S1 and S2. Regular rate and rhythm. No murmurs, rubs, or gallops. No edema. Pedal pulses 2+ bilaterally.   GASTROINTESTINAL: Soft, nontender, nondistended. No masses. Positive bowel sounds. No hepatosplenomegaly.  MUSCULOSKELETAL: No swelling, clubbing, or edema. Range of motion full in all extremities.  NEUROLOGIC: Cranial nerves II through XII are intact. No gross focal neurological deficits. Sensation intact. Reflexes intact.  SKIN: No ulceration, lesions, rashes, or cyanosis. Skin warm and dry. Turgor intact.  PSYCHIATRIC: Mood, affect within normal limits. The patient is awake, alert and oriented x 3. Insight, judgment intact.      LABORATORY PANEL:   CBC  Recent Labs Lab 02/01/16 0432  WBC 11.5*  HGB 11.5*  HCT 33.9*  PLT 426   ------------------------------------------------------------------------------------------------------------------  Chemistries   Recent Labs Lab 02/02/16 0509  NA 137  K 4.8  CL 109  CO2 21*  GLUCOSE 319*  BUN 7  CREATININE 0.67  CALCIUM 8.1*  MG 1.8   ------------------------------------------------------------------------------------------------------------------  Cardiac Enzymes No results for input(s): TROPONINI in the last 168 hours. ------------------------------------------------------------------------------------------------------------------  RADIOLOGY:  No results found.  EKG:   Orders placed or performed during the hospital encounter of 01/21/16  . ED EKG  . ED EKG  . EKG 12-Lead  . EKG 12-Lead  . EKG 12-Lead  . EKG 12-Lead  . EKG 12-Lead  . EKG 12-Lead  . EKG 12-Lead  . EKG 12-Lead    ASSESSMENT AND PLAN:   55 year old man admitted 01/21/16 after being found in a motel room covered in ice. Likely overdose  1.Acute respiratory failure : due to Drug Overdose and alcohol intoxication; And aspiration pneumonia - resolved. Off abx   2.Cardiac arrest with hypothermia/very brief CPR; hypothermia and bradycardia are resolved.  3. Hypokalemia- replaced and resolved  4.Drug  overdose-possible suicidal attempt;  continue sitter.  -Psych following, .IVC  5.Delirium tremens- CIWA  6.h/o ETOH abuse; continue Thiamine and folic acid,  -On Ativan, haldol for withdrawals  7. Stage 2 Lt ischial tuberosity pressure ulcer: noted by nurse, present on admission. Dressing applied.   Discussed with psychiatry Dr. Toni Amend - he has agreed to take him to inpatient behavioral unit, patient is medically clear, does not have any active medical problems.  Disposition: Transfer to inpatient psychiatry with availability- discussed with dr Garnetta Buddy - likely transfer tomorrow     All the records are reviewed and case discussed with Care Management/Social Workerr. Management plans discussed with the patient, family and they are in agreement.  CODE STATUS: Full  TOTAL TIME TAKING CARE OF THIS PATIENT: 28 minutes.   POSSIBLE D/C IN 1 DAYS, DEPENDING ON CLINICAL CONDITION.   Hower,  [name].D on 02/06/2016 at 11:03 AM  Between 7am to 6pm - Pager - [phone]  After 6pm: House Pager: - [phone]  [name]  Office  [phone]  CC: Primary care physician; No PCP Per Patient

## 2016-02-06 NOTE — Progress Notes (Signed)
Code 300 called. Sitter at bedside, patient wanting to leave the hospital, cursing and wanting to be with his wife per sitter. Patient assisted to bed, given prn haldol 2 mg.

## 2016-02-06 NOTE — Care Management Note (Addendum)
Case Management Note  Patient Details  Name: Duane Hayes MRN: [id number] Date of Birth: 05/15/1961  Subjective/Objective:     Duane Hayes is currently under IVC and is being followed by Dr Toni Amend in Psychiatry. Current discharge plan is to transfer to [facility] unit.               Action/Plan:   Expected Discharge Date:                  Expected Discharge Plan:     In-House Referral:     Discharge planning Services     Post Acute Care Choice:    Choice offered to:     DME Arranged:    DME Agency:     HH Arranged:    HH Agency:     Status of Service:     Medicare Important Message Given:    Date Medicare IM Given:    Medicare IM give by:    Date Additional Medicare IM Given:    Additional Medicare Important Message give by:     If discussed at Long Length of Stay Meetings, dates discussed:    Additional Comments:  Duane Hayes A, RN 02/06/2016, 3:57 PM

## 2016-02-06 NOTE — Progress Notes (Signed)
Pt's ambulating in hallway with sitter.

## 2016-02-07 ENCOUNTER — Encounter: Payer: Self-pay | Admitting: *Deleted

## 2016-02-07 ENCOUNTER — Inpatient Hospital Stay
Admit: 2016-02-07 | Discharge: 2016-02-12 | DRG: 897 | Disposition: A | Discharge status: Home / Self Care | Payer: No Typology Code available for payment source | Source: Intra-hospital | Attending: Psychiatry | Admitting: Psychiatry

## 2016-02-07 DIAGNOSIS — F1120 Opioid dependence, uncomplicated: Secondary | ICD-10-CM

## 2016-02-07 DIAGNOSIS — Z9889 Other specified postprocedural states: Secondary | ICD-10-CM

## 2016-02-07 DIAGNOSIS — M549 Dorsalgia, unspecified: Secondary | ICD-10-CM | POA: Diagnosis present

## 2016-02-07 DIAGNOSIS — F1994 Other psychoactive substance use, unspecified with psychoactive substance-induced mood disorder: Secondary | ICD-10-CM

## 2016-02-07 DIAGNOSIS — F1020 Alcohol dependence, uncomplicated: Secondary | ICD-10-CM

## 2016-02-07 DIAGNOSIS — F1123 Opioid dependence with withdrawal: Secondary | ICD-10-CM | POA: Diagnosis present

## 2016-02-07 DIAGNOSIS — F17210 Nicotine dependence, cigarettes, uncomplicated: Secondary | ICD-10-CM | POA: Diagnosis present

## 2016-02-07 DIAGNOSIS — F1510 Other stimulant abuse, uncomplicated: Secondary | ICD-10-CM | POA: Diagnosis present

## 2016-02-07 DIAGNOSIS — G4700 Insomnia, unspecified: Secondary | ICD-10-CM | POA: Diagnosis present

## 2016-02-07 DIAGNOSIS — F10231 Alcohol dependence with withdrawal delirium: Secondary | ICD-10-CM | POA: Diagnosis present

## 2016-02-07 DIAGNOSIS — F1220 Cannabis dependence, uncomplicated: Secondary | ICD-10-CM | POA: Diagnosis present

## 2016-02-07 DIAGNOSIS — M1990 Unspecified osteoarthritis, unspecified site: Secondary | ICD-10-CM | POA: Diagnosis present

## 2016-02-07 DIAGNOSIS — F1590 Other stimulant use, unspecified, uncomplicated: Secondary | ICD-10-CM

## 2016-02-07 DIAGNOSIS — G8929 Other chronic pain: Secondary | ICD-10-CM | POA: Diagnosis present

## 2016-02-07 DIAGNOSIS — F17200 Nicotine dependence, unspecified, uncomplicated: Secondary | ICD-10-CM

## 2016-02-07 DIAGNOSIS — F329 Major depressive disorder, single episode, unspecified: Secondary | ICD-10-CM | POA: Diagnosis present

## 2016-02-07 DIAGNOSIS — Z915 Personal history of self-harm: Secondary | ICD-10-CM | POA: Diagnosis not present

## 2016-02-07 DIAGNOSIS — F419 Anxiety disorder, unspecified: Secondary | ICD-10-CM | POA: Diagnosis present

## 2016-02-07 DIAGNOSIS — F1924 Other psychoactive substance dependence with psychoactive substance-induced mood disorder: Principal | ICD-10-CM | POA: Diagnosis present

## 2016-02-07 LAB — GLUCOSE, CAPILLARY
Glucose-Capillary: 104 mg/dL — ABNORMAL HIGH (ref 65–99)
Glucose-Capillary: 103 mg/dL — ABNORMAL HIGH (ref 65–99)

## 2016-02-07 MED ORDER — HALOPERIDOL 5 MG PO TABS: 5.0000 mg | ORAL_TABLET | Freq: Three times a day (TID) | ORAL | Status: DC | PRN

## 2016-02-07 MED ORDER — ALUM & MAG HYDROXIDE-SIMETH 200-200-20 MG/5ML PO SUSP
30.0000 mL | ORAL | Status: DC | PRN
  Administered 2016-02-09 – 2016-02-11 (×4): 30 mL via ORAL
  Filled 2016-02-07 (×4): qty 30

## 2016-02-07 MED ORDER — OXYCODONE HCL 5 MG PO TABS
5.0000 mg | ORAL_TABLET | ORAL | Status: DC | PRN
  Administered 2016-02-07: 5 mg via ORAL
  Filled 2016-02-07: qty 1

## 2016-02-07 MED ORDER — ACETAMINOPHEN 325 MG PO TABS
650.0000 mg | ORAL_TABLET | Freq: Four times a day (QID) | ORAL | Status: DC | PRN
  Administered 2016-02-07 – 2016-02-12 (×10): 650 mg via ORAL
  Filled 2016-02-07 (×10): qty 2

## 2016-02-07 MED ORDER — DIPHENHYDRAMINE HCL 50 MG/ML IJ SOLN: 50.0000 mg | Freq: Three times a day (TID) | INTRAMUSCULAR | Status: DC | PRN

## 2016-02-07 MED ORDER — DIPHENHYDRAMINE HCL 25 MG PO CAPS: 50.0000 mg | ORAL_CAPSULE | Freq: Three times a day (TID) | ORAL | Status: DC | PRN

## 2016-02-07 MED ORDER — HALOPERIDOL 0.5 MG PO TABS
0.5000 mg | ORAL_TABLET | Freq: Three times a day (TID) | ORAL | Status: DC
  Administered 2016-02-07 – 2016-02-12 (×15): 0.5 mg via ORAL
  Filled 2016-02-07 (×15): qty 1

## 2016-02-07 MED ORDER — NICOTINE 21 MG/24HR TD PT24
21.0000 mg | MEDICATED_PATCH | Freq: Every day | TRANSDERMAL | Status: DC
  Administered 2016-02-07 – 2016-02-12 (×6): 21 mg via TRANSDERMAL
  Filled 2016-02-07 (×6): qty 1

## 2016-02-07 MED ORDER — TRAZODONE HCL 100 MG PO TABS
100.0000 mg | ORAL_TABLET | Freq: Every evening | ORAL | Status: DC | PRN
Start: 2016-02-07 — End: 2016-02-07

## 2016-02-07 MED ORDER — TRAZODONE HCL 100 MG PO TABS
100.0000 mg | ORAL_TABLET | Freq: Every day | ORAL | Status: DC
  Administered 2016-02-07: 100 mg via ORAL
  Filled 2016-02-07: qty 1

## 2016-02-07 MED ORDER — MAGNESIUM HYDROXIDE 400 MG/5ML PO SUSP: 30.0000 mL | Freq: Every day | ORAL | Status: DC | PRN

## 2016-02-07 MED ORDER — TRAZODONE HCL 50 MG PO TABS: 50.0000 mg | ORAL_TABLET | Freq: Every evening | ORAL | Status: DC | PRN

## 2016-02-07 MED ORDER — HALOPERIDOL LACTATE 5 MG/ML IJ SOLN: 5.0000 mg | Freq: Three times a day (TID) | INTRAMUSCULAR | Status: DC | PRN

## 2016-02-07 NOTE — Progress Notes (Signed)
Patient ID: DECKLYN HYDER, male   DOB: 09-Jul-1961, 55 y.o.   MRN: [id number] Spoke with Pt's son Bradshaw Minihan, [phone] after Pt gave verbal permission.  Pt also requested that letter be sent to court as he has a court date tomorrow.  SW faxed to attorney [phone].  SW informed Pt's son of pass code and unit phone and visiting hours.  Informed him of assigned doctor and social worker and agreed to touch base tomorrow after treatment team with more information if available.  Jake Shark, MSW, LCSW

## 2016-02-07 NOTE — Progress Notes (Signed)
Patient admitted to [facility] unit. Alert and oriented with some mild confusion and unsteady gait. Pt found in hotel room unconscious positive for cocaine and opiates. Pt reports girlfriend died recently and was tearful during assessment. Pt guarded, and irritable. Pt Endorses depression but denies SI, HI, AVH at present. Pt reports cocaine and alcohol abuse. Oriented patient to room and unit. Skin and contraband search completed and witnessed no contraband or skin issues noted. Pt has tattoos to bilateral upper extremities. Will continue to assess and monitor for safety.

## 2016-02-07 NOTE — [facility] Group Notes (Signed)
[facility] Group Notes:  (Nursing/MHT/Case Management/Adjunct)  Date:  02/07/2016  Time:  10:28 PM  Type of Therapy:  Group Therapy  Participation Level:  Active  Participation Quality:  Appropriate  Affect:  Appropriate  Cognitive:  Appropriate  Insight:  Appropriate  Engagement in Group:  Engaged  Modes of Intervention:  Discussion  Summary of Progress/Problems:  Burt Ek 02/07/2016, 10:28 PM

## 2016-02-07 NOTE — H&P (Signed)
Psychiatric Admission Assessment Adult  Patient Identification: Duane Hayes MRN:  [id number] Date of Evaluation:  02/07/2016 Chief Complaint: s/p overdose Principal Diagnosis: Substance induced mood disorder (HCC) Diagnosis:   Patient Active Problem List   Diagnosis Date Noted  . Tobacco use disorder [F17.200] 02/07/2016  . Opioid use disorder, severe, dependence (HCC) [F11.20] 02/07/2016  . Cannabis use disorder, severe, dependence (HCC) [F12.20] 02/07/2016  . Alcohol use disorder, severe, dependence (HCC) [F10.20] 02/07/2016  . Stimulant use disorder (HCC) [F15.90] 02/07/2016  . Substance induced mood disorder (HCC) [F19.94] 02/07/2016  . Decubitus ulcer of ischium, stage 2 [L89.302] 02/07/2016   History of Present Illness:  The patient presented the emergency department via EMS after being found unconscious in a motel room on 01/21/16. The patient was found with ice all over his body. Initially the patient's pulse was about 20 bpm and respirations were shallow. The patient was given Narcan and became very agitated. During an attempt to subdue the patient in order to supply supplemental oxygen he lost a pulse. CPR was started and achieved return of circulation. The patient's urine toxicology screen was positive for cannabis, amphetamines, and tricyclics. Alcohol was also found in the patient's blood (131). Head CT was neg  Patient require mechanical ventilation from January 27 to February 7. During his stay in ICU patient developed delirium tremens.  Patient does not recall coming into the hospital but does recall that he was having a big party that night and that he was doing heroin. He doesn't recall what other drugs he was doing. He is not particularly surprised at the fact that he was found covered with ice. He assumes that the people he was partying with notice that he was getting hyperthermic and so they report ice on him and called 911. Patient denies that he's been having any  symptoms of depression. Currently however he describes his mood is irritable and frustrated. He denies being aware of any psychiatric problems prior to hospitalization. He completely denies that there was any suicidal intent or plan involved in what brought him into the hospital and denies any suicidal or homicidal ideation now.  Patient states he does not want to be in the psychiatric unit. He would like to be discharged back home. He has no desire to attend rehabilitation.  He has been getting multiple doses of benzodiazepines and antipsychotics to control his agitation.  He was on one to one with a sitter while on the medical floor. Today during the interview patient had episodes of confusion and appears to have unsteady gait. I do agree with the patient appears to still have delirium.  Substance abuse history: Per records [facility] patient has a long history of alcohol, cocaine heroine and cannabis use. Patient also smoke one pack of cigarettes per day  Per records on [facility] June 2016: "Duane Hayes is a 55 y.o. male who presents via EMS for a fall. He states he was with friends drinking liquor and the next thing he knew he was on the ground. EMS reports that heroin was found in the car and that when his girlfriend opened his car door he fell onto his face. He was ambulatory on scene, was placed in a C-Collar, and became increasingly somnolent en route and was given 0.4mg  Narcan IV in triage for RR 8 and SPO2 79%. He admits to headache "like my brain is on fire," but denies any focal tenderness, blurry vision, double vision, or any other pain or complaints.  He denies any medical history other than a shoulder surgery >10 years ago. He admits to alcohol use today and states someone may have shared a drug with him earlier."  No prior records in our system other than some visits to the emergency room.  Associated Signs/Symptoms: Depression Symptoms:  depressed mood, (Hypo) Manic  Symptoms:  denies Anxiety Symptoms:  denies Psychotic Symptoms:  denies PTSD Symptoms: NA Total Time spent with patient: 1 hour  Past Psychiatric History: Patient denies that he was being treated for any mental health problems. He said that he had 1 suicide attempt 30 or 40 years ago by cutting his wrist and he was in a psychiatric hospital at that time. He doesn't know any other details. Doesn't report any other suicidal behavior since then and claims to have no suicidal thoughts at all now.   Past Medical History:  Past Medical History  Diagnosis Date  . Arthritis     Past Surgical History  Procedure Laterality Date  . Shoulder surgery     Family History:Patient states that both his mother and father "didn't understand life too well". I couldn't get him to be any more specific than that. He says he's the only person in his family with a drug problem.  Social History: Patient tells me that he lives in a house with his ex-wife. He remembers her name being Rosann Auerbach, but cannot remember a phone number for her. The only phone number I could find in the chart was of his sister but when I called that number I was not able to leave a message because the mailbox was full History  Alcohol Use  . Yes     History  Drug Use  . Yes  . Special: Cocaine     Allergies:  Not on File   Lab Results:  Results for orders placed or performed during the hospital encounter of 01/21/16 (from the past 48 hour(s))  Glucose, capillary     Status: Abnormal   Collection Time: 02/05/16  4:07 PM  Result Value Ref Range   Glucose-Capillary 119 (H) 65 - 99 mg/dL   Comment 1 Notify RN   Glucose, capillary     Status: Abnormal   Collection Time: 02/05/16  8:47 PM  Result Value Ref Range   Glucose-Capillary 136 (H) 65 - 99 mg/dL   Comment 1 Notify RN   Glucose, capillary     Status: Abnormal   Collection Time: 02/06/16  7:19 AM  Result Value Ref Range   Glucose-Capillary 113 (H) 65 - 99 mg/dL   Glucose, capillary     Status: None   Collection Time: 02/06/16 11:18 AM  Result Value Ref Range   Glucose-Capillary 96 65 - 99 mg/dL   Comment 1 Notify RN   Glucose, capillary     Status: Abnormal   Collection Time: 02/06/16  4:25 PM  Result Value Ref Range   Glucose-Capillary 101 (H) 65 - 99 mg/dL   Comment 1 Notify RN   Glucose, capillary     Status: None   Collection Time: 02/06/16  9:24 PM  Result Value Ref Range   Glucose-Capillary 80 65 - 99 mg/dL   Comment 1 Notify RN   Glucose, capillary     Status: Abnormal   Collection Time: 02/07/16  7:49 AM  Result Value Ref Range   Glucose-Capillary 104 (H) 65 - 99 mg/dL   Comment 1 Notify RN   Glucose, capillary     Status: Abnormal  Collection Time: 02/07/16 11:11 AM  Result Value Ref Range   Glucose-Capillary 103 (H) 65 - 99 mg/dL   Comment 1 Notify RN     Metabolic Disorder Labs:  Lab Results  Component Value Date   HGBA1C 5.6 01/21/2016   No results found for: PROLACTIN Lab Results  Component Value Date   TRIG 60 01/26/2016    Current Medications: Current Facility-Administered Medications  Medication Dose Route Frequency Provider Last Rate Last Dose  . acetaminophen (TYLENOL) tablet 650 mg  650 mg Oral Q6H PRN Jimmy Footman, MD      . alum & mag hydroxide-simeth (MAALOX/MYLANTA) 200-200-20 MG/5ML suspension 30 mL  30 mL Oral Q4H PRN Jimmy Footman, MD      . diphenhydrAMINE (BENADRYL) capsule 50 mg  50 mg Oral Q8H PRN Jimmy Footman, MD       Or  . diphenhydrAMINE (BENADRYL) injection 50 mg  50 mg Intramuscular Q8H PRN Jimmy Footman, MD      . haloperidol (HALDOL) tablet 0.5 mg  0.5 mg Oral TID Jimmy Footman, MD      . haloperidol (HALDOL) tablet 5 mg  5 mg Oral Q8H PRN Jimmy Footman, MD       Or  . haloperidol lactate (HALDOL) injection 5 mg  5 mg Intramuscular Q8H PRN Jimmy Footman, MD      . magnesium hydroxide (MILK OF  MAGNESIA) suspension 30 mL  30 mL Oral Daily PRN Jimmy Footman, MD      . nicotine (NICODERM CQ - dosed in mg/24 hours) patch 21 mg  21 mg Transdermal Daily Jimmy Footman, MD      . traZODone (DESYREL) tablet 100 mg  100 mg Oral QHS Jimmy Footman, MD       PTA Medications: No prescriptions prior to admission    Musculoskeletal: Strength & Muscle Tone: within normal limits Gait & Station: unsteady Patient leans: N/A  Psychiatric Specialty Exam: Physical Exam  Constitutional: He is oriented to person, place, and time. He appears well-developed and well-nourished.  HENT:  Head: Normocephalic and atraumatic.  Eyes: Conjunctivae and EOM are normal.  Neck: Normal range of motion. Neck supple.  Musculoskeletal: Normal range of motion.  Neurological: He is alert and oriented to person, place, and time.  Skin: Skin is warm and dry.    Review of Systems  Constitutional: Negative.   HENT: Negative.   Eyes: Negative.   Respiratory: Negative.   Cardiovascular: Negative.   Gastrointestinal: Negative.   Genitourinary: Negative.   Musculoskeletal: Negative.   Skin: Negative.   Neurological: Negative.   Endo/Heme/Allergies: Negative.   Psychiatric/Behavioral: Negative.     Blood pressure 110/59, pulse 90, temperature 98.5 F (36.9 C), temperature source Oral, resp. rate 18, height 5\' 9"  (1.753 m), weight 60.782 kg (134 lb), SpO2 99 %.Body mass index is 19.78 kg/(m^2).  General Appearance: Fairly Groomed  Patent attorney::  Good  Speech:  Slurred  Volume:  Normal  Mood:  Irritable  Affect:  Constricted  Thought Process:  Tangential  Orientation:  Full (Time, Place, and Person)  Thought Content:  Hallucinations: None  Suicidal Thoughts:  No  Homicidal Thoughts:  No  Memory:  Immediate;   Poor Recent;   Fair Remote;   Fair  Judgement:  Impaired  Insight:  Lacking  Psychomotor Activity:  Decreased  Concentration:  Poor  Recall:  Poor  Fund of  Knowledge:Fair  Language: Fair  Akathisia:  No  Handed:    AIMS (if indicated):  Assets:  Manufacturing systems engineer Physical Health  ADL's:  Intact  Cognition: WNL  Sleep:        Treatment Plan Summary: Daily contact with patient to assess and evaluate symptoms and progress in treatment and Medication management   55 year old Caucasian male with known history of opiate, alcohol, cocaine and cannabis dependent 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  Per nurse's information to other people were found unresponsive at the same location.  Per review of records looks like the patient does not have a prior history of mental illness. He does have a documented history of alcohol and opiate addiction.  Patient says he has been in rehabilitation a multitude of times. Patient also reports criminal behavior. Says he has been in prison a multitude of times and has completed rehabilitation while in prison.  Patient is is still confused and at this time information he provided is unreliable.  Most of the information use to complete this assessment was obtained from the medical record.  As of nowthe Main diagnoses appear to be delirium caused by possible substance withdrawal and I rule out substance-induced mood disorder. Patient says that his girlfriend of 2 years passed away in 11/13/15.  Patient says he has had great difficulty coping with the situation.   For delirium: I will give the patient Haldol 0.5 mg by mouth every 8 hours.  Agitation: I will order Haldol 5 mg every 8 hours IM or by mouth along with Benadryl 50 mg every 8 hours IM or by mouth.  We will avoid the use of benzodiazepines  Insomnia: Patient will be started on trazodone 100 mg by mouth daily at bedtime  Tobacco use disorder: Patient will receive nicotine patch 21 mg  Alcohol, opiate withdrawal: Appears to have some residual delirium from episode of delirium tremens while on the medical  floor.  Alcohol-cocaine-heroin-cannabis dependence: Currently patient says he is not interested in inpatient treatment for substance abuse. We plan to refer to outpatient substance abuse treatment once stable.  Unsteady gait: I will order a rolling walker. Patient will be continued on one-to-one precautions.   Precautions one-to-one due to unsteady gait and confusion  Diet regular   I certify that inpatient services furnished can reasonably be expected to improve the patient's condition.    Jimmy Footman, MD 2/13/20173:28 PM

## 2016-02-07 NOTE — Progress Notes (Signed)
Physical Therapy Treatment Patient Details Name: Duane Hayes MRN: [id number] DOB: 06-01-1961 Today's Date: 02/07/2016    History of Present Illness Patient is a 55 y/o male that presents after apparent suicidal attempt with cocktail of illicit drugs. He coded on the way to [facility] and was resucitated with CPR. Patient was intubated for several days and has been going through DTs now that he is more alert. Pt is very lethargic at time of PT evaluation with slurred speech that at times is incoherent. Does not always answer questions and unclear if answers are reliable.    PT Comments    Pt with improved stability on this date but still with multiple stumbles during PT treatment requiring assist to prevent falls. Pt able to complete all exercises as instructed with poor single leg balance and poor tandem balance. Pt is still a high fall risk and should have 1:1 assist whenever ambulating to prevent future falls. Pt should use a rolling walker at all times while ambulating. Pt will benefit from skilled PT services to address deficits in strength, balance, and mobility in order to return to full function at home.    Follow Up Recommendations  Home health PT;Supervision/Assistance - 24 hour (Pt needs 24/7 supervision due to HIGH fall risk)     Equipment Recommendations  Rolling walker with 5" wheels (Unclear if pt has at home, will need at discharge)    Recommendations for Other Services       Precautions / Restrictions Precautions Precautions: Fall Restrictions Weight Bearing Restrictions: No    Mobility  Bed Mobility Overal bed mobility: Modified Independent Bed Mobility: Sit to Supine;Supine to Sit     Supine to sit: Modified independent (Device/Increase time) Sit to supine: Modified independent (Device/Increase time)   General bed mobility comments: Pt demonstrates good sequencing and safety with bed mobility  Transfers Overall transfer level: Needs assistance Equipment  used: Rolling walker (2 wheeled) Transfers: Sit to/from Stand Sit to Stand: Min guard         General transfer comment: Pt with improved stability during transfers today. Able to perform sit to stand without assistive device but elected to utilize rolling walker for additional safety  Ambulation/Gait Ambulation/Gait assistance: Min assist Ambulation Distance (Feet): 400 Feet Assistive device: Rolling walker (2 wheeled) Gait Pattern/deviations: Step-through pattern;Staggering left;Staggering right Gait velocity: Decreased Gait velocity interpretation: <1.8 ft/sec, indicative of risk for recurrent falls General Gait Details: Pt with some R and L staggering during ambulation. He is mostly able to self-correct however has 2 epsiodes of cross-over gait where he tips to the side and requires therapist correction to prevent him from falling. Overall scanning of visual environment is poor and pt repeatedly veers to the L often bumping walls/objects. Pt with improved stability on this date but is still unsafe with ambulation and will need 1:1 assist whenever ambulating   Stairs            Wheelchair Mobility    Modified Rankin (Stroke Patients Only)       Balance Overall balance assessment: Needs assistance Sitting-balance support: No upper extremity supported Sitting balance-Leahy Scale: Good     Standing balance support: Bilateral upper extremity supported Standing balance-Leahy Scale: Fair Standing balance comment: Pt able to maintain static wide and narrow balance with eyes open/closed x 30 seconds each. Performed modified tandem with eyes open/closed x 30 seconds each as well as tandem balance eyes open x 30 seconds. Single leg balance eyes oepn x 30 seconds total  Cognition Arousal/Alertness: Awake/alert Behavior During Therapy: Flat affect;Impulsive Overall Cognitive Status: No family/caregiver present to determine baseline cognitive functioning                       Exercises General Exercises - Lower Extremity Long Arc Quad: Strengthening;Both;Seated;15 reps Heel Slides: Strengthening;Both;15 reps;Seated Hip ABduction/ADduction: Strengthening;Both;15 reps;Seated Straight Leg Raises: Strengthening;Both;Supine;15 reps Hip Flexion/Marching: Strengthening;Both;10 reps;Seated Heel Raises: Strengthening;Both;10 reps;Seated    General Comments        Pertinent Vitals/Pain Pain Assessment: 0-10 Pain Score: 8  Pain Location: Pt reports L knee pain which has been chronic since he has been admitted Pain Intervention(s): Limited activity within patient's tolerance;Monitored during session    Home Living                      Prior Function            PT Goals (current goals can now be found in the care plan section) Acute Rehab PT Goals Patient Stated Goal: Pt unable to participate in goal setting at this time PT Goal Formulation: Patient unable to participate in goal setting Time For Goal Achievement: 02/14/16 Potential to Achieve Goals: Good Progress towards PT goals: Progressing toward goals    Frequency  Min 2X/week    PT Plan Current plan remains appropriate    Co-evaluation             End of Session Equipment Utilized During Treatment: Gait belt Activity Tolerance: Patient tolerated treatment well Patient left: in bed;with nursing/sitter in room;with call bell/phone within reach (CNA present with patient, 1:1)     Time: 4132-4401 PT Time Calculation (min) (ACUTE ONLY): 23 min  Charges:  $Gait Training: 8-22 mins $Therapeutic Exercise: 8-22 mins                    G Codes:      Sharalyn Ink Hayes PT, DPT   Hayes,Duane 02/07/2016, 10:55 AM

## 2016-02-07 NOTE — BH Assessment (Signed)
Per Psych MD Dr. Toni Amend, pt meets criteria for inpatient placement and is to be admitted to [facility]. Writer awaiting bed placement.

## 2016-02-07 NOTE — Progress Notes (Signed)
Patient ambulated with PT.  Complains of pain in left knee and calf.  Requesting brace and pain medication

## 2016-02-07 NOTE — Progress Notes (Signed)
   CONCERNING: IV to Oral Route Change Policy  RECOMMENDATION: This patient is receiving thiamine by the intravenous route.  Based on criteria approved by the Pharmacy and Therapeutics Committee, the intravenous medication(s) is/are being converted to the equivalent oral dose form(s).   DESCRIPTION: These criteria include:  The patient is eating (either orally or via tube) and/or has been taking other orally administered medications for a least 24 hours  The patient has no evidence of active gastrointestinal bleeding or impaired GI absorption (gastrectomy, short bowel, patient on TNA or NPO).  If you have questions about this conversion, please contact the Pharmacy Department    [phone] )  [facility]   [phone] )  [facility]   [phone] )  [facility]   [phone] )  [facility]   [phone] )  [facility]   [place] 02/07/2016 8:57 AM

## 2016-02-07 NOTE — Care Management Note (Signed)
Case Management Note  Patient Details  Name: Duane Hayes MRN: [id number] Date of Birth: Dec 18, 1961  Subjective/Objective:       Continues to await transfer to [facility] unit.            Action/Plan:   Expected Discharge Date:                  Expected Discharge Plan:     In-House Referral:     Discharge planning Services     Post Acute Care Choice:    Choice offered to:     DME Arranged:    DME Agency:     HH Arranged:    HH Agency:     Status of Service:     Medicare Important Message Given:    Date Medicare IM Given:    Medicare IM give by:    Date Additional Medicare IM Given:    Additional Medicare Important Message give by:     If discussed at Long Length of Stay Meetings, dates discussed:    Additional Comments:  Jaqulyn Chancellor A, RN 02/07/2016, 12:17 PM

## 2016-02-07 NOTE — BHH Suicide Risk Assessment (Signed)
[facility] Admission Suicide Risk Assessment   Nursing information obtained from:    Demographic factors:    Current Mental Status:    Loss Factors:    Historical Factors:    Risk Reduction Factors:     Total Time spent with patient: 1 hour Principal Problem: Substance induced mood disorder (HCC) Diagnosis:   Patient Active Problem List   Diagnosis Date Noted  . Tobacco use disorder [F17.200] 02/07/2016  . Opioid use disorder, severe, dependence (HCC) [F11.20] 02/07/2016  . Cannabis use disorder, severe, dependence (HCC) [F12.20] 02/07/2016  . Alcohol use disorder, severe, dependence (HCC) [F10.20] 02/07/2016  . Stimulant use disorder (HCC) [F15.90] 02/07/2016  . Substance induced mood disorder (HCC) [F19.94] 02/07/2016  . Subacute delirium [F05] 02/07/2016   Subjective Data:   Continued Clinical Symptoms:  Alcohol Use Disorder Identification Test Final Score (AUDIT): 29 The "Alcohol Use Disorders Identification Test", Guidelines for Use in Primary Care, Second Edition.  World Science writer [facility]). Score between 0-7:  no or low risk or alcohol related problems. Score between 8-15:  moderate risk of alcohol related problems. Score between 16-19:  high risk of alcohol related problems. Score 20 or above:  warrants further diagnostic evaluation for alcohol dependence and treatment.   CLINICAL FACTORS:   Alcohol/Substance Abuse/Dependencies Previous Psychiatric Diagnoses and Treatments    Psychiatric Specialty Exam: ROS   COGNITIVE FEATURES THAT CONTRIBUTE TO RISK:  None    SUICIDE RISK:   Moderate:  Frequent suicidal ideation with limited intensity, and duration, some specificity in terms of plans, no associated intent, good self-control, limited dysphoria/symptomatology, some risk factors present, and identifiable protective factors, including available and accessible social support.  PLAN OF CARE: admit to [facility]  I certify that inpatient services furnished can reasonably  be expected to improve the patient's condition.   Jimmy Footman, MD 02/07/2016, 4:01 PM

## 2016-02-07 NOTE — Progress Notes (Signed)
[facility] - [place] at [facility]   PATIENT NAME: Duane Hayes    MR#:  [id number]  DATE OF BIRTH:  1961-04-16  SUBJECTIVE:  No complaints, agitated states wants to leave the hospital Ambulating in hallway with sitter REVIEW OF SYSTEMS:  CONSTITUTIONAL: No fever, fatigue or weakness.  EYES: No blurred or double vision.  EARS, NOSE, AND THROAT: No tinnitus or ear pain.  RESPIRATORY: No cough, shortness of breath, wheezing or hemoptysis.  CARDIOVASCULAR: No chest pain, orthopnea, edema.  GASTROINTESTINAL: No nausea, vomiting, diarrhea or abdominal pain.  GENITOURINARY: No dysuria, hematuria.  ENDOCRINE: No polyuria, nocturia,  HEMATOLOGY: No anemia, easy bruising or bleeding SKIN: No rash or lesion. MUSCULOSKELETAL: No joint pain or arthritis.   NEUROLOGIC: No tingling, numbness, weakness.  PSYCHIATRY: No anxiety or depression.   DRUG ALLERGIES:  No Known Allergies  VITALS:  Blood pressure 124/73, pulse 73, temperature 97.9 F (36.6 C), temperature source Oral, resp. rate 18, height  (1.727 m), weight 61.4 kg (135 lb 5.8 oz), SpO2 100 %.  PHYSICAL EXAMINATION:  VITAL SIGNS: Filed Vitals:   02/06/16 2057 02/07/16 0523  BP: 99/70 124/73  Pulse: 76 73  Temp: 97.9 F (36.6 C) 97.9 F (36.6 C)  Resp: 18 18   GENERAL:55 y.o.male currently in no acute distress.  HEAD: Normocephalic, atraumatic.  EYES: Pupils equal, round, reactive to light. Extraocular muscles intact. No scleral icterus.  MOUTH: Moist mucosal membrane. Dentition intact. No abscess noted.  EAR, NOSE, THROAT: Clear without exudates. No external lesions.  NECK: Supple. No thyromegaly. No nodules. No JVD.  PULMONARY: Clear to ascultation, without wheeze rails or rhonci. No use of accessory muscles, Good respiratory effort. good air entry bilaterally CHEST: Nontender to palpation.  CARDIOVASCULAR: S1 and S2. Regular rate and rhythm. No murmurs, rubs, or gallops. No edema.  Pedal pulses 2+ bilaterally.  GASTROINTESTINAL: Soft, nontender, nondistended. No masses. Positive bowel sounds. No hepatosplenomegaly.  MUSCULOSKELETAL: No swelling, clubbing, or edema. Range of motion full in all extremities.  NEUROLOGIC: Cranial nerves II through XII are intact. No gross focal neurological deficits. Sensation intact. Reflexes intact.  SKIN: No ulceration, lesions, rashes, or cyanosis. Skin warm and dry. Turgor intact.  PSYCHIATRIC: Mood, affect within normal limits. The patient is awake, alert and oriented x 3. Insight, judgment intact.      LABORATORY PANEL:   CBC  Recent Labs Lab 02/01/16 0432  WBC 11.5*  HGB 11.5*  HCT 33.9*  PLT 426   ------------------------------------------------------------------------------------------------------------------  Chemistries   Recent Labs Lab 02/02/16 0509  NA 137  K 4.8  CL 109  CO2 21*  GLUCOSE 319*  BUN 7  CREATININE 0.67  CALCIUM 8.1*  MG 1.8   ------------------------------------------------------------------------------------------------------------------  Cardiac Enzymes No results for input(s): TROPONINI in the last 168 hours. ------------------------------------------------------------------------------------------------------------------  RADIOLOGY:  No results found.  EKG:   Orders placed or performed during the hospital encounter of 01/21/16  . ED EKG  . ED EKG  . EKG 12-Lead  . EKG 12-Lead  . EKG 12-Lead  . EKG 12-Lead  . EKG 12-Lead  . EKG 12-Lead  . EKG 12-Lead  . EKG 12-Lead    ASSESSMENT AND PLAN:   55 year old man admitted 01/21/16 after being found in a motel room covered in ice. Likely overdose  1.Acute respiratory failure : due to Drug Overdose and alcohol intoxication; And aspiration pneumonia - resolved. Off abx   2.Cardiac arrest with hypothermia/very brief CPR; hypothermia and bradycardia are resolved.  3. Hypokalemia-  replaced and resolved  4.Drug  overdose-possible suicidal attempt; continue sitter.  -Psych following, .IVC  5.Delirium tremens- CIWA  6.h/o ETOH abuse; continue Thiamine and folic acid,  -On Ativan, haldol for withdrawals  7. Stage 2 Lt ischial tuberosity pressure ulcer: noted by nurse, present on admission. Dressing applied.    Disposition: Transfer to inpatient psychiatry with availability- page out to dr clapacs    All the records are reviewed and case discussed with Care Management/Social Workerr. Management plans discussed with the patient, family and they are in agreement.  CODE STATUS: Full  TOTAL TIME TAKING CARE OF THIS PATIENT: 28 minutes.   POSSIBLE D/C IN 1 DAYS, DEPENDING ON CLINICAL CONDITION.   Hower,  [name].D on 02/07/2016 at 12:36 PM  Between 7am to 6pm - Pager - [phone]  After 6pm: House Pager: - [phone]  [name]  Office  [phone]  CC: Primary care physician; No PCP Per Patient

## 2016-02-07 NOTE — Progress Notes (Signed)
Recreation Therapy Notes  Date: 02.13.17 Time: 3:00 pm Location: Craft Room  Group Topic: Self-expression  Goal Area(s) Addresses:  Patient will identify one color per emotion listed on wheel. Patient will verbalize benefit of use art as a means of self-expression. Patient will verbalize one emotion experienced during session.  Behavioral Response: Attentive, Interactive, Left early  Intervention: Emotion Wheel  Activity: Patients were given an Arboriculturist with 7 different emotions and instructed to pick a color for each emotion.  Education: LRT educated group on different forms of self-expression.  Education Outcome: Patient left before LRT educated group.  Clinical Observations/Feedback: Patient completed activity by picking out a color for each emotion. Patient contributed to group discussion by stating a color he picked for a certain emotion. Patient left group at approximately 3:30 pm with Dr. Ardyth Harps. Patient did not return to group.  Jacquelynn Cree, LRT/CTRS 02/07/2016 4:23 PM

## 2016-02-07 NOTE — Tx Team (Signed)
Initial Interdisciplinary Treatment Plan   PATIENT STRESSORS: Financial difficulties Loss of girlfriend Substance abuse   PATIENT STRENGTHS: Capable of independent living General fund of knowledge   PROBLEM LIST: Problem List/Patient Goals Date to be addressed Date deferred Reason deferred Estimated date of resolution  Depression 02/07/16     Substance 02/07/16                                                DISCHARGE CRITERIA:  Improved stabilization in mood, thinking, and/or behavior  PRELIMINARY DISCHARGE PLAN: Outpatient therapy  PATIENT/FAMIILY INVOLVEMENT: This treatment plan has been presented to and reviewed with the patient, Duane Hayes, and/or family member.  The patient and family have been given the opportunity to ask questions and make suggestions.  [name] 02/07/2016, 3:40 PM

## 2016-02-07 NOTE — Discharge Summary (Signed)
[facility] -  at [facility]   PATIENT NAME: Duane Hayes    MR#:  [id number]  DATE OF BIRTH:  10-01-1961  DATE OF ADMISSION:  01/21/2016 ADMITTING PHYSICIAN: Arnaldo Natal, MD  DATE OF DISCHARGE: No discharge date for patient encounter.  PRIMARY CARE PHYSICIAN: No PCP Per Patient    ADMISSION DIAGNOSIS:  Acute respiratory with hypoxia  DISCHARGE DIAGNOSIS:  Acute respiratory failure with hypoxia secondary to drug overdose Alcohol abuse with evidence of withdrawal-improved Aspiration pneumonia-completed course of Unasyn 7 days   SECONDARY DIAGNOSIS:   Past Medical History  Diagnosis Date  . Arthritis     HOSPITAL COURSE:  Duane Hayes  is a 55 y.o. male admitted 01/21/2016 in unresponsive state. Please see H&P performed by Dr. Sheryle Hail for further information. The patient was originally found unresponsive in a motel covered and ice but the emergency department further workup and evaluation subsequently intubated. He had an extended stay in the ICU was able to be weaned from the ventilator 01/27/16. He continued to have issues with agitation requiring Precedex drip eventually weaned off still requiring high dose of Ativan and Haldol. The patient was examined by psychiatry who deemed may benefit from further treatment. During his hospital stay and there is concern for potential aspiration pneumonia completed 7 day course of Unasyn and no longer has respiratory issues Evaluated by physical therapy who deemed he would benefit from continued therapy and should walk either with assistance or rolling walker  The patient has been accepted by behavioral health for continuation of treatment  DISCHARGE CONDITIONS:   Stable/improved  CONSULTS OBTAINED:  Treatment Team:  Audery Amel, MD  DRUG ALLERGIES:  No Known Allergies  DISCHARGE MEDICATIONS:  There are no discharge medications for this patient.    DISCHARGE INSTRUCTIONS:     DIET:  Regular diet  DISCHARGE CONDITION:  Stable  ACTIVITY:  Activity as tolerated  OXYGEN:  Home Oxygen: No.   Oxygen Delivery: room air  DISCHARGE LOCATION:  [facility] unit   If you experience worsening of your admission symptoms, develop shortness of breath, life threatening emergency, suicidal or homicidal thoughts you must seek medical attention immediately by calling 911 or calling your MD immediately  if symptoms less severe.  You Must read complete instructions/literature along with all the possible adverse reactions/side effects for all the Medicines you take and that have been prescribed to you. Take any new Medicines after you have completely understood and accpet all the possible adverse reactions/side effects.   Please note  You were cared for by a hospitalist during your hospital stay. If you have any questions about your discharge medications or the care you received while you were in the hospital after you are discharged, you can call the unit and asked to speak with the hospitalist on call if the hospitalist that took care of you is not available. Once you are discharged, your primary care physician will handle any further medical issues. Please note that NO REFILLS for any discharge medications will be authorized once you are discharged, as it is imperative that you return to your primary care physician (or establish a relationship with a primary care physician if you do not have one) for your aftercare needs so that they can reassess your need for medications and monitor your lab values.    On the day of Discharge:   VITAL SIGNS:  Blood pressure 105/78, pulse 98, temperature 97.9 F (36.6 C), temperature source Oral, resp.  rate 18, height  (1.727 m), weight 61.4 kg (135 lb 5.8 oz), SpO2 100 %.  I/O:  No intake or output data in the 24 hours ending 02/07/16 1247  PHYSICAL EXAMINATION:  GENERAL:  55 y.o.-year-old patient lying in the  bed with no acute distress.  EYES: Pupils equal, round, reactive to light and accommodation. No scleral icterus. Extraocular muscles intact.  HEENT: Head atraumatic, normocephalic. Oropharynx and nasopharynx clear.  NECK:  Supple, no jugular venous distention. No thyroid enlargement, no tenderness.  LUNGS: Normal breath sounds bilaterally, no wheezing, rales,rhonchi or crepitation. No use of accessory muscles of respiration.  CARDIOVASCULAR: S1, S2 normal. No murmurs, rubs, or gallops.  ABDOMEN: Soft, non-tender, non-distended. Bowel sounds present. No organomegaly or mass.  EXTREMITIES: No pedal edema, cyanosis, or clubbing.  NEUROLOGIC: Cranial nerves II through XII are intact. Muscle strength 5/5 in all extremities. Sensation intact. Gait not checked.  PSYCHIATRIC: The patient is alert and oriented x 3.  SKIN: No obvious rash, lesion, or ulcer.   DATA REVIEW:   CBC  Recent Labs Lab 02/01/16 0432  WBC 11.5*  HGB 11.5*  HCT 33.9*  PLT 426    Chemistries   Recent Labs Lab 02/02/16 0509  NA 137  K 4.8  CL 109  CO2 21*  GLUCOSE 319*  BUN 7  CREATININE 0.67  CALCIUM 8.1*  MG 1.8    Cardiac Enzymes No results for input(s): TROPONINI in the last 168 hours.  Microbiology Results  Results for orders placed or performed during the hospital encounter of 01/21/16  Blood culture (routine x 2)     Status: None   Collection Time: 01/21/16  4:20 AM  Result Value Ref Range Status   Specimen Description BLOOD RIGHT ANTECUBITAL  Final   Special Requests BOTTLES DRAWN AEROBIC AND ANAEROBIC  Final   Culture NO GROWTH 6 DAYS  Final   Report Status 01/27/2016 FINAL  Final  Blood culture (routine x 2)     Status: None   Collection Time: 01/21/16  4:20 AM  Result Value Ref Range Status   Specimen Description BLOOD LEFT WRIST  Final   Special Requests BOTTLES DRAWN AEROBIC AND ANAEROBIC  Final   Culture NO GROWTH 6 DAYS  Final   Report Status 01/27/2016 FINAL  Final  MRSA  PCR Screening     Status: None   Collection Time: 01/21/16  8:51 AM  Result Value Ref Range Status   MRSA by PCR NEGATIVE NEGATIVE Final    Comment:        The GeneXpert MRSA Assay (FDA approved for NASAL specimens only), is one component of a comprehensive MRSA colonization surveillance program. It is not intended to diagnose MRSA infection nor to guide or monitor treatment for MRSA infections.   Urine culture     Status: None   Collection Time: 01/23/16  6:09 PM  Result Value Ref Range Status   Specimen Description URINE, CATHETERIZED  Final   Special Requests Normal  Final   Culture NO GROWTH 1 DAY  Final   Report Status 01/25/2016 FINAL  Final  Culture, expectorated sputum-assessment     Status: None   Collection Time: 01/25/16  2:57 PM  Result Value Ref Range Status   Specimen Description SPUTUM  Final   Special Requests NONE  Final   Sputum evaluation THIS SPECIMEN IS ACCEPTABLE FOR SPUTUM CULTURE  Final   Report Status 01/25/2016 FINAL  Final  Culture, respiratory (NON-Expectorated)  Status: None   Collection Time: 01/25/16  2:57 PM  Result Value Ref Range Status   Specimen Description SPUTUM  Final   Special Requests NONE Reflexed from [id number]  Final   Gram Stain   Final    MODERATE WBC SEEN RARE SQUAMOUS EPITHELIAL CELLS PRESENT RARE GRAM POSITIVE COCCI FEW GRAM NEGATIVE RODS    Culture MODERATE GROWTH ESCHERICHIA COLI  Final   Report Status 01/27/2016 FINAL  Final   Organism ID, Bacteria ESCHERICHIA COLI  Final      Susceptibility   Escherichia coli - MIC*    AMPICILLIN Value in next row Resistant      RESISTANT>=32    CEFAZOLIN Value in next row Sensitive      SENSITIVE<=4    CEFEPIME Value in next row Sensitive      SENSITIVE<=1    CEFTAZIDIME Value in next row Sensitive      SENSITIVE<=1    CEFTRIAXONE Value in next row Sensitive      SENSITIVE<=1    CIPROFLOXACIN Value in next row Sensitive      SENSITIVE<=0.25    GENTAMICIN Value in next row  Sensitive      SENSITIVE<=1    IMIPENEM Value in next row Sensitive      SENSITIVE<=0.25    TRIMETH/SULFA Value in next row Resistant      RESISTANT>=320    AMPICILLIN/SULBACTAM Value in next row Sensitive      SENSITIVE4    PIP/TAZO Value in next row Sensitive      SENSITIVE<=4    Extended ESBL Value in next row Sensitive      SENSITIVE<=4    * MODERATE GROWTH ESCHERICHIA COLI    RADIOLOGY:  No results found.   Management plans discussed with the patient, family and they are in agreement.  CODE STATUS:     Code Status Orders        Start     Ordered   01/21/16 0853  Full code   Continuous     01/21/16 0852    Code Status History    Date Active Date Inactive Code Status Order ID Comments User Context   This patient has a current code status but no historical code status.      TOTAL TIME TAKING CARE OF THIS PATIENT: 35 minutes.    Ondre Salvetti,  [name].D on 02/07/2016 at 12:47 PM  Between 7am to 6pm - Pager - [phone]  After 6pm go to [email or web address] - password [email or web address] [facility]  [place] [facility]  Office  [phone]  CC: Primary care physician; No PCP Per Patient

## 2016-02-08 MED ORDER — TRAZODONE HCL 50 MG PO TABS
150.0000 mg | ORAL_TABLET | Freq: Every day | ORAL | Status: DC
  Administered 2016-02-08 – 2016-02-11 (×4): 150 mg via ORAL
  Filled 2016-02-08 (×4): qty 3

## 2016-02-08 MED ORDER — ENSURE ENLIVE PO LIQD
237.0000 mL | Freq: Two times a day (BID) | ORAL | Status: DC
  Administered 2016-02-09 – 2016-02-12 (×2): 237 mL via ORAL

## 2016-02-08 MED ORDER — LIDOCAINE 5 % EX PTCH
1.0000 | MEDICATED_PATCH | CUTANEOUS | Status: DC
  Administered 2016-02-08 – 2016-02-11 (×4): 1 via TRANSDERMAL
  Filled 2016-02-08 (×6): qty 1

## 2016-02-08 NOTE — Progress Notes (Signed)
Recreation Therapy Notes  INPATIENT RECREATION THERAPY ASSESSMENT  Patient Details Name: LENDELL GALLICK MRN: [id number] DOB: January 07, 1961 Today's Date: 02/15/2016  Patient Stressors: Death, Other (Comment) (Fiance died in 2023-12-01; he has no where to go)  Coping Skills:   Substance Abuse, Avoidance, Exercise, Art/Dance, Music  Personal Challenges: Anger, Concentration, Self-Esteem/Confidence, Stress Management, Substance Abuse, Time Management, Trusting Others  Leisure Interests (2+):  Individual - Other (Comment) (Working on cars, playing with grandchildren)  Awareness of Community Resources:  Yes  Community Resources:  Park, Other (Comment) Health and safety inspector)  Current Use: Yes  If no, Barriers?:    Patient Strengths:  "I'm me"  Patient Identified Areas of Improvement:  Learn not to do drugs, quit drinking  Current Recreation Participation:  Drinking  Patient Goal for Hospitalization:  "I don't know"  [place] of Residence:  [place] of Residence:  [place]   Current SI (including self-harm):  No  Current HI:  No  Consent to Intern Participation: N/A   Jacquelynn Cree, LRT/CTRS 2016-02-15, 1:33 PM

## 2016-02-08 NOTE — [facility] Group Notes (Signed)
[facility] Group Notes:  (Nursing/MHT/Case Management/Adjunct)  Date:  02/08/2016  Time:  10:03 PM  Type of Therapy:  Group Therapy  Participation Level:  Active  Participation Quality:  Appropriate and Attentive  Affect:  Appropriate  Cognitive:  Alert and Appropriate  Insight:  Appropriate  Engagement in Group:  Engaged  Modes of Intervention:  Discussion  Summary of Progress/Problems:  Duane Hayes Duane Hayes 02/08/2016, 10:03 PM

## 2016-02-08 NOTE — Progress Notes (Signed)
Initial Nutrition Assessment     INTERVENTION:   Medical Food Supplement Therapy: pt previously receiving Ensure; recommend sending BID On meal trays Meals and Snacks: Cater to patient preferences  NUTRITION DIAGNOSIS:   Reassess on follow-up  GOAL:   Patient will meet greater than or equal to 90% of their needs  MONITOR:    (Energy Intake, Digestive System, Anthropometrics)  REASON FOR ASSESSMENT:    (Pressure Ulcer)    ASSESSMENT:    Pt presented to ED after being found unresponsive s/p overdose, pt was on vent from 1/27 to 2/7. Pt admitted to behavioral medicine on 2/13 for substance induced mood disorder. Pt initially very weak, confused. Improving, 1:1 sitter discontinued  Past Medical History  Diagnosis Date  . Arthritis     Diet Order:  Diet regular Room service appropriate?: Yes; Fluid consistency:: Thin   Energy Intake: pt ate 50% of meals today; pt on TF while intubated in ICU; on regular mechanical soft diet post extubation  Skin:   (stage II pressure ulcer)   Recent Labs Lab 02/02/16 0509  NA 137  K 4.8  CL 109  CO2 21*  BUN 7  CREATININE 0.67  CALCIUM 8.1*  MG 1.8  PHOS 4.1  GLUCOSE 319*   Meds: reviewed  Height:   Ht Readings from Last 1 Encounters:  02/07/16  (1.753 m)    Weight:   Wt Readings from Last 1 Encounters:  02/07/16 134 lb (60.782 kg)   BMI:  Body mass index is 19.78 kg/(m^2).  LOW Care Level  Duane Starcher MS, [place], LDN [phone] Pager  [phone] Weekend/On-Call Pager

## 2016-02-08 NOTE — Plan of Care (Signed)
Problem: Alteration in mood & ability to function due to Goal: STG-Patient will attend groups Outcome: Progressing Pt attended evening wrap up group  Problem: Diagnosis: Increased Risk For Suicide Attempt Goal: STG-Patient Will Comply With Medication Regime Outcome: Progressing Pt taking medications as prescribed  Problem: Alteration in mood Goal: LTG-Patient reports reduction in suicidal thoughts (Patient reports reduction in suicidal thoughts and is able to verbalize a safety plan for whenever patient is feeling suicidal)  Outcome: Progressing Pt denies SI at this time

## 2016-02-08 NOTE — [facility] Group Notes (Signed)
[facility] Group Notes:  (Nursing/MHT/Case Management/Adjunct)  Date:  02/08/2016  Time:  1:52 PM  Type of Therapy:  Psychoeducational Skills  Participation Level:  Active  Participation Quality:  Appropriate  Affect:  Appropriate  Cognitive:  Appropriate  Insight:  Appropriate  Engagement in Group:  Engaged  Modes of Intervention:  Discussion and Education  Summary of Progress/Problems:  Mickey Farber 02/08/2016, 1:52 PM

## 2016-02-08 NOTE — Tx Team (Signed)
Interdisciplinary Treatment Plan Update (Adult)  Date:  02/08/2016 Time Reviewed:  4:10 PM  Progress in Treatment: Attending groups: Yes. Participating in groups:  Yes. Taking medication as prescribed:  Yes. Tolerating medication:  Yes. Family/Significant othe contact made:  Yes, individual(s) contacted:  [name] Patient understands diagnosis:  Yes. and As evidenced by:  requesting referral to [facility] Discussing patient identified problems/goals with staff:  Yes. Medical problems stabilized or resolved:  Yes. Denies suicidal/homicidal ideation: Yes. Issues/concerns per patient self-inventory:  No. Other:  New problem(s) identified: No, Describe:     Discharge Plan or Barriers:  Pt requests referral to [facility], which is reccommended given the seriousness of his overdose and that this is not the first time.   Reason for Continuation of Hospitalization: Anxiety Depression Medication stabilization Withdrawal symptoms  Comments: Referral made to [facility] 02/08/2016  Estimated length of stay: 3-5 days  New goal(s):  Review of initial/current patient goals per problem list:   1.  Goal(s):Pt will participate in aftercare plan.  Met:  No  Target date:at discharge  As evidenced by: Plan for housing and psychiatric follow up being identified.  2.  Goal (s):Pt will exhibit decreased depressive symptoms and suicidal ideation.  Met:  No  Target date:at discharge  As evidenced by:Pt will utilize self-rating of depression at 3 or below and demonstrate decreased signs of depression OR be deemed stable by MD.  3.  Goal(s):Patient will demonstrate decreased signs of withdrawal due to substance abuse.  Met:  No  Target date:  As evidenced PG:[id number] will produce a CIWA/COWS score of 0, have stable vital signs, and no symptoms of withdrawal.  Attendees: Patient:  Duane Hayes 2/14/20174:10 PM  Family:   2/14/20174:10 PM  Physician:  Jerilee Hoh 2/14/20174:10 PM   Nursing:   Terressa Koyanagi, RN 2/14/20174:10 PM  Case Manager:   2/14/20174:10 PM  Counselor:  Dossie Arbour, LCSW 2/14/20174:10 PM  Other:  Everitt Amber, [place] 2/14/20174:10 PM  Other:   2/14/20174:10 PM  Other:   2/14/20174:10 PM  Other:  2/14/20174:10 PM  Other:  2/14/20174:10 PM  Other:  2/14/20174:10 PM  Other:  2/14/20174:10 PM  Other:  2/14/20174:10 PM  Other:  2/14/20174:10 PM  Other:   2/14/20174:10 PM   Scribe for Treatment Team:   August Saucer, 02/08/2016, 4:10 PM, MSW, LCSW

## 2016-02-08 NOTE — Progress Notes (Addendum)
D: Pt is seen in the milieu interacting appropriately with peers and staff. Pt did attend evening wrap up group. Pt affect is flat and sad. Denies SI/HI/AVH at this time. Pt c/o chronic back pain which he rates an 8 out of 10. Pt requests PRN medication for pain. Pt gait appears to be improving. A: Emotional support and encouragement provided. Medications administered as prescribed. q15 minute safety checks maintained. Pt remains on 1:1 with safety sitter and video monitor. R: Pt remains free from harm.

## 2016-02-08 NOTE — Plan of Care (Signed)
Problem: Diagnosis: Increased Risk For Suicide Attempt Goal: LTG-Patient Will Report Improved Mood and Deny Suicidal LTG (by discharge) Patient will report improved mood and deny suicidal ideation.  Outcome: Progressing Denies suicidal ideations.     

## 2016-02-08 NOTE — Progress Notes (Signed)
Recreation Therapy Notes  Date: 02.14.17 Time: 3:00 pm Location: Community Room  Group Topic: Goal Setting  Goal Area(s) Addresses:  Patient will write at least one goal. Patient will write at least one obstacle.  Behavioral Response: Attentive  Intervention: Recovery Goal Chart  Activity: Patients were instructed to make a Recovery Goal Chart including goals, obstacles, date goals started, and date goals achieved.  Education: LRT educated patients on ways they can overcome their obstacles.  Education Outcome: In group clarification offered  Clinical Observations/Feedback: Patient completed on activity. Patient did not contribute to group discussion.  Jacquelynn Cree, LRT/CTRS 02/08/2016 4:15 PM

## 2016-02-08 NOTE — Progress Notes (Addendum)
[facility] MD Progress Note  02/08/2016 2:29 PM KHOURY SIEMON  MRN:  [id number] Subjective:  Per staff patient no longer is unsteady on his feet. He is not longer confused. Overnight he did not have any episodes of confusion or aggression. One-to-one sitter has been discontinued. Gait is normal and rolling walker is not needed any more.  Patient tells me he does want to go to rehabilitation for substance abuse at discharge. He tells me that he has 2 pending charges in different counties for DWIs.  He worries about his court dates.  Per patient's sister Gavin Pound court dates have been moved one event is in the middle of March and the other one is for later on in the spring.  She reports that the patient's behaviors have advised him to go to rehabilitation for substance abuse. Also the patient's family strongly supports patient going to rehabilitation.  Today the patient tells me that overdose was not a suicidal attempt. He denies having depression right now. Denies suicidality, homicidality or having auditory or visual hallucinations. Patient's biggest concern is feeling anxious related to worrying about the charges pending. He denies side effects from medications. He denies having any physical complaints.  Parents that he has been compliant, he has been calm, cooperative. No evidence of aggression or agitation.  Per nursing: D: Pt is seen in the milieu interacting appropriately with peers and staff. Pt did attend evening wrap up group. Pt affect is flat and sad. Denies SI/HI/AVH at this time. Pt c/o chronic back pain which he rates an 8 out of 10. Pt requests PRN medication for pain. Pt gait appears to be improving. A: Emotional support and encouragement provided. Medications administered as prescribed. q15 minute safety checks maintained. Pt remains on 1:1 with safety sitter and video monitor. R: Pt remains free from harm.  Principal Problem: Substance induced mood disorder (HCC) Diagnosis:   Patient  Active Problem List   Diagnosis Date Noted  . Tobacco use disorder [F17.200] 02/07/2016  . Opioid use disorder, severe, dependence (HCC) [F11.20] 02/07/2016  . Cannabis use disorder, severe, dependence (HCC) [F12.20] 02/07/2016  . Alcohol use disorder, severe, dependence (HCC) [F10.20] 02/07/2016  . Stimulant use disorder (HCC) [F15.90] 02/07/2016  . Substance induced mood disorder [facility]) [F19.94] 02/07/2016   Total Time spent with patient: 30 minutes  Past Psychiatric History: Patient denies that he was being treated for any mental health problems. He said that he had 1 suicide attempt 30 or 40 years ago by cutting his wrist and he was in a psychiatric hospital at that time. He doesn't know any other details. Doesn't report any other suicidal behavior since then and claims to have no suicidal thoughts at all now.   Past Medical History:               Family History:Patient states that both his mother and father "didn't understand life too well". I couldn't get him to be any more specific than that. He says he's the only person in his family with a drug problem.  Social History: Patient tells me that he lives in a house with his ex-wife. He remembers her name being Rosann Auerbach, but cannot remember a phone number for her. The only phone number I could find in the chart was of his sister but when I called that number I was not able to leave a message because the mailbox was full       Past Medical History:  Past Medical History  Diagnosis Date  .  Arthritis     Past Surgical History  Procedure Laterality Date  . Shoulder surgery     Family History: History reviewed. No pertinent family history.  Social History:  History  Alcohol Use  . Yes     History  Drug Use  . Yes  . Special: Cocaine    Social History   Social History  . Marital Status: Widowed    Spouse Name: N/A  . Number of Children: N/A  . Years of Education: N/A   Social History Main Topics  . Smoking  status: Current Every Day Smoker -- 1.00 packs/day    Types: Cigarettes  . Smokeless tobacco: None  . Alcohol Use: Yes  . Drug Use: Yes    Special: Cocaine  . Sexual Activity: Not Asked   Other Topics Concern  . None   Social History Narrative    Sleep: Good  Appetite:  Good  Current Medications: Current Facility-Administered Medications  Medication Dose Route Frequency Provider Last Rate Last Dose  . acetaminophen (TYLENOL) tablet 650 mg  650 mg Oral Q6H PRN Jimmy Footman, MD   650 mg at 02/08/16 0931  . alum & mag hydroxide-simeth (MAALOX/MYLANTA) 200-200-20 MG/5ML suspension 30 mL  30 mL Oral Q4H PRN Jimmy Footman, MD      . diphenhydrAMINE (BENADRYL) capsule 50 mg  50 mg Oral Q8H PRN Jimmy Footman, MD       Or  . diphenhydrAMINE (BENADRYL) injection 50 mg  50 mg Intramuscular Q8H PRN Jimmy Footman, MD      . haloperidol (HALDOL) tablet 0.5 mg  0.5 mg Oral TID Jimmy Footman, MD   0.5 mg at 02/08/16 0931  . haloperidol (HALDOL) tablet 5 mg  5 mg Oral Q8H PRN Jimmy Footman, MD       Or  . haloperidol lactate (HALDOL) injection 5 mg  5 mg Intramuscular Q8H PRN Jimmy Footman, MD      . lidocaine (LIDODERM) 5 % 1 patch  1 patch Transdermal Q24H Jimmy Footman, MD   1 patch at 02/08/16 1258  . magnesium hydroxide (MILK OF MAGNESIA) suspension 30 mL  30 mL Oral Daily PRN Jimmy Footman, MD      . nicotine (NICODERM CQ - dosed in mg/24 hours) patch 21 mg  21 mg Transdermal Daily Jimmy Footman, MD   21 mg at 02/08/16 0931  . traZODone (DESYREL) tablet 150 mg  150 mg Oral QHS Jimmy Footman, MD        Lab Results:  Results for orders placed or performed during the hospital encounter of 01/21/16 (from the past 48 hour(s))  Glucose, capillary     Status: Abnormal   Collection Time: 02/06/16  4:25 PM  Result Value Ref Range   Glucose-Capillary 101 (H) 65 - 99  mg/dL   Comment 1 Notify RN   Glucose, capillary     Status: None   Collection Time: 02/06/16  9:24 PM  Result Value Ref Range   Glucose-Capillary 80 65 - 99 mg/dL   Comment 1 Notify RN   Glucose, capillary     Status: Abnormal   Collection Time: 02/07/16  7:49 AM  Result Value Ref Range   Glucose-Capillary 104 (H) 65 - 99 mg/dL   Comment 1 Notify RN   Glucose, capillary     Status: Abnormal   Collection Time: 02/07/16 11:11 AM  Result Value Ref Range   Glucose-Capillary 103 (H) 65 - 99 mg/dL   Comment 1 Notify RN  Physical Findings: AIMS: Facial and Oral Movements Muscles of Facial Expression: None, normal Lips and Perioral Area: None, normal Jaw: None, normal Tongue: None, normal,Extremity Movements Upper (arms, wrists, hands, fingers): None, normal Lower (legs, knees, ankles, toes): None, normal, Trunk Movements Neck, shoulders, hips: None, normal, Overall Severity Severity of abnormal movements (highest score from questions above): None, normal Incapacitation due to abnormal movements: None, normal Patient's awareness of abnormal movements (rate only patient's report): No Awareness, Dental Status Current problems with teeth and/or dentures?: No Does patient usually wear dentures?: No  CIWA:  CIWA-Ar Total: 3 COWS:  COWS Total Score: 1  Musculoskeletal: Strength & Muscle Tone: within normal limits Gait & Station: normal Patient leans: N/A  Psychiatric Specialty Exam: Review of Systems  Constitutional: Negative.   HENT: Negative.   Eyes: Negative.   Respiratory: Negative.   Gastrointestinal: Negative.   Genitourinary: Negative.   Musculoskeletal: Positive for back pain.  Skin: Negative.   Neurological: Negative.   Endo/Heme/Allergies: Negative.   Psychiatric/Behavioral: Positive for substance abuse. Negative for depression, suicidal ideas, hallucinations and memory loss. The patient has insomnia. The patient is not nervous/anxious.     Blood pressure  120/96, pulse 100, temperature 97.9 F (36.6 C), temperature source Oral, resp. rate 18, height 5\' 9"  (1.753 m), weight 60.782 kg (134 lb), SpO2 99 %.Body mass index is 19.78 kg/(m^2).  General Appearance: Fairly Groomed  Patent attorney::  Good  Speech:  Clear and Coherent  Volume:  Normal  Mood:  Anxious  Affect:  Appropriate  Thought Process:  Logical  Orientation:  Full (Time, Place, and Person)  Thought Content:  Hallucinations: None  Suicidal Thoughts:  No  Homicidal Thoughts:  No  Memory:  Immediate;   Good Recent;   Good Remote;   Good  Judgement:  Fair  Insight:  Fair  Psychomotor Activity:  Decreased  Concentration:  Good  Recall:  Good  Fund of Knowledge:Good  Language: Good  Akathisia:  No  Handed:    AIMS (if indicated):     Assets:  Manufacturing systems engineer Physical Health  ADL's:  Intact  Cognition: WNL  Sleep:  Number of Hours: 8   Treatment Plan Summary: Daily contact with patient to assess and evaluate symptoms and progress in treatment and Medication management   55 year old Caucasian male with known history of opiate, alcohol, cocaine and cannabis dependent 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 Per nurse's information to other people were found unresponsive at the same location.  Per review of records looks like the patient does not have a prior history of mental illness. He does have a documented history of alcohol and opiate addiction.  Patient says he has been in rehabilitation a multitude of times. Patient also reports criminal behavior. Says he has been in prison a multitude of times and has completed rehabilitation while in prison.  Patient was confused yesterday but today he is fully alert and oriented.  Per staff they have not witnessed any waxing or waning since last night.   Anxiety: for now continue haldol 0.5 mg q 8 h.  For delirium: (Delirium resolved)  Agitation: I will order Haldol 5 mg every 8 hours IM or by  mouth along with Benadryl 50 mg every 8 hours IM or by mouth. We will avoid the use of benzodiazepines.  No agitation or aggression since admission  Insomnia: Continue trazodone  But will increase to 150 mg by mouth daily at bedtime  Tobacco use disorder: Patient will  receive nicotine patch 21 mg  Alcohol, opiate withdrawal:had delirium tremens while on the medical floor. At this point no evidence of withdrawal  Alcohol-cocaine-heroin-cannabis dependence: Currently patient says he is not interested in inpatient treatment for substance abuse. We plan to refer to outpatient substance abuse treatment once stable.  Chronic back pain: will order lidoderm patch  Unsteady gait: No longer has unsteady gait. No longer needing ongoing walker. No longer on one-to-one.   Precautions we'll change to every 15 minute checks  Diet regular  Referral made to [facility] today.  Jimmy Footman, MD 02/08/2016, 2:29 PM

## 2016-02-08 NOTE — Progress Notes (Signed)
Patient ambulates with a steady gait.Denies suicidal ideations & AV hallucinations.Stated that he is anxious about the pending charges.Attended groups.Appropriate with staff & peers.Compliant with medications.

## 2016-02-09 NOTE — [facility] Group Notes (Signed)
[facility] Group Notes:  (Nursing/MHT/Case Management/Adjunct)  Date:  02/09/2016  Time:  1:17 PM  Type of Therapy:  Group Therapy  Participation Level:  Active  Participation Quality:  Supportive  Affect:  Appropriate  Cognitive:  Appropriate  Insight:  Good  Engagement in Group:  Engaged  Modes of Intervention:  Activity  Summary of Progress/Problems:  Duane Hayes 02/09/2016, 1:17 PM

## 2016-02-09 NOTE — Progress Notes (Addendum)
[facility] MD Progress Note  02/09/2016 8:56 AM Duane Hayes  MRN:  [id number] Subjective: no issues overnight per nursing  Patient tells me he WANTS to go to rehabilitation for substance abuse at discharge. He tells me that he has 2 pending charges in different counties for DWIs.  He worries about his court dates.  Per patient's sister Gavin Pound court dates have been moved one event is in the middle of March and the other one is for later on in the spring.  She reports that the patient's LAWYERS have advised him to go to rehabilitation for substance abuse. Also the patient's family strongly supports patient going to rehabilitation.  Today the patient tells me he is doing ok. He denies having depression right now. Denies suicidality, homicidality or having auditory or visual hallucinations. Patient's biggest concern is feeling anxious related to worrying about the charges pending. He denies side effects from medications. He denies having any physical complaints.  Parents that he has been compliant, he has been calm, cooperative. No evidence of aggression or agitation.  Per nursing: D: Patient denies SI/HI/AVH. Patient affect and mood are anxious. Patient did attend evening group. Patient visible on the milieu. Patient interaction with staff and other patients was appropriate. No distress noted. A: Support and encouragement offered. Scheduled medications given to pt. Q 15 min checks continued for patient safety. R: Patient receptive. Patient remains safe on the unit.   Principal Problem: Substance induced mood disorder (HCC) Diagnosis:   Patient Active Problem List   Diagnosis Date Noted  . Tobacco use disorder [F17.200] 02/07/2016  . Opioid use disorder, severe, dependence (HCC) [F11.20] 02/07/2016  . Cannabis use disorder, severe, dependence (HCC) [F12.20] 02/07/2016  . Alcohol use disorder, severe, dependence (HCC) [F10.20] 02/07/2016  . Stimulant use disorder (HCC) [F15.90] 02/07/2016  .  Substance induced mood disorder [facility]) [F19.94] 02/07/2016   Total Time spent with patient: 30 minutes  Past Psychiatric History: Patient denies that he was being treated for any mental health problems. He said that he had 1 suicide attempt 30 or 40 years ago by cutting his wrist and he was in a psychiatric hospital at that time. He doesn't know any other details. Doesn't report any other suicidal behavior since then and claims to have no suicidal thoughts at all now.   Past Medical History:               Family History:Patient states that both his mother and father "didn't understand life too well". I couldn't get him to be any more specific than that. He says he's the only person in his family with a drug problem.  Social History: Patient tells me that he lives in a house with his ex-wife. He remembers her name being Rosann Auerbach, but cannot remember a phone number for her. The only phone number I could find in the chart was of his sister but when I called that number I was not able to leave a message because the mailbox was full       Past Medical History:  Past Medical History  Diagnosis Date  . Arthritis     Past Surgical History  Procedure Laterality Date  . Shoulder surgery     Family History: History reviewed. No pertinent family history.  Social History:  History  Alcohol Use  . Yes     History  Drug Use  . Yes  . Special: Cocaine    Social History   Social History  . Marital Status:  Widowed    Spouse Name: N/A  . Number of Children: N/A  . Years of Education: N/A   Social History Main Topics  . Smoking status: Current Every Day Smoker -- 1.00 packs/day    Types: Cigarettes  . Smokeless tobacco: None  . Alcohol Use: Yes  . Drug Use: Yes    Special: Cocaine  . Sexual Activity: Not Asked   Other Topics Concern  . None   Social History Narrative    Sleep: Good  Appetite:  Good  Current Medications: Current Facility-Administered Medications   Medication Dose Route Frequency Provider Last Rate Last Dose  . acetaminophen (TYLENOL) tablet 650 mg  650 mg Oral Q6H PRN Jimmy Footman, MD   650 mg at 02/08/16 0931  . alum & mag hydroxide-simeth (MAALOX/MYLANTA) 200-200-20 MG/5ML suspension 30 mL  30 mL Oral Q4H PRN Jimmy Footman, MD      . feeding supplement (ENSURE ENLIVE) (ENSURE ENLIVE) liquid 237 mL  237 mL Oral BID WC Jimmy Footman, MD   237 mL at 02/08/16 1700  . haloperidol (HALDOL) tablet 0.5 mg  0.5 mg Oral TID Jimmy Footman, MD   0.5 mg at 02/08/16 2210  . lidocaine (LIDODERM) 5 % 1 patch  1 patch Transdermal Q24H Jimmy Footman, MD   1 patch at 02/08/16 1258  . magnesium hydroxide (MILK OF MAGNESIA) suspension 30 mL  30 mL Oral Daily PRN Jimmy Footman, MD      . nicotine (NICODERM CQ - dosed in mg/24 hours) patch 21 mg  21 mg Transdermal Daily Jimmy Footman, MD   21 mg at 02/08/16 0931  . traZODone (DESYREL) tablet 150 mg  150 mg Oral QHS Jimmy Footman, MD   150 mg at 02/08/16 2210    Lab Results:  Results for orders placed or performed during the hospital encounter of 01/21/16 (from the past 48 hour(s))  Glucose, capillary     Status: Abnormal   Collection Time: 02/07/16 11:11 AM  Result Value Ref Range   Glucose-Capillary 103 (H) 65 - 99 mg/dL   Comment 1 Notify RN     Physical Findings: AIMS: Facial and Oral Movements Muscles of Facial Expression: None, normal Lips and Perioral Area: None, normal Jaw: None, normal Tongue: None, normal,Extremity Movements Upper (arms, wrists, hands, fingers): None, normal Lower (legs, knees, ankles, toes): None, normal, Trunk Movements Neck, shoulders, hips: None, normal, Overall Severity Severity of abnormal movements (highest score from questions above): None, normal Incapacitation due to abnormal movements: None, normal Patient's awareness of abnormal movements (rate only patient's  report): No Awareness, Dental Status Current problems with teeth and/or dentures?: No Does patient usually wear dentures?: No  CIWA:  CIWA-Ar Total: 3 COWS:  COWS Total Score: 1  Musculoskeletal: Strength & Muscle Tone: within normal limits Gait & Station: normal Patient leans: N/A  Psychiatric Specialty Exam: Review of Systems  Constitutional: Negative.   HENT: Negative.   Eyes: Negative.   Respiratory: Negative.   Gastrointestinal: Negative.   Genitourinary: Negative.   Musculoskeletal: Positive for back pain.  Skin: Negative.   Neurological: Negative.   Endo/Heme/Allergies: Negative.   Psychiatric/Behavioral: Positive for substance abuse. Negative for depression, suicidal ideas, hallucinations and memory loss. The patient is not nervous/anxious and does not have insomnia.     Blood pressure 113/97, pulse 103, temperature 98.6 F (37 C), temperature source Oral, resp. rate 18, height  (1.753 m), weight 60.782 kg (134 lb), SpO2 99 %.Body mass index is 19.78 kg/(m^2).  General Appearance: Fairly  Groomed  Patent attorney::  Good  Speech:  Clear and Coherent  Volume:  Normal  Mood:  Anxious  Affect:  Appropriate  Thought Process:  Logical  Orientation:  Full (Time, Place, and Person)  Thought Content:  Hallucinations: None  Suicidal Thoughts:  No  Homicidal Thoughts:  No  Memory:  Immediate;   Good Recent;   Good Remote;   Good  Judgement:  Fair  Insight:  Fair  Psychomotor Activity:  Decreased  Concentration:  Good  Recall:  Good  Fund of Knowledge:Good  Language: Good  Akathisia:  No  Handed:    AIMS (if indicated):     Assets:  Manufacturing systems engineer Physical Health  ADL's:  Intact  Cognition: WNL  Sleep:  Number of Hours: 8   Treatment Plan Summary: Daily contact with patient to assess and evaluate symptoms and progress in treatment and Medication management   56 year old Caucasian male with known history of opiate, alcohol, cocaine and cannabis dependent  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 Per nurse's information to other people were found unresponsive at the same location.  Per review of records looks like the patient does not have a prior history of mental illness. He does have a documented history of alcohol and opiate addiction.  Patient says he has been in rehabilitation a multitude of times. Patient also reports criminal behavior. Says he has been in prison a multitude of times and has completed rehabilitation while in prison.  Patient was confused upon admission but no confusion over the last 2 days.  Anxiety: for now continue haldol 0.5 mg q 8 h.  For delirium: (Delirium resolved)  Agitation:No agitation or aggression since admission  Insomnia: Continue trazodone 150 mg by mouth daily at bedtime  Tobacco use disorder: Patient will receive nicotine patch 21 mg  Alcohol, opiate withdrawal:had delirium tremens while on the medical floor. At this point no evidence of withdrawal  Alcohol-cocaine-heroin-cannabis dependence: Currently patient says he is not interested in inpatient treatment for substance abuse. We plan to refer to outpatient substance abuse treatment once stable.  Chronic back pain: doing well with lidoderm patch.  Unsteady gait: Resolved. No longer has unsteady gait. No longer needing walker. No longer on one-to-one.  Discharge from PT---not longer needed  NO DECUBITUS ulcers during skin check upon admission.   Precautions continue q 19m checks  Diet regular  Referral made to [facility] yesterday  Jimmy Footman, MD 02/09/2016, 8:56 AM

## 2016-02-09 NOTE — [facility] Group Notes (Signed)
[facility] LCSW Group Therapy  02/09/2016 2:56 PM  Patient was called to group and did not attend.  Jenel Lucks, CSW Intern 02/09/2016, 2:56 PM  Beryl Meager, MSW, LCSWA 02/10/2016, 10:24AM

## 2016-02-09 NOTE — Progress Notes (Signed)
D: Patient stated slept fairlast night .Stated appetite is good and energy level  Is normal. Stated concentration is good . Stated on Depression scale 5, hopeless 5    and anxiety 6   .( low 0-10 high) Denies suicidal  homicidal ideations  .  No auditory hallucinations  Compliant of lower back pain . Appropriate ADL'S. Interacting with peers and staff. Voice of placement  Concerns , no where to go at present  . A: Encourage patient participation with unit programming . Instruction  Given on  Medication , verbalize understanding. R: Voice no other concerns. Staff continue to monitor

## 2016-02-09 NOTE — Progress Notes (Signed)
Recreation Therapy Notes  Date: 02.15.17 Time: 3:00 pm Location: Community Room  Group Topic: Self-esteem  Goal Area(s) Addresses:  Patient will write down at least one positive trait. Patient will verbalize benefit of having a healthy self-esteem.  Behavioral Response: Attentive  Intervention: I Am  Activity: Patients were given a worksheet with the letter I and instructed to write as many positive traits about themselves inside the letter.  Education: LRT educated patients on ways they can increase self-esteem.  Education Outcome: In group clarification offered  Clinical Observations/Feedback: Patient completed activity by writing down positive traits. Patient did not contribute to group discussion.  Jacquelynn Cree, LRT/CTRS 02/09/2016 4:14 PM
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
## 2016-02-09 NOTE — Progress Notes (Signed)
D: Patient denies SI/HI/AVH.  Patient affect and mood are anxious.  Patient did attend evening group. Patient visible on the milieu. Patient interaction with staff and other patients was appropriate.  No distress noted. A: Support and encouragement offered. Scheduled medications given to pt. Q 15 min checks continued for patient safety. R: Patient receptive. Patient remains safe on the unit.

## 2016-02-09 NOTE — Plan of Care (Signed)
Problem: [facility] Participation in Recreation Therapeutic Interventions Goal: STG-Patient will demonstrate improved self esteem by identif STG: Self-Esteem - Within 4 treatment sessions, patient will verbalize at least 5 positive affirmation statements in each of 2 treatment sessions to increase self-esteem post d/c.  Outcome: Progressing Treatment Session 1; Completed 1 out of 2: At approximately 12:35 pm, LRT met with patient in patient room. Patient verbalized 5 positive affirmation statements. Patient reported it felt "good". LRT encouraged patient to continue saying positive affirmation statements. Intervention Used: I Am statements  Leonette Monarch, LRT/CTRS 02.15.17 1:31 pm Goal: STG-Patient will identify at least five coping skills for ** STG: Coping Skills - Within 4 treatment sessions, patient will verbalize at least 5 coping skills for substance abuse in each of 2 treatment sessions to decrease substance abuse post d/c.  Outcome: Progressing Treatment Session 1; Completed 1 out of 2: At approximately 12:35 pm, LRT met with patient in patient room. Patient verbalized 5 coping skills for substance abuse. LRT educated patient on leisure and why it is important to implement it into his schedule. LRT educated and provided patient with blank schedules to help him plan his day and try to avoid using substances. LRT educated patient on healthy support systems. Intervention Used: Coping Skills worksheet  Leonette Monarch, LRT/CTRS 02.15.17 1:34 pm

## 2016-02-09 NOTE — BHH Group Notes (Signed)
[facility] LCSW Aftercare Discharge Planning Group Note   02/09/2016 2:30 PM  Participation Quality:  Patient attended group and participated appropriately introducing himself and sharing his SMART goal is to "go to rehab".   Mood/Affect:  Appropriate  Depression Rating:  5  Anxiety Rating:  5  Thoughts of Suicide:  No Will you contract for safety?   NA  Current AVH:  No  Plan for Discharge/Comments:  Go to Rehab for substance abuse Tx  Transportation Means: patient reports he has a lot of family and can find transportation   Supports: family support in place  [place], [place] T, MSW, [employer]

## 2016-02-09 NOTE — Plan of Care (Signed)
Problem: Alteration in mood & ability to function due to Goal: LTG-Pt reports reduction in suicidal thoughts (Patient reports reduction in suicidal thoughts and is able to verbalize a safety plan for whenever patient is feeling suicidal) Outcome: Progressing Denies suicidal ideations     

## 2016-02-09 NOTE — Progress Notes (Signed)
Patient ID: Duane Hayes, male   DOB: 1961/04/16, 55 y.o.   MRN: [id number] Pt referred to [facility] for continuing Substance Abuse Treatment as Pt requests and MD and SW recommend.   Referral sent 02/08/16.  SW called, confirmed receipt.  Pt gives verbal permission to give Pt's son Duane Hayes contact information for unit and Pt's pass code.  SW provided to Pt's son as well as education on what to expect moving forward.  Son requested SW send letter to Pt's attorney as he has a court date.  SW sent letter Per Pt request to fax number [phone] per son's direction.  [facility] Staff. Fumi,  called today requesting [facility] Auth Number for Pt's referral.  SW called them with Auth Number [id number].  She said they would move forward in their review process and notify when bed available.   Jake Shark, MSW, LCSW

## 2016-02-10 MED ORDER — TRAZODONE HCL 150 MG PO TABS: 150.0000 mg | ORAL_TABLET | Freq: Every day | ORAL | Status: AC

## 2016-02-10 MED ORDER — LIDOCAINE 5 % EX PTCH: 1.0000 | MEDICATED_PATCH | CUTANEOUS | Status: AC

## 2016-02-10 NOTE — Plan of Care (Signed)
Problem: [facility] Participation in Recreation Therapeutic Interventions Goal: STG-Patient will demonstrate improved self esteem by identif STG: Self-Esteem - Within 4 treatment sessions, patient will verbalize at least 5 positive affirmation statements in each of 2 treatment sessions to increase self-esteem post d/c.  Outcome: Completed/Met Date Met:  02/10/16 Treatment Session 2; Completed 2 out of 2: At approximately 10:45 am, LRT met with patient in patient room. Patient verbalized 6 positive affirmation statements. Patient reported it felt "good". LRT encouraged patient to continue saying positive affirmation statements. Intervention Used: I Am statements  Leonette Monarch, LRT/CTRS 02.16.17 10:58 am Goal: STG-Patient will identify at least five coping skills for ** STG: Coping Skills - Within 4 treatment sessions, patient will verbalize at least 5 coping skills for substance abuse in each of 2 treatment sessions to decrease substance abuse post d/c.  Outcome: Completed/Met Date Met:  02/10/16 Treatment Session 2; Completed 2 out of 2: At approximately 10:45 am, LRT met with patient in patient room. Patient verbalized 5 coping skills for substance abuse. LRT encouraged patient to participate in leisure activities. Intervention Used: Coping Skills worksheet  Leonette Monarch, LRT/CTRS 02.16.17 10:59 am

## 2016-02-10 NOTE — Plan of Care (Signed)
Problem: Alteration in mood & ability to function due to Goal: LTG-Patient demonstrates decreased signs of withdrawal (Patient demonstrates decreased signs of withdrawal to the point the patient is safe to return home and continue treatment in an outpatient setting)  Outcome: Progressing Pt denies signs of withdrawal at this time  Problem: Diagnosis: Increased Risk For Suicide Attempt Goal: STG-Patient Will Attend All Groups On The Unit Outcome: Progressing Pt attending groups on the unit

## 2016-02-10 NOTE — Progress Notes (Signed)
[facility] MD Progress Note  02/10/2016 12:26 PM Duane Hayes  MRN:  [id number] Subjective: no issues overnight per nursing.  Patient has been doing well in the unit. He has been participating in programming. He has appropriate interactions with his peers. He has been compliant with medications and unit rules.  No behavioral issues reported. During assessment he was pleasant, calm and cooperative. Continues to be concerned and anxious about his legal charges and the possible consequences of having to go to jail as he has 2 DWIs. He denies suicidality, homicidality or having auditory or visual hallucinations. Denies major issues with his mood appetite, energy or concentration. Denies side effects from medications. Denies having any physical complaints  Patient tells me he WANTS to go to rehabilitation for substance abuse at discharge. He tells me that he has 2 pending charges in different counties for DWIs.  He worries about his court dates.  Per patient's sister Gavin Pound court dates have been moved one event is in the middle of March and the other one is for later on in the spring.  She reports that the patient's LAWYERS have advised him to go to rehabilitation for substance abuse. Also the patient's family strongly supports patient going to rehabilitation.  T Per nursing: D: Pt approached writer c/o of indigestion. Patient alert and oriented x4. Patient denies SI/HI/AVH. Pt affect is anxious. Pt indicated he he has been going to groups and socializing with peers, but indicated that his back pain stopped him from going to group earlier. Pt forwards little and interacts minimally. A: Offered active listening and support. Provided therapeutic communication. Administered scheduled medications. Gave maalox for indigestion.  R: Pt pleasant and cooperative. Pt medication compliant. Will continue Q15 min. checks. Safety maintained.  Principal Problem: Substance induced mood disorder (HCC) Diagnosis:   Patient  Active Problem List   Diagnosis Date Noted  . Tobacco use disorder [F17.200] 02/07/2016  . Opioid use disorder, severe, dependence (HCC) [F11.20] 02/07/2016  . Cannabis use disorder, severe, dependence (HCC) [F12.20] 02/07/2016  . Alcohol use disorder, severe, dependence (HCC) [F10.20] 02/07/2016  . Stimulant use disorder (HCC) [F15.90] 02/07/2016  . Substance induced mood disorder [facility]) [F19.94] 02/07/2016   Total Time spent with patient: 30 minutes  Past Psychiatric History: Patient denies that he was being treated for any mental health problems. He said that he had 1 suicide attempt 30 or 40 years ago by cutting his wrist and he was in a psychiatric hospital at that time. He doesn't know any other details. Doesn't report any other suicidal behavior since then and claims to have no suicidal thoughts at all now.   Past Medical History:               Family History:Patient states that both his mother and father "didn't understand life too well". I couldn't get him to be any more specific than that. He says he's the only person in his family with a drug problem.  Social History: Patient tells me that he lives in a house with his ex-wife. He remembers her name being Rosann Auerbach, but cannot remember a phone number for her. The only phone number I could find in the chart was of his sister but when I called that number I was not able to leave a message because the mailbox was full       Past Medical History:  Past Medical History  Diagnosis Date  . Arthritis     Past Surgical History  Procedure Laterality Date  .  Shoulder surgery     Family History: History reviewed. No pertinent family history.  Social History:  History  Alcohol Use  . Yes     History  Drug Use  . Yes  . Special: Cocaine    Social History   Social History  . Marital Status: Widowed    Spouse Name: N/A  . Number of Children: N/A  . Years of Education: N/A   Social History Main Topics  . Smoking  status: Current Every Day Smoker -- 1.00 packs/day    Types: Cigarettes  . Smokeless tobacco: None  . Alcohol Use: Yes  . Drug Use: Yes    Special: Cocaine  . Sexual Activity: Not Asked   Other Topics Concern  . None   Social History Narrative    Sleep: Good  Appetite:  Good  Current Medications: Current Facility-Administered Medications  Medication Dose Route Frequency Provider Last Rate Last Dose  . acetaminophen (TYLENOL) tablet 650 mg  650 mg Oral Q6H PRN Jimmy Footman, MD   650 mg at 02/09/16 2140  . alum & mag hydroxide-simeth (MAALOX/MYLANTA) 200-200-20 MG/5ML suspension 30 mL  30 mL Oral Q4H PRN Jimmy Footman, MD   30 mL at 02/10/16 1010  . feeding supplement (ENSURE ENLIVE) (ENSURE ENLIVE) liquid 237 mL  237 mL Oral BID WC Jimmy Footman, MD   237 mL at 02/09/16 0800  . haloperidol (HALDOL) tablet 0.5 mg  0.5 mg Oral TID Jimmy Footman, MD   0.5 mg at 02/10/16 0930  . lidocaine (LIDODERM) 5 % 1 patch  1 patch Transdermal Q24H Jimmy Footman, MD   1 patch at 02/10/16 1206  . magnesium hydroxide (MILK OF MAGNESIA) suspension 30 mL  30 mL Oral Daily PRN Jimmy Footman, MD      . nicotine (NICODERM CQ - dosed in mg/24 hours) patch 21 mg  21 mg Transdermal Daily Jimmy Footman, MD   21 mg at 02/10/16 0931  . traZODone (DESYREL) tablet 150 mg  150 mg Oral QHS Jimmy Footman, MD   150 mg at 02/09/16 2141    Lab Results:  No results found for this or any previous visit (from the past 48 hour(s)).  Physical Findings: AIMS: Facial and Oral Movements Muscles of Facial Expression: None, normal Lips and Perioral Area: None, normal Jaw: None, normal Tongue: None, normal,Extremity Movements Upper (arms, wrists, hands, fingers): None, normal Lower (legs, knees, ankles, toes): None, normal, Trunk Movements Neck, shoulders, hips: None, normal, Overall Severity Severity of abnormal movements  (highest score from questions above): None, normal Incapacitation due to abnormal movements: None, normal Patient's awareness of abnormal movements (rate only patient's report): No Awareness, Dental Status Current problems with teeth and/or dentures?: No Does patient usually wear dentures?: No  CIWA:  CIWA-Ar Total: 3 COWS:  COWS Total Score: 1  Musculoskeletal: Strength & Muscle Tone: within normal limits Gait & Station: normal Patient leans: N/A  Psychiatric Specialty Exam: Review of Systems  Constitutional: Negative.   HENT: Negative.   Eyes: Negative.   Respiratory: Negative.   Gastrointestinal: Negative.   Genitourinary: Negative.   Musculoskeletal: Positive for back pain.  Skin: Negative.   Neurological: Negative.   Endo/Heme/Allergies: Negative.   Psychiatric/Behavioral: Positive for substance abuse. Negative for depression, suicidal ideas, hallucinations and memory loss. The patient is not nervous/anxious and does not have insomnia.     Blood pressure 121/64, pulse 83, temperature 98.6 F (37 C), temperature source Oral, resp. rate 20, height 5\' 9"  (1.753 m), weight 60.782 kg (  134 lb), SpO2 99 %.Body mass index is 19.78 kg/(m^2).  General Appearance: Fairly Groomed  Patent attorney::  Good  Speech:  Clear and Coherent  Volume:  Normal  Mood:  Anxious  Affect:  Appropriate  Thought Process:  Logical  Orientation:  Full (Time, Place, and Person)  Thought Content:  Hallucinations: None  Suicidal Thoughts:  No  Homicidal Thoughts:  No  Memory:  Immediate;   Good Recent;   Good Remote;   Good  Judgement:  Fair  Insight:  Fair  Psychomotor Activity:  Decreased  Concentration:  Good  Recall:  Good  Fund of Knowledge:Good  Language: Good  Akathisia:  No  Handed:    AIMS (if indicated):     Assets:  Manufacturing systems engineer Physical Health  ADL's:  Intact  Cognition: WNL  Sleep:  Number of Hours: 7.25   Treatment Plan Summary: Daily contact with patient to assess  and evaluate symptoms and progress in treatment and Medication management   55 year old Caucasian male with known history of opiate, alcohol, cocaine and cannabis dependent 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 Per nurse's information to other people were found unresponsive at the same location.  Per review of records looks like the patient does not have a prior history of mental illness. He does have a documented history of alcohol and opiate addiction.  Patient says he has been in rehabilitation a multitude of times. Patient also reports criminal behavior. Says he has been in prison a multitude of times and has completed rehabilitation while in prison.  Substance-induced mood disorder: We will hold off from starting antidepressants.  Anxiety: for now continue haldol 0.5 mg q 8 h.  Insomnia: Continue trazodone 150 mg by mouth daily at bedtime  Tobacco use disorder: Patient will receive nicotine patch 21 mg  Alcohol, opiate withdrawal:had delirium tremens while on the medical floor. At this point no evidence of withdrawal  Alcohol-cocaine-heroin-cannabis dependence: We have referred the patient to [facility] referral has been submitted. He is in agreement with going. Family is supportive of him going.  Chronic back pain: doing well with lidoderm patch.  Precautions continue q 25m checks  Diet regular  Referral made to [facility] 2 days ago  Jimmy Footman, MD 02/10/2016, 12:26 PM

## 2016-02-10 NOTE — Plan of Care (Signed)
Problem: Alteration in mood & ability to function due to Goal: STG: Patient verbalizes decreases in signs of withdrawal Outcome: Progressing Pt currently denying withdrawal symptoms

## 2016-02-10 NOTE — Progress Notes (Signed)
Recreation Therapy Notes  Date: 02.16.17 Time: 2:00 pm Location: Community Room  Group Topic: Coping Skills/Leisure Education  Goal Area(s) Addresses:  Patient will identify things they are grateful for. Patient will identify how being grateful can influence decision making.  Behavioral Response: Attentive, Interactive  Intervention: Grateful Wheel  Activity: Patients were given an "I Am Grateful For" worksheet and instructed to write 2-3 things they were grateful for under each category.  Education: LRT educated patients on why it is important to think of things they are grateful for.  Education Outcome: Acknowledges education/In group clarification offered  Clinical Observations/Feedback: Patient completed activity by writing at least one item in each category. Patient contributed to group discussion by stating things he was grateful for, what his common thread was, and how being aware of what he is grateful for affects his decision making skills.  Jacquelynn Cree, LRT/CTRS 02/10/2016 2:57 PM

## 2016-02-10 NOTE — BHH Suicide Risk Assessment (Signed)
[facility] Discharge Suicide Risk Assessment   Principal Problem: Substance induced mood disorder [facility]) Discharge Diagnoses:  Patient Active Problem List   Diagnosis Date Noted  . Tobacco use disorder [F17.200] 02/07/2016  . Opioid use disorder, severe, dependence (HCC) [F11.20] 02/07/2016  . Cannabis use disorder, severe, dependence (HCC) [F12.20] 02/07/2016  . Alcohol use disorder, severe, dependence (HCC) [F10.20] 02/07/2016  . Stimulant use disorder (HCC) [F15.90] 02/07/2016  . Substance induced mood disorder [facility]) [F19.94] 02/07/2016     Psychiatric Specialty Exam: ROS                                                         Mental Status Per Nursing Assessment::   On Admission:     Demographic Factors:  Male, Caucasian and Unemployed  Loss Factors: Legal issues  Historical Factors: Impulsivity  Risk Reduction Factors:   Positive social support  Continued Clinical Symptoms:  Alcohol/Substance Abuse/Dependencies Chronic Pain  Cognitive Features That Contribute To Risk:  None    Suicide Risk:  Minimal: No identifiable suicidal ideation.  Patients presenting with no risk factors but with morbid ruminations; may be classified as minimal risk based on the severity of the depressive symptoms  Follow-up Information    Follow up with [facility] On 02/12/2016.   Why:  You will be transported to [facility] today by police.    Contact information:   [address] [place], [place] [address] Phone: [phone] Fax: [phone]       Jimmy Footman, MD 02/12/2016, 6:14 AM

## 2016-02-10 NOTE — Plan of Care (Signed)
Problem: Alteration in mood Goal: LTG-Patient reports reduction in suicidal thoughts (Patient reports reduction in suicidal thoughts and is able to verbalize a safety plan for whenever patient is feeling suicidal)  Outcome: Progressing Denies suicidal ideations     

## 2016-02-10 NOTE — Progress Notes (Signed)
D: Pt approached Clinical research associate c/o of indigestion. Patient alert and oriented x4. Patient denies SI/HI/AVH. Pt affect is anxious. Pt indicated he he has been going to groups and socializing with peers, but indicated that his back pain stopped him from going to group earlier. Pt forwards little and interacts minimally. A: Offered active listening and support. Provided therapeutic communication. Administered scheduled medications. Gave maalox for indigestion.  R: Pt pleasant and cooperative. Pt medication compliant. Will continue Q15 min. checks. Safety maintained.

## 2016-02-10 NOTE — Discharge Summary (Addendum)
Physician Discharge Summary Note  Patient:  Duane Hayes is an 55 y.o., male MRN:  [id number] DOB:  [birth year] Patient phone:  [phone] (home)  Patient address:   [address] [address],  Total Time spent with patient: 30 minutes  Date of Admission:  02/07/2016 Date of Discharge: 02/12/16  Reason for Admission:  Serious OD  Principal Problem: Substance induced mood disorder [facility]) Discharge Diagnoses: Patient Active Problem List   Diagnosis Date Noted  . Tobacco use disorder [F17.200] 02/07/2016  . Opioid use disorder, severe, dependence ([place]) [F11.20] 02/07/2016  . Cannabis use disorder, severe, dependence ([place]) [F12.20] 02/07/2016  . Alcohol use disorder, severe, dependence ([place]) [F10.20] 02/07/2016  . Stimulant use disorder ([place]) [F15.90] 02/07/2016  . Substance induced mood disorder [facility]) [F19.94] 02/07/2016    History of Present Illness:  The patient presented the emergency department via EMS after being found unconscious in a motel room on 01/21/16. The patient was found with ice all over his body. Initially the patient's pulse was about 20 bpm and respirations were shallow. The patient was given Narcan and became very agitated. During an attempt to subdue the patient in order to supply supplemental oxygen he lost a pulse. CPR was started and achieved return of circulation. The patient's urine toxicology screen was positive for cannabis, amphetamines, and tricyclics. Alcohol was also found in the patient's blood (131). Head CT was neg  Patient require mechanical ventilation from January 27 to February 7. During his stay in ICU patient developed delirium tremens.  Patient does not recall coming into the hospital but does recall that he was having a big party that night and that he was doing heroin. He doesn't recall what other drugs he was doing. He is not particularly surprised at the fact that he was found covered with ice. He assumes that the people he was  partying with notice that he was getting hyperthermic and so they report ice on him and called 911. Patient denies that he's been having any symptoms of depression. Currently however he describes his mood is irritable and frustrated. He denies being aware of any psychiatric problems prior to hospitalization. He completely denies that there was any suicidal intent or plan involved in what brought him into the hospital and denies any suicidal or homicidal ideation now.  Patient states he does not want to be in the psychiatric unit. He would like to be discharged back home. He has no desire to attend rehabilitation.  He has been getting multiple doses of benzodiazepines and antipsychotics to control his agitation. He was on one to one with a sitter while on the medical floor. Today during the interview patient had episodes of confusion and appears to have unsteady gait. I do agree with the patient appears to still have delirium.  Substance abuse history: Per records [facility] patient has a long history of alcohol, cocaine heroine and cannabis use. Patient also smoke one pack of cigarettes per day  Per records on [facility] June 2016: "Detrick Dani is a 55 y.o. male who presents via EMS for a fall. He states he was with friends drinking liquor and the next thing he knew he was on the ground. EMS reports that heroin was found in the car and that when his girlfriend opened his car door he fell onto his face. He was ambulatory on scene, was placed in a C-Collar, and became increasingly somnolent en route and was given 0.52m Narcan IV in triage for  RR 8 and SPO2 79%. He admits to headache "like my brain is on fire," but denies any focal tenderness, blurry vision, double vision, or any other pain or complaints. He denies any medical history other than a shoulder surgery >10 years ago. He admits to alcohol use today and states someone may have shared a drug with him earlier."  No prior records in  our system other than some visits to the emergency room.  Associated Signs/Symptoms: Depression Symptoms: depressed mood, (Hypo) Manic Symptoms: denies Anxiety Symptoms: denies Psychotic Symptoms: denies PTSD Symptoms: NA   Past Psychiatric History: Patient denies that he was being treated for any mental health problems. He said that he had 1 suicide attempt 30 or 40 years ago by cutting his wrist and he was in a psychiatric hospital at that time. He doesn't know any other details. Doesn't report any other suicidal behavior since then and claims to have no suicidal thoughts at all now.      Family History:Patient states that both his mother and father "didn't understand life too well". I couldn't get him to be any more specific than that. He says he's the only person in his family with a drug problem.  Social History: Patient tells me that he lives in a house with his ex-wife. He remembers her name being [name], but cannot remember a phone number for her. The only phone number I could find in the chart was of his sister but when I called that number I was not able to leave a message because the mailbox was full       Past Medical History:  Past Medical History  Diagnosis Date  . Arthritis     Past Surgical History  Procedure Laterality Date  . Shoulder surgery      Social History:  History  Alcohol Use  . Yes     History  Drug Use  . Yes  . Special: Cocaine    Social History   Social History  . Marital Status: Widowed    Spouse Name: N/A  . Number of Children: N/A  . Years of Education: N/A   Social History Main Topics  . Smoking status: Current Every Day Smoker -- 1.00 packs/day    Types: Cigarettes  . Smokeless tobacco: None  . Alcohol Use: Yes  . Drug Use: Yes    Special: Cocaine  . Sexual Activity: Not Asked   Other Topics Concern  . None   Social History Narrative    Hospital Course:    55 year old Caucasian male with known history  of opiate, alcohol, cocaine and cannabis dependent 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 Per nurse's information to other people were found unresponsive at the same location.  One of them passed away.  Per review of records looks like the patient does not have a prior history of mental illness. He does have a documented history of alcohol and opiate addiction.  Patient says that his girlfriend of 2 years passed away in 11/28/15. Patient says he has had great difficulty coping with the situation.  Patient says he has been in rehabilitation a multitude of times. Patient also reports criminal behavior. Says he has been in prison a multitude of times and has completed rehabilitation while in prison.  Upon arrival to our unit the patient was is still delirious. He was restarted on haloperidol 0.5 mg by mouth 3 times a day and he was placed on 1:1 precautions to  prevent falls as his gait was unsteady. The delirium cleared the next morning.  The 1:1 was d/c as  he was not longer unsteady on his feet and no longer appeared confused.  Patient was admitted to our unit with a diagnosis of substance-induced mood disorder as it was suspected that the overdose was a suicidal attempt. Now that the patient is cognitively intact he is denying this was a suicidal attempt.   During his sister there was no need for seclusion, restraints or forced medications. The patient did not display any disruptive behaviors. Had appropriate interactions with peers and staff. On discharge the patient reported anxiety related to his legal charges but other than that he denied a depressed mood, or major problems with sleep, appetite, energy or concentration. He denied suicidality, homicidality or having auditory or visual hallucinations.  Substance-induced mood disorder: We will hold off from starting antidepressants.  Psychology was consulted for cognitive testing: pt scored WNL. No major  cognitive deficits identified  Anxiety: for now continue haldol 0.5 mg q 8 h.  Insomnia: Continue trazodone 150 mg by mouth daily at bedtime  Tobacco use disorder: Patient will receive nicotine patch 21 mg  Alcohol, opiate withdrawal:had delirium tremens while on the medical floor. At this point no evidence of withdrawal  Alcohol-cocaine-heroin-cannabis dependence: We have referred the patient to [facility] referral has been submitted. He is in agreement with going. Family is supportive of him going.  Chronic back pain: doing well with lidoderm patch. Will add flexeril prn   Diet regular  Referral made to [facility]----will d/c there today    Physical Findings: AIMS: Facial and Oral Movements Muscles of Facial Expression: None, normal Lips and Perioral Area: None, normal Jaw: None, normal Tongue: None, normal,Extremity Movements Upper (arms, wrists, hands, fingers): None, normal Lower (legs, knees, ankles, toes): None, normal, Trunk Movements Neck, shoulders, hips: None, normal, Overall Severity Severity of abnormal movements (highest score from questions above): None, normal Incapacitation due to abnormal movements: None, normal Patient's awareness of abnormal movements (rate only patient's report): No Awareness, Dental Status Current problems with teeth and/or dentures?: No Does patient usually wear dentures?: No  CIWA:  CIWA-Ar Total: 3 COWS:  COWS Total Score: 1  Musculoskeletal: Strength & Muscle Tone: within normal limits Gait & Station: normal Patient leans: N/A  Psychiatric Specialty Exam: Review of Systems  Constitutional: Negative.   HENT: Negative.   Eyes: Negative.   Respiratory: Negative.   Cardiovascular: Negative.   Gastrointestinal: Negative.   Genitourinary: Negative.   Musculoskeletal: Negative.   Skin: Negative.   Neurological: Negative.   Endo/Heme/Allergies: Negative.   Psychiatric/Behavioral: Negative.     Blood pressure 117/74, pulse 93,  temperature 98.4 F (36.9 C), temperature source Oral, resp. rate 20, height 5' 9"  (1.753 m), weight 60.782 kg (134 lb), SpO2 99 %.Body mass index is 19.78 kg/(m^2).  General Appearance: Well Groomed  Engineer, water::  Good  Speech:  Clear and Coherent  Volume:  Normal  Mood:  Anxious  Affect:  Congruent  Thought Process:  Logical  Orientation:  Full (Time, Place, and Person)  Thought Content:  Hallucinations: None  Suicidal Thoughts:  No  Homicidal Thoughts:  No  Memory:  Immediate;   Good Recent;   Good Remote;   Good  Judgement:  Poor  Insight:  Shallow  Psychomotor Activity:  Normal  Concentration:  Fair  Recall:  [place] of Knowledge:Good  Language: Good  Akathisia:  No  Handed:    AIMS (if indicated):  Assets:  Armed forces logistics/support/administrative officer Social Support  ADL's:  Intact  Cognition: WNL  Sleep:  Number of Hours: 8   Have you used any form of tobacco in the last 30 days? (Cigarettes, Smokeless Tobacco, Cigars, and/or Pipes): Yes  Has this patient used any form of tobacco in the last 30 days? (Cigarettes, Smokeless Tobacco, Cigars, and/or Pipes) Yes, Yes, A prescription for an FDA-approved tobacco cessation medication was offered at discharge and the patient refused  Blood Alcohol level:  Lab Results  Component Value Date   [facility] 131* 01/21/2016   ETH 153* 62/56/3893    Metabolic Disorder Labs:  Lab Results  Component Value Date   HGBA1C 5.6 01/21/2016   No results found for: PROLACTIN Lab Results  Component Value Date   TRIG 60 01/26/2016   Results for TOIVO, BORDON (MRN [id number]) as of 02/10/2016 16:02  Ref. Range 02/02/2016 05:09  Sodium Latest Ref Range: 135-145 mmol/L 137  Potassium Latest Ref Range: 3.5-5.1 mmol/L 4.8  Chloride Latest Ref Range: 101-111 mmol/L 109  CO2 Latest Ref Range: 22-32 mmol/L 21 (L)  BUN Latest Ref Range: 6-20 mg/dL 7  Creatinine Latest Ref Range: 0.61-1.24 mg/dL 0.67  Calcium Latest Ref Range: 8.9-10.3 mg/dL 8.1 (L)  EGFR  (Non-African Amer.) Latest Ref Range: >60 mL/min >60  EGFR (African American) Latest Ref Range: >60 mL/min >60  Glucose Latest Ref Range: 65-99 mg/dL 319 (H)  Anion gap Latest Ref Range: 5-15  7  Phosphorus Latest Ref Range: 2.5-4.6 mg/dL 4.1  Magnesium Latest Ref Range: 1.7-2.4 mg/dL 1.8   Results for CLINTEN, HOWK (MRN [id number]) as of 02/10/2016 16:02  Ref. Range 02/01/2016 04:32  WBC Latest Ref Range: 3.8-10.6 K/uL 11.5 (H)  RBC Latest Ref Range: 4.40-5.90 MIL/uL 3.71 (L)  Hemoglobin Latest Ref Range: 13.0-18.0 g/dL 11.5 (L)  HCT Latest Ref Range: 40.0-52.0 % 33.9 (L)  MCV Latest Ref Range: 80.0-100.0 fL 91.2  MCH Latest Ref Range: 26.0-34.0 pg 30.9  MCHC Latest Ref Range: 32.0-36.0 g/dL 33.9  RDW Latest Ref Range: 11.5-14.5 % 14.3  Platelets Latest Ref Range: 150-440 K/uL 426   Results for JAKALEB, PAYER (MRN [id number]) as of 02/10/2016 16:02  Ref. Range 01/22/2016 04:51  Phosphorus Latest Ref Range: 2.5-4.6 mg/dL 2.7  Magnesium Latest Ref Range: 1.7-2.4 mg/dL 1.8  Alkaline Phosphatase Latest Ref Range: 38-126 U/L 78  Albumin Latest Ref Range: 3.5-5.0 g/dL 2.8 (L)  AST Latest Ref Range: 15-41 U/L 24  ALT Latest Ref Range: 17-63 U/L 26  Total Protein Latest Ref Range: 6.5-8.1 g/dL 5.2 (L)  Total Bilirubin Latest Ref Range: 0.3-1.2 mg/dL 0.8    Results for LUISMIGUEL, LAMERE (MRN [id number]) as of 02/10/2016 16:02  Ref. Range 01/21/2016 02:57  Alcohol, Ethyl (B) Latest Ref Range: <5 mg/dL 131 (H)  Amphetamines, Ur Screen Latest Ref Range: NONE DETECTED  POSITIVE (A)  Barbiturates, Ur Screen Latest Ref Range: NONE DETECTED  NONE DETECTED  Benzodiazepine, Ur Scrn Latest Ref Range: NONE DETECTED  NONE DETECTED  Cocaine Metabolite,Ur [place] Latest Ref Range: NONE DETECTED  POSITIVE (A)  Methadone Scn, Ur Latest Ref Range: NONE DETECTED  NONE DETECTED  MDMA (Ecstasy)Ur Screen Latest Ref Range: NONE DETECTED  NONE DETECTED  Cannabinoid 50 Ng, Ur [place] Latest Ref Range: NONE DETECTED   POSITIVE (A)  Opiate, Ur Screen Latest Ref Range: NONE DETECTED  NONE DETECTED  Phencyclidine (PCP) Ur S Latest Ref Range: NONE DETECTED  NONE DETECTED  Tricyclic, Ur Screen Latest Ref Range: NONE DETECTED  POSITIVE (A)   See Psychiatric Specialty Exam and Suicide Risk Assessment completed by Attending Physician prior to discharge.  Discharge destination:  [facility]  Is patient on multiple antipsychotic therapies at discharge:  No   Has Patient had three or more failed trials of antipsychotic monotherapy by history:  No  Recommended Plan for Multiple Antipsychotic Therapies: NA     Medication List    TAKE these medications      Indication   cyclobenzaprine 10 MG tablet  Commonly known as:  FLEXERIL  Take 1 tablet (10 mg total) by mouth 2 (two) times daily as needed for muscle spasms.  Notes to Patient:  pain      lidocaine 5 %  Commonly known as:  LIDODERM  Place 1 patch onto the skin daily. Remove & Discard patch within 12 hours or as directed by MD  Notes to Patient:  Back pain      traZODone 150 MG tablet  Commonly known as:  DESYREL  Take 1 tablet (150 mg total) by mouth at bedtime.  Notes to Patient:  insomnia        Follow-up Information    Follow up with [facility] On 02/12/2016.   Why:  You will be transported to [facility] today by police.    Contact information:   [place], [place] [address] Phone: [phone] Fax: [phone]     >30 minutes. >50 % of the time was spent in coordination of care   Signed: Hildred Priest, MD 02/12/2016, 6:10 AM

## 2016-02-10 NOTE — Tx Team (Signed)
Interdisciplinary Treatment Plan Update (Adult)  Date:  02/10/2016 Time Reviewed:  4:45 PM  Progress in Treatment: Attending groups: Yes. Participating in groups:  Yes. Taking medication as prescribed:  Yes. Tolerating medication:  Yes. Family/Significant othe contact made:  No, will contact:  pt's sister Patient understands diagnosis:  Yes. Discussing patient identified problems/goals with staff:  Yes. Medical problems stabilized or resolved:  Yes. Denies suicidal/homicidal ideation: Yes. Issues/concerns per patient self-inventory:  Yes. Other:  New problem(s) identified: No, Describe:  NA  Discharge Plan or Barriers: Pt is going to [facility] on Saturday.   Reason for Continuation of Hospitalization: Depression Medication stabilization Suicidal ideation Withdrawal symptoms  Comments: Patient has been doing well in the unit. He has been participating in programming. He has appropriate interactions with his peers. He has been compliant with medications and unit rules. No behavioral issues reported. During assessment he was pleasant, calm and cooperative. Continues to be concerned and anxious about his legal charges and the possible consequences of having to go to jail as he has 2 DWIs. He denies suicidality, homicidality or having auditory or visual hallucinations. Denies major issues with his mood appetite, energy or concentration. Denies side effects from medications. Denies having any physical complaints Patient tells me he wants to go to rehabilitation for substance abuse at discharge. He tells me that he has 2 pending charges in different counties for DWIs. He worries about his court dates. Per patient's sister Duane Hayes court dates have been moved one event is in the middle of March and the other one is for later on in the spring. She reports that the patient's LAWYERS have advised him to go to rehabilitation for substance abuse. Also the patient's family strongly supports patient going  to rehabilitation.  Estimated length of stay: 2 days   New goal(s): NA  Review of initial/current patient goals per problem list:   1.  Goal(s): Patient will participate in aftercare plan * Met:  * Target date: at discharge * As evidenced by: Patient will participate within aftercare plan AEB aftercare provider and housing plan at discharge being identified.   2.  Goal (s): Patient will exhibit decreased depressive symptoms and suicidal ideations. * Met:  *  Target date: at discharge * As evidenced by: Patient will utilize self rating of depression at 3 or below and demonstrate decreased signs of depression or be deemed stable for discharge by MD.   3.  Goal(s): Patient will demonstrate decreased signs and symptoms of anxiety. * Met:  * Target date: at discharge * As evidenced by: Patient will utilize self rating of anxiety at 3 or below and demonstrated decreased signs of anxiety, or be deemed stable for discharge by MD   4.  Goal(s): Patient will demonstrate decreased signs of withdrawal due to substance abuse * Met:  * Target date: at discharge * As evidenced by: Patient will produce a CIWA/COWS score of 0, have stable vitals signs, and no symptoms of withdrawal.   Attendees: Patient:  Duane Hayes 2/16/20174:45 PM  Family:   2/16/20174:45 PM  Physician:  Dr. Jerilee Hoh   2/16/20174:45 PM  Nursing:   Meredith Mody, RN 2/16/20174:45 PM  Case Manager:   2/16/20174:45 PM  Counselor:   2/16/20174:45 PM  Other:  Wray Kearns, [place] 2/16/20174:45 PM  Other:  Everitt Amber, [place] 2/16/20174:45 PM  Other:   2/16/20174:45 PM  Other:  2/16/20174:45 PM  Other:  2/16/20174:45 PM  Other:  2/16/20174:45 PM  Other:  2/16/20174:45 PM  Other:  2/16/20174:45 PM  Other:  2/16/20174:45 PM  Other:   2/16/20174:45 PM   Scribe for Treatment Team:   Wray Kearns, MSW, LCSWA  02/10/2016, 4:45 PM

## 2016-02-10 NOTE — [facility] Group Notes (Signed)
[facility] Group Notes:  (Nursing/MHT/Case Management/Adjunct)  Date:  02/10/2016  Time:  2:37 PM  Type of Therapy:  Group Therapy  Participation Level:  Active  Participation Quality:  Appropriate and Attentive  Affect:  Appropriate  Cognitive:  Alert, Appropriate and Oriented  Insight:  Appropriate  Engagement in Group:  Engaged  Modes of Intervention:  Activity  Summary of Progress/Problems:  Duane Hayes 02/10/2016, 2:37 PM 

## 2016-02-10 NOTE — Progress Notes (Signed)
D: Patient voice of goal " To go home" Patient stated slept poor last night .Stated appetite is good and energy level  Is normal. Stated concentration is good . Stated on Depression scale 5 , hopeless 4 and anxiety 5 .( low 0-10 high) Denies suicidal  homicidal ideations  .  No auditory hallucinations  No pain concerns . Appropriate ADL'S. Interacting with peers and staff. Attending  Unit programing. A: Encourage patient participation with unit programming . Instruction  Given on  Medication , verbalize understanding. R: Voice no other concerns. Staff continue to monitor

## 2016-02-11 MED ORDER — CYCLOBENZAPRINE HCL 10 MG PO TABS: 10.0000 mg | ORAL_TABLET | Freq: Two times a day (BID) | ORAL | Status: AC | PRN

## 2016-02-11 MED ORDER — CYCLOBENZAPRINE HCL 10 MG PO TABS
10.0000 mg | ORAL_TABLET | Freq: Two times a day (BID) | ORAL | Status: DC | PRN
  Administered 2016-02-12: 10 mg via ORAL
  Filled 2016-02-11: qty 1

## 2016-02-11 MED ORDER — CYCLOBENZAPRINE HCL 10 MG PO TABS
10.0000 mg | ORAL_TABLET | Freq: Once | ORAL | Status: AC
  Administered 2016-02-11: 10 mg via ORAL
  Filled 2016-02-11: qty 1

## 2016-02-11 NOTE — [facility] Suicide Risk Assessment (Signed)
[facility] INPATIENT:  Family/Significant Other Suicide Prevention Education  Suicide Prevention Education:  Education Completed;Debbie Kettles [phone] (Sister)  has been identified by the patient as the family member/significant other with whom the patient will be residing, and identified as the person(s) who will aid the patient in the event of a mental health crisis (suicidal ideations/suicide attempt).  With written consent from the patient, the family member/significant other has been provided the following suicide prevention education, prior to the and/or following the discharge of the patient.  The suicide prevention education provided includes the following:  Suicide risk factors  Suicide prevention and interventions  National Suicide Hotline telephone number  [facility] assessment telephone number  [facility] Emergency Assistance 911  [facility] and/or Residential Mobile Crisis Unit telephone number  Request made of family/significant other to:  Remove weapons (e.g., guns, rifles, knives), all items previously/currently identified as safety concern.    Remove drugs/medications (over-the-counter, prescriptions, illicit drugs), all items previously/currently identified as a safety concern.  The family member/significant other verbalizes understanding of the suicide prevention education information provided.  The family member/significant other agrees to remove the items of safety concern listed above.   Daisy Floro Amyre Segundo MSW, LCSWA  02/11/2016, 4:36 PM

## 2016-02-11 NOTE — Progress Notes (Addendum)
[facility] MD Progress Note  02/11/2016 10:22 AM Duane Hayes  MRN:  [id number] Subjective: no issues overnight per nursing.  Patient has been doing well in the unit. He has been participating in programming. He has appropriate interactions with his peers. He has been compliant with medications and unit rules.  No behavioral issues reported. During assessment he was pleasant, calm and cooperative. Continues to be concerned and anxious about his legal charges and the possible consequences of having to go to jail as he has 2 DWIs. He denies suicidality, homicidality or having auditory or visual hallucinations. Denies major issues with his mood appetite, energy or concentration. Denies side effects from medications. Far as physical complaints the patient reports significant back pain today. I offered him Flexeril patient agrees.  Pt has been accepted to [facility] ----will d/c there tomorrow  Patient tells me he WANTS to go to rehabilitation for substance abuse at discharge. He tells me that he has 2 pending charges in different counties for DWIs.  He worries about his court dates.  Per patient's sister [place] P[place] court dates have been moved one event is in the middle of March and the other one is for later on in the spring.  She reports that the patient's LAWYERS have advised him to go to rehabilitation for substance abuse. Also the patient's family strongly supports patient going to rehabilitation.    Principal Problem: Substance induced mood disorder (HCC) Diagnosis:   Patient Active Problem List   Diagnosis Date Noted  . Tobacco use disorder [F17.200] 02/07/2016  . Opioid use disorder, severe, dependence (HCC) [F11.20] 02/07/2016  . Cannabis use disorder, severe, dependence (HCC) [F12.20] 02/07/2016  . Alcohol use disorder, severe, dependence (HCC) [F10.20] 02/07/2016  . Stimulant use disorder (HCC) [F15.90] 02/07/2016  . Substance induced mood disorder [facility]) [F19.94] 02/07/2016   Total Time spent with  patient: 30 minutes  Past Psychiatric History: Patient denies that he was being treated for any mental health problems. He said that he had 1 suicide attempt 30 or 40 years ago by cutting his wrist and he was in a psychiatric hospital at that time. He doesn't know any other details. Doesn't report any other suicidal behavior since then and claims to have no suicidal thoughts at all now.   Past Medical History:               Family History:Patient states that both his mother and father "didn't understand life too well". I couldn't get him to be any more specific than that. He says he's the only person in his family with a drug problem.  Social History: Patient tells me that he lives in a house with his ex-wife. He remembers her name being Rosann Auerbach, but cannot remember a phone number for her. The only phone number I could find in the chart was of his sister but when I called that number I was not able to leave a message because the mailbox was full       Past Medical History:  Past Medical History  Diagnosis Date  . Arthritis     Past Surgical History  Procedure Laterality Date  . Shoulder surgery     Family History: History reviewed. No pertinent family history.  Social History:  History  Alcohol Use  . Yes     History  Drug Use  . Yes  . Special: Cocaine    Social History   Social History  . Marital Status: Widowed    Spouse Name: N/A  .  Number of Children: N/A  . Years of Education: N/A   Social History Main Topics  . Smoking status: Current Every Day Smoker -- 1.00 packs/day    Types: Cigarettes  . Smokeless tobacco: None  . Alcohol Use: Yes  . Drug Use: Yes    Special: Cocaine  . Sexual Activity: Not Asked   Other Topics Concern  . None   Social History Narrative    Sleep: Good  Appetite:  Good  Current Medications: Current Facility-Administered Medications  Medication Dose Route Frequency Provider Last Rate Last Dose  . acetaminophen  (TYLENOL) tablet 650 mg  650 mg Oral Q6H PRN Jimmy Footman, MD   650 mg at 02/11/16 0845  . alum & mag hydroxide-simeth (MAALOX/MYLANTA) 200-200-20 MG/5ML suspension 30 mL  30 mL Oral Q4H PRN Jimmy Footman, MD   30 mL at 02/10/16 1010  . cyclobenzaprine (FLEXERIL) tablet 10 mg  10 mg Oral Once Jimmy Footman, MD      . cyclobenzaprine (FLEXERIL) tablet 10 mg  10 mg Oral BID PRN Jimmy Footman, MD      . feeding supplement (ENSURE ENLIVE) (ENSURE ENLIVE) liquid 237 mL  237 mL Oral BID WC Jimmy Footman, MD   237 mL at 02/09/16 0800  . haloperidol (HALDOL) tablet 0.5 mg  0.5 mg Oral TID Jimmy Footman, MD   0.5 mg at 02/11/16 0844  . lidocaine (LIDODERM) 5 % 1 patch  1 patch Transdermal Q24H Jimmy Footman, MD   1 patch at 02/10/16 1206  . magnesium hydroxide (MILK OF MAGNESIA) suspension 30 mL  30 mL Oral Daily PRN Jimmy Footman, MD      . nicotine (NICODERM CQ - dosed in mg/24 hours) patch 21 mg  21 mg Transdermal Daily Jimmy Footman, MD   21 mg at 02/10/16 0931  . traZODone (DESYREL) tablet 150 mg  150 mg Oral QHS Jimmy Footman, MD   150 mg at 02/10/16 2101    Lab Results:  No results found for this or any previous visit (from the past 48 hour(s)).  Physical Findings: AIMS: Facial and Oral Movements Muscles of Facial Expression: None, normal Lips and Perioral Area: None, normal Jaw: None, normal Tongue: None, normal,Extremity Movements Upper (arms, wrists, hands, fingers): None, normal Lower (legs, knees, ankles, toes): None, normal, Trunk Movements Neck, shoulders, hips: None, normal, Overall Severity Severity of abnormal movements (highest score from questions above): None, normal Incapacitation due to abnormal movements: None, normal Patient's awareness of abnormal movements (rate only patient's report): No Awareness, Dental Status Current problems with teeth and/or  dentures?: No Does patient usually wear dentures?: No  CIWA:  CIWA-Ar Total: 3 COWS:  COWS Total Score: 1  Musculoskeletal: Strength & Muscle Tone: within normal limits Gait & Station: normal Patient leans: N/A  Psychiatric Specialty Exam: Review of Systems  Constitutional: Negative.   HENT: Negative.   Eyes: Negative.   Respiratory: Negative.   Gastrointestinal: Negative.   Genitourinary: Negative.   Musculoskeletal: Positive for back pain.  Skin: Negative.   Neurological: Negative.   Endo/Heme/Allergies: Negative.   Psychiatric/Behavioral: Positive for substance abuse. Negative for depression, suicidal ideas, hallucinations and memory loss. The patient is not nervous/anxious and does not have insomnia.     Blood pressure 117/74, pulse 93, temperature 98.4 F (36.9 C), temperature source Oral, resp. rate 20, height  (1.753 m), weight 60.782 kg (134 lb), SpO2 99 %.Body mass index is 19.78 kg/(m^2).  General Appearance: Fairly Groomed  Patent attorney::  Good  Speech:  Clear and Coherent  Volume:  Normal  Mood:  Anxious  Affect:  Appropriate  Thought Process:  Logical  Orientation:  Full (Time, Place, and Person)  Thought Content:  Hallucinations: None  Suicidal Thoughts:  No  Homicidal Thoughts:  No  Memory:  Immediate;   Good Recent;   Good Remote;   Good  Judgement:  Fair  Insight:  Fair  Psychomotor Activity:  Decreased  Concentration:  Good  Recall:  Good  Fund of Knowledge:Good  Language: Good  Akathisia:  No  Handed:    AIMS (if indicated):     Assets:  Manufacturing systems engineer Physical Health  ADL's:  Intact  Cognition: WNL  Sleep:  Number of Hours: 8   Treatment Plan Summary: Daily contact with patient to assess and evaluate symptoms and progress in treatment and Medication management   55 year old Caucasian male with known history of opiate, alcohol, cocaine and cannabis dependent 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  Per nurse's information to other people were found unresponsive at the same location.  Per review of records looks like the patient does not have a prior history of mental illness. He does have a documented history of alcohol and opiate addiction.  Patient says he has been in rehabilitation a multitude of times. Patient also reports criminal behavior. Says he has been in prison a multitude of times and has completed rehabilitation while in prison.  Substance-induced mood disorder: We will hold off from starting antidepressants.  Psychology was consulted for cognitive testing: pt scored WNL.  No major cognitive deficits identified  Anxiety: for now continue haldol 0.5 mg q 8 h.  Insomnia: Continue trazodone 150 mg by mouth daily at bedtime  Tobacco use disorder: Patient will receive nicotine patch 21 mg  Alcohol, opiate withdrawal:had delirium tremens while on the medical floor. At this point no evidence of withdrawal  Alcohol-cocaine-heroin-cannabis dependence: We have referred the patient to [facility] referral has been submitted. He is in agreement with going. Family is supportive of him going.  Chronic back pain: doing well with lidoderm patch.  Will add flexeril prn today  Precautions continue q 30m checks  Diet regular  Referral made to [facility]----will d/c there tomorrow  Jimmy Footman, MD 02/11/2016, 10:22 AM

## 2016-02-11 NOTE — Plan of Care (Signed)
Problem: Alteration in mood Goal: LTG-Pt's behavior demonstrates decreased signs of depression (Patient's behavior demonstrates decreased signs of depression to the point the patient is safe to return home and continue treatment in an outpatient setting)  Outcome: Progressing Patient reports feeling better today than he did yesterday

## 2016-02-11 NOTE — [facility] Group Notes (Signed)
[facility] LCSW Group Therapy  02/11/2016 8:52 AM  Type of Therapy:  Group Therapy  Participation Level:  Active  Participation Quality:  Appropriate and Attentive  Affect:  Appropriate  Cognitive:  Alert, Appropriate and Oriented  Insight:  Engaged  Engagement in Therapy:  Engaged  Modes of Intervention:  Discussion, Socialization and Support  Summary of Progress/Problems: Patient attended and participated in group introducing himself and participating in introductory exercise sharing his 'super power' is "change the world and make peace in the world". Patient offered support to other group members as they shared their struggles with their children making good decisions and shared his experiences with his children and how one has grown up to be a Emergency planning/management officer and the other is struggling a little more.   Lulu Riding, MSW, LCSWA 02/11/2016, 8:52 AM

## 2016-02-11 NOTE — [facility] Group Notes (Signed)
[facility] LCSW Aftercare Discharge Planning Group Note   02/11/2016 11:09 AM  Participation Quality:  Patient was called to group but did not attend.   Lulu Riding, MSW, Theresia Majors  02/11/2016

## 2016-02-11 NOTE — [facility] Group Notes (Signed)
[facility] LCSW Group Therapy  02/11/2016 3:40 PM  Type of Therapy:  Group Therapy  Participation Level:  Did Not Attend  Summary of Progress/Problems: Patient was called to group but did not attend.  Lulu Riding, MSW, LCSWA 02/11/2016, 3:40 PM

## 2016-02-11 NOTE — Progress Notes (Signed)
  [facility] Adult Case Management Discharge Plan :  Will you be returning to the same living situation after discharge:  No. At discharge, do you have transportation home?: Yes,  Sheriff Do you have the ability to pay for your medications: Yes,  Mental Health   Release of information consent forms completed and in the chart;  Patient's signature needed at discharge.  Patient to Follow up at: Follow-up Information    Follow up with [facility] On 02/12/2016.   Why:  You will be transported to [facility] today by police.    Contact information:   [address] [place], [place] [address] Phone: [phone] Fax: [phone]      Next level of care provider has access to [facility] Link:no  Safety Planning and Suicide Prevention discussed: Yes,  with patient and sister   Have you used any form of tobacco in the last 30 days? (Cigarettes, Smokeless Tobacco, Cigars, and/or Pipes): Yes  Has patient been referred to the Quitline?: Patient refused referral  Patient has been referred for addiction treatment: Yes  Daisy Floro Kamani Lewter MSW, LCSWA  02/11/2016, 4:10 PM

## 2016-02-11 NOTE — Consult Note (Signed)
  Psychological Assessment   Name: Marcellis Frampton Age: 55 Date of Evaluation: 02-11-16 Test(s) Administered: Hessie Diener Gestalt  Trail Making Test Parts A & B  Reason for Referral: Mr. Rote was referred for a psychological assessment by his physician, Radene Journey, MD.  He was admitted to Behavioral Medicine from the ICU. He was found by EMS in a hotel room with ice all over his body. Subsequently her received Narcan, his pulse was lost and circulation was achieved. He was on mechanical ventilation for 12 days. He developed delirium tremens while in ICU. Toxicology screen at admission was positive for cannabis, amphetamines and tricyclics. He reported doing heroin prior to admission. Please see the history and physical for further background information. An assessment of cognitive functioning was requested.  Validity: Mr. Morren was pleasant and cooperative with the testing process. He attempted all tasks requested of him and he appeared to do his best. The present evaluation is considered a valid indication of current functioning.  Results of Testing Trail Making Test: Mr. Pickrel completed Part A in 38 seconds. The expected time for completion is 27 - 39 seconds. He completed Part B in 98 seconds. Expected time to completion for Part B is 66 - 85 seconds. Mr. Pagnotta time to completion places him in the mild/moderate impairment range.  Bender Gestalt Test: Mr. Keesey completed the figure drawing in under 15 minutes. He obtained 3 errors: Closure Difficulty, Motor Incoordination and Cohesion. His score does not suggest brain impairment.  Diagnostic Impression: Mr. Romulus cognitive screening does not suggest brain impairment.

## 2016-02-11 NOTE — Plan of Care (Signed)
Problem: Alteration in mood & ability to function due to Goal: LTG-Pt verbalizes understanding of importance of med regimen (Patient verbalizes understanding of importance of medication regimen and need to continue outpatient care and support groups)  Outcome: Progressing Patient was aware of what medication he needed and what time he needed it today

## 2016-02-11 NOTE — Progress Notes (Signed)
D:  Patient is alert and oriented on the unit this shift.  Patient attended some groups today.  Patient complains of back pain this shift.  Patient denies suicidal ideation, homicidal ideation, auditory or visual hallucinations at the present time.  Patient states his goal for today is "trying to get out a day earlier to be with grandbabies." A:  Scheduled medications are administered to patient as per MD orders.  Emotional support and encouragement are provided.  Patient is maintained on q.15 minute safety checks.  Patient is informed to notify staff with questions or concerns. R:  No adverse medication reactions are noted.  Patient is cooperative with medication administration and treatment plan today.  Patient is receptive, calm and cooperative on the unit at this time.  Patient does not interact with others on the unit this shift.  Patient contracts for safety at this time.  Patient remains safe at this time.

## 2016-02-11 NOTE — [facility] Group Notes (Signed)
[facility] Group Notes:  (Nursing/MHT/Case Management/Adjunct)  Date:  02/11/2016  Time:  4:35 PM  Type of Therapy:  Psychoeducational Skills  Participation Level:  Did Not Attend  Marquette Old 02/11/2016, 4:35 PM

## 2016-02-11 NOTE — Progress Notes (Signed)
D: Pt affect is anxious and sad this evening. Pt states his goal for today was to "focus on going home." Denies SI/HI/AVH at this time. Pt c/o chronic back pain, which he rates a 10 out of 10. Pt states "that patch they gave me doesn't do anything. I'm in pain and nothing helps." PRN medication administered upon request. A: Emotional support and encouragement provided. Medications administered with education. q15 minute safety checks maintained. R: Pt remains free from harm.

## 2016-02-11 NOTE — Progress Notes (Signed)
Recreation Therapy Notes  Date: 02.17.17 Time: 3:00 pm Location: Community Room  Group Topic: Self-expression/Coping Skills  Goal Area(s) Addresses:  Patient will effectively use art as a Associate Professor. Patient will recognize positive benefit of coping skills. Patient will be able to identify one emotion experienced during group session. Patient will identify use of art as a coping skill.  Behavioral Response: Attentive  Intervention: Two Faces of Me  Activity: Patients were given a blank face worksheet and instructed to draw or write how they felt when they were admitted into the hospital and draw or write how they want to feel when they are d/c.  Education:LRT educated patients on healthy coping skills.   Education Outcome: In group clarification offered  Clinical Observations/Feedback: Patient completed activity by writing how he felt when he was admitted and writing how he wanted to feel when he was d/c. Patient left group at approximately 3:30 pm with social work. Patient returned to group at approximately 3:40 pm. Patient did not contribute to group discussion.  Jacquelynn Cree, LRT/CTRS 02/11/2016 3:54 PM

## 2016-02-12 NOTE — Progress Notes (Signed)
D: Observed pt in day room interacting with peers. Patient alert and oriented x4. Patient denies SI/HI/AVH. Pt affect is anxious. Pt indicated he wanted to leave today, but he was looking forward to going in the morning. Pt seemed to indicate he could deal with substance abuse with needing rehab, but later stated "rehab will help in the long run." Pt rated depression 0, anxiety 0. Pt continues to c/o of chronic back pain. A: Offered active listening and support. Provided therapeutic communication. Administered scheduled medications. Encouraged pt to follow rehab program and discussed the difficulties of treating substance abuse. Gave tylenol prn. R: Pt pleasant and cooperative. Pt medication compliant. Will continue Q15 min. checks. Safety maintained.

## 2016-02-12 NOTE — Plan of Care (Signed)
Problem: Alteration in mood & ability to function due to Goal: LTG-Pt reports reduction in suicidal thoughts (Patient reports reduction in suicidal thoughts and is able to verbalize a safety plan for whenever patient is feeling suicidal)  Outcome: Progressing Pt denies SI.     

## 2016-02-12 NOTE — Tx Team (Signed)
Interdisciplinary Treatment Plan Update (Adult)  Date:  02/12/2016 Time Reviewed:  9:26 AM  Progress in Treatment: Attending groups: Yes. Participating in groups:  Yes. Taking medication as prescribed:  Yes. Tolerating medication:  Yes. Family/Significant othe contact made:  Yes, individual(s) contacted:  sister Patient understands diagnosis:  Yes. Discussing patient identified problems/goals with staff:  Yes. Medical problems stabilized or resolved:  Yes. Denies suicidal/homicidal ideation: Yes. Issues/concerns per patient self-inventory:  Yes. Other:  New problem(s) identified: No, Describe:  NA  Discharge Plan or Barriers: Pt will discharge to [facility] today.   Reason for Continuation of Hospitalization: Medication stabilization Withdrawal symptoms  Comments: Patient has been doing well in the unit. He has been participating in programming. He has appropriate interactions with his peers. He has been compliant with medications and unit rules. No behavioral issues reported. During assessment he was pleasant, calm and cooperative. Continues to be concerned and anxious about his legal charges and the possible consequences of having to go to jail as he has 2 DWIs. He denies suicidality, homicidality or having auditory or visual hallucinations. Denies major issues with his mood appetite, energy or concentration. Denies side effects from medications. Far as physical complaints the patient reports significant back pain today. I offered him Flexeril patient agrees.  Estimated length of stay: Pt will d/c today.   New goal(s): NA  Review of initial/current patient goals per problem list:   1.  Goal(s): Patient will participate in aftercare plan * Met: 02/12/2016  * Target date: at discharge * As evidenced by: Patient will participate within aftercare plan AEB aftercare provider and housing plan at discharge being identified.   2.  Goal (s): Patient will exhibit decreased depressive symptoms  and suicidal ideations. * Met: 02/12/2016  *  Target date: at discharge * As evidenced by: Patient will utilize self rating of depression at 3 or below and demonstrate decreased signs of depression or be deemed stable for discharge by MD.   3.  Goal(s): Patient will demonstrate decreased signs and symptoms of anxiety. * Met: 02/12/2016  * Target date: at discharge * As evidenced by: Patient will utilize self rating of anxiety at 3 or below and demonstrated decreased signs of anxiety, or be deemed stable for discharge by MD   4.  Goal(s): Patient will demonstrate decreased signs of withdrawal due to substance abuse * Met: 02/12/2016  * Target date: at discharge * As evidenced by: Patient will produce a CIWA/COWS score of 0, have stable vitals signs, and no symptoms of withdrawal.  5.  Goal (s): Patient will demonstrate decreased symptoms of psychosis. * Met: 02/12/2016  *  Target date: at discharge * As evidenced by: Patient will not endorse signs of psychosis or be deemed stable for discharge by MD. *   Attendees: Patient:  Duane Hayes 2/18/20179:26 AM  Family:   2/18/20179:26 AM  Physician:  Dr. Jerilee Hoh   2/18/20179:26 AM  Nursing:   Jesus Genera, RN  2/18/20179:26 AM  Case Manager:   2/18/20179:26 AM  Counselor:   2/18/20179:26 AM  Other:  Wray Kearns, [place] 2/18/20179:26 AM  Other:   2/18/20179:26 AM  Other:   2/18/20179:26 AM  Other:  2/18/20179:26 AM  Other:  2/18/20179:26 AM  Other:  2/18/20179:26 AM  Other:  2/18/20179:26 AM  Other:  2/18/20179:26 AM  Other:  2/18/20179:26 AM  Other:   2/18/20179:26 AM   Scribe for Treatment Team:   Wray Kearns, MSW, LCSWA  02/12/2016, 9:26 AM

## 2016-02-12 NOTE — Progress Notes (Signed)
Pt discharged from behavior med. To be transported to [facility]. Pt denies SI and A/V hallucinations.All  Belongings returned to pt.

## 2016-02-14 NOTE — Progress Notes (Signed)
Recreation Therapy Notes  INPATIENT RECREATION TR PLAN  Patient Details Name: Duane Hayes MRN: [id number] DOB: 21-May-1961 Today's Date: 02/14/2016  Rec Therapy Plan Is patient appropriate for Therapeutic Recreation?: Yes Treatment times per week: At least once a week TR Treatment/Interventions: 1:1 session, Group participation (Comment) (Appropriate participation in daily recreation therapy tx)  Discharge Criteria Pt will be discharged from therapy if:: Treatment goals are met, Discharged Treatment plan/goals/alternatives discussed and agreed upon by:: Patient/family  Discharge Summary Short term goals set: See Care Plan Short term goals met: Complete Progress toward goals comments: One-to-one attended Which groups?: Self-esteem, Goal setting, Leisure education, Coping skills, Other (Comment) (Self-expression) One-to-one attended: Self-esteem, coping skills Reason goals not met: N/A Therapeutic equipment acquired: None Reason patient discharged from therapy: Discharge from hospital Pt/family agrees with progress & goals achieved: Yes Date patient discharged from therapy: 02/12/16   Leonette Monarch, LRT/CTRS 02/14/2016, 11:30 AM

## 2016-05-25 ENCOUNTER — Encounter: Payer: Self-pay | Admitting: *Deleted

## 2016-05-25 ENCOUNTER — Emergency Department: Payer: Medicaid Other

## 2016-05-25 ENCOUNTER — Inpatient Hospital Stay: Payer: Medicaid Other

## 2016-05-25 ENCOUNTER — Inpatient Hospital Stay
Admission: EM | Admit: 2016-05-25 | Discharge: 2016-06-24 | DRG: 296 | Disposition: E | Payer: Medicaid Other | Attending: Internal Medicine | Admitting: Internal Medicine

## 2016-05-25 ENCOUNTER — Emergency Department
Admission: EM | Admit: 2016-05-25 | Discharge: 2016-05-25 | Disposition: A | Discharge status: Still In Hospital | Payer: Medicaid Other | Source: Home / Self Care | Attending: Emergency Medicine | Admitting: Emergency Medicine

## 2016-05-25 DIAGNOSIS — F1010 Alcohol abuse, uncomplicated: Secondary | ICD-10-CM | POA: Diagnosis present

## 2016-05-25 DIAGNOSIS — G935 Compression of brain: Secondary | ICD-10-CM | POA: Diagnosis present

## 2016-05-25 DIAGNOSIS — I469 Cardiac arrest, cause unspecified: Secondary | ICD-10-CM

## 2016-05-25 DIAGNOSIS — R748 Abnormal levels of other serum enzymes: Secondary | ICD-10-CM | POA: Diagnosis present

## 2016-05-25 DIAGNOSIS — I10 Essential (primary) hypertension: Secondary | ICD-10-CM | POA: Diagnosis present

## 2016-05-25 DIAGNOSIS — R0600 Dyspnea, unspecified: Secondary | ICD-10-CM

## 2016-05-25 DIAGNOSIS — R358 Other polyuria: Secondary | ICD-10-CM | POA: Diagnosis present

## 2016-05-25 DIAGNOSIS — Z515 Encounter for palliative care: Secondary | ICD-10-CM | POA: Diagnosis not present

## 2016-05-25 DIAGNOSIS — F1490 Cocaine use, unspecified, uncomplicated: Secondary | ICD-10-CM | POA: Diagnosis present

## 2016-05-25 DIAGNOSIS — F17210 Nicotine dependence, cigarettes, uncomplicated: Secondary | ICD-10-CM | POA: Diagnosis present

## 2016-05-25 DIAGNOSIS — F1390 Sedative, hypnotic, or anxiolytic use, unspecified, uncomplicated: Secondary | ICD-10-CM | POA: Diagnosis present

## 2016-05-25 DIAGNOSIS — I959 Hypotension, unspecified: Secondary | ICD-10-CM | POA: Diagnosis present

## 2016-05-25 DIAGNOSIS — E876 Hypokalemia: Secondary | ICD-10-CM | POA: Diagnosis present

## 2016-05-25 DIAGNOSIS — J189 Pneumonia, unspecified organism: Secondary | ICD-10-CM | POA: Diagnosis not present

## 2016-05-25 DIAGNOSIS — Z66 Do not resuscitate: Secondary | ICD-10-CM | POA: Diagnosis not present

## 2016-05-25 DIAGNOSIS — R739 Hyperglycemia, unspecified: Secondary | ICD-10-CM | POA: Diagnosis present

## 2016-05-25 DIAGNOSIS — N179 Acute kidney failure, unspecified: Secondary | ICD-10-CM | POA: Diagnosis present

## 2016-05-25 DIAGNOSIS — E872 Acidosis, unspecified: Secondary | ICD-10-CM

## 2016-05-25 DIAGNOSIS — E232 Diabetes insipidus: Secondary | ICD-10-CM | POA: Diagnosis present

## 2016-05-25 DIAGNOSIS — G931 Anoxic brain damage, not elsewhere classified: Secondary | ICD-10-CM | POA: Diagnosis present

## 2016-05-25 DIAGNOSIS — F1910 Other psychoactive substance abuse, uncomplicated: Secondary | ICD-10-CM

## 2016-05-25 DIAGNOSIS — R402432 Glasgow coma scale score 3-8, at arrival to emergency department: Secondary | ICD-10-CM | POA: Diagnosis present

## 2016-05-25 DIAGNOSIS — J9601 Acute respiratory failure with hypoxia: Secondary | ICD-10-CM | POA: Diagnosis present

## 2016-05-25 DIAGNOSIS — G936 Cerebral edema: Secondary | ICD-10-CM | POA: Diagnosis present

## 2016-05-25 DIAGNOSIS — R4189 Other symptoms and signs involving cognitive functions and awareness: Secondary | ICD-10-CM

## 2016-05-25 DIAGNOSIS — F1599 Other stimulant use, unspecified with unspecified stimulant-induced disorder: Secondary | ICD-10-CM | POA: Diagnosis present

## 2016-05-25 DIAGNOSIS — Z4659 Encounter for fitting and adjustment of other gastrointestinal appliance and device: Secondary | ICD-10-CM

## 2016-05-25 DIAGNOSIS — R579 Shock, unspecified: Secondary | ICD-10-CM | POA: Diagnosis present

## 2016-05-25 DIAGNOSIS — J9600 Acute respiratory failure, unspecified whether with hypoxia or hypercapnia: Secondary | ICD-10-CM

## 2016-05-25 DIAGNOSIS — D72829 Elevated white blood cell count, unspecified: Secondary | ICD-10-CM

## 2016-05-25 LAB — URINE DRUG SCREEN, QUALITATIVE (ARMC ONLY)
Amphetamines, Ur Screen: NOT DETECTED
BARBITURATES, UR SCREEN: NOT DETECTED
Benzodiazepine, Ur Scrn: POSITIVE — AB
CANNABINOID 50 NG, UR ~~LOC~~: POSITIVE — AB
COCAINE METABOLITE, UR ~~LOC~~: NOT DETECTED
MDMA (Ecstasy)Ur Screen: NOT DETECTED
METHADONE SCREEN, URINE: NOT DETECTED
OPIATE, UR SCREEN: NOT DETECTED
Phencyclidine (PCP) Ur S: NOT DETECTED
Tricyclic, Ur Screen: NOT DETECTED

## 2016-05-25 LAB — URINALYSIS COMPLETE WITH MICROSCOPIC (ARMC ONLY)
BILIRUBIN URINE: NEGATIVE
Bacteria, UA: NONE SEEN
KETONES UR: NEGATIVE mg/dL
Leukocytes, UA: NEGATIVE
Nitrite: NEGATIVE
PH: 6 (ref 5.0–8.0)
Protein, ur: 100 mg/dL — AB
Specific Gravity, Urine: 1.01 (ref 1.005–1.030)

## 2016-05-25 LAB — BLOOD GAS, ARTERIAL
ACID-BASE DEFICIT: 12.7 mmol/L — AB (ref 0.0–2.0)
Allens test (pass/fail): POSITIVE — AB
BICARBONATE: 18.4 meq/L — AB (ref 21.0–28.0)
FIO2: 1
O2 SAT: 99.1 %
PATIENT TEMPERATURE: 37.1
PCO2 ART: 65 mmHg — AB (ref 32.0–48.0)
PEEP: 5 cmH2O
PH ART: 7.06 — AB (ref 7.350–7.450)
RATE: 16 resp/min
VT: 500 mL
pO2, Arterial: 179 mmHg — ABNORMAL HIGH (ref 83.0–108.0)

## 2016-05-25 LAB — COMPREHENSIVE METABOLIC PANEL
ALK PHOS: 104 U/L (ref 38–126)
ALT: 95 U/L — AB (ref 17–63)
AST: 88 U/L — AB (ref 15–41)
Albumin: 3.6 g/dL (ref 3.5–5.0)
Anion gap: 17 — ABNORMAL HIGH (ref 5–15)
BUN: 14 mg/dL (ref 6–20)
CALCIUM: 8.7 mg/dL — AB (ref 8.9–10.3)
CO2: 19 mmol/L — AB (ref 22–32)
CREATININE: 1.32 mg/dL — AB (ref 0.61–1.24)
Chloride: 103 mmol/L (ref 101–111)
GFR calc Af Amer: >60 mL/min (ref >60)
GFR, EST NON AFRICAN AMERICAN: 59 mL/min — AB (ref >60)
Glucose, Bld: 354 mg/dL — ABNORMAL HIGH (ref 65–99)
Potassium: 2.8 mmol/L — CL (ref 3.5–5.1)
Sodium: 139 mmol/L (ref 135–145)
Total Bilirubin: 0.3 mg/dL (ref 0.3–1.2)
Total Protein: 6.4 g/dL — ABNORMAL LOW (ref 6.5–8.1)

## 2016-05-25 LAB — CBC WITH DIFFERENTIAL/PLATELET
Basophils Absolute: 0.2 10*3/uL — ABNORMAL HIGH (ref 0–0.1)
Basophils Relative: 1 %
EOS PCT: 2 %
Eosinophils Absolute: 0.4 10*3/uL (ref 0–0.7)
HCT: 39.6 % — ABNORMAL LOW (ref 40.0–52.0)
Hemoglobin: 12.7 g/dL — ABNORMAL LOW (ref 13.0–18.0)
LYMPHS ABS: 9.6 10*3/uL — AB (ref 1.0–3.6)
Lymphocytes Relative: 51 %
MCH: 32.1 pg (ref 26.0–34.0)
MCHC: 32 g/dL (ref 32.0–36.0)
MCV: 100.3 fL — AB (ref 80.0–100.0)
MONO ABS: 1.3 10*3/uL — AB (ref 0.2–1.0)
MONOS PCT: 7 %
NEUTROS ABS: 7.4 10*3/uL — AB (ref 1.4–6.5)
Neutrophils Relative %: 39 %
PLATELETS: 286 10*3/uL (ref 150–440)
RBC: 3.94 MIL/uL — ABNORMAL LOW (ref 4.40–5.90)
RDW: 16.2 % — AB (ref 11.5–14.5)
WBC: 18.9 10*3/uL — AB (ref 3.8–10.6)

## 2016-05-25 LAB — ETHANOL: Alcohol, Ethyl (B): <5 mg/dL (ref <5)

## 2016-05-25 LAB — GLUCOSE, CAPILLARY
Glucose-Capillary: 325 mg/dL — ABNORMAL HIGH (ref 65–99)
Glucose-Capillary: 129 mg/dL — ABNORMAL HIGH (ref 65–99)
Glucose-Capillary: 79 mg/dL (ref 65–99)
Glucose-Capillary: 140 mg/dL — ABNORMAL HIGH (ref 65–99)

## 2016-05-25 LAB — LACTIC ACID, PLASMA
LACTIC ACID, VENOUS: 1.6 mmol/L (ref 0.5–2.0)
Lactic Acid, Venous: 6.7 mmol/L (ref 0.5–2.0)

## 2016-05-25 LAB — PROTIME-INR
INR: 1.23
PROTHROMBIN TIME: 15.7 s — AB (ref 11.4–15.0)

## 2016-05-25 LAB — TROPONIN I: Troponin I: <0.03 ng/mL (ref <0.031)

## 2016-05-25 LAB — MRSA PCR SCREENING: MRSA by PCR: NEGATIVE

## 2016-05-25 LAB — TRIGLYCERIDES: Triglycerides: 158 mg/dL — ABNORMAL HIGH (ref <150)

## 2016-05-25 MED ORDER — HEPARIN SODIUM (PORCINE) 5000 UNIT/ML IJ SOLN
5000.0000 [IU] | Freq: Three times a day (TID) | INTRAMUSCULAR | Status: DC
  Administered 2016-05-25 – 2016-05-28 (×10): 5000 [IU] via SUBCUTANEOUS
  Filled 2016-05-25 (×11): qty 1

## 2016-05-25 MED ORDER — POTASSIUM CHLORIDE 2 MEQ/ML IV SOLN
INTRAVENOUS | Status: DC
  Administered 2016-05-25 – 2016-05-28 (×6): via INTRAVENOUS
  Filled 2016-05-25 (×10): qty 1000

## 2016-05-25 MED ORDER — PANTOPRAZOLE SODIUM 40 MG IV SOLR: 40.0000 mg | Freq: Every day | INTRAVENOUS | Status: DC

## 2016-05-25 MED ORDER — ANTISEPTIC ORAL RINSE SOLUTION (CORINZ)
7.0000 mL | Freq: Four times a day (QID) | OROMUCOSAL | Status: DC
  Administered 2016-05-25 – 2016-05-26 (×5): 7 mL via OROMUCOSAL
  Filled 2016-05-25 (×5): qty 7

## 2016-05-25 MED ORDER — POTASSIUM CHLORIDE 10 MEQ/100ML IV SOLN
10.0000 meq | INTRAVENOUS | Status: AC
  Administered 2016-05-25 (×4): 10 meq via INTRAVENOUS
  Filled 2016-05-25 (×4): qty 100

## 2016-05-25 MED ORDER — SODIUM CHLORIDE 0.9 % IV SOLN: 250.0000 mL | INTRAVENOUS | Status: DC | PRN

## 2016-05-25 MED ORDER — SODIUM CHLORIDE 0.9 % IV SOLN: Freq: Once | INTRAVENOUS | Status: DC

## 2016-05-25 MED ORDER — ASPIRIN 300 MG RE SUPP: 300.0000 mg | RECTAL | Status: DC

## 2016-05-25 MED ORDER — FAMOTIDINE IN NACL 20-0.9 MG/50ML-% IV SOLN
20.0000 mg | Freq: Two times a day (BID) | INTRAVENOUS | Status: DC
  Administered 2016-05-25 – 2016-05-28 (×6): 20 mg via INTRAVENOUS
  Filled 2016-05-25 (×10): qty 50

## 2016-05-25 MED ORDER — ASPIRIN 81 MG PO CHEW: 324.0000 mg | CHEWABLE_TABLET | ORAL | Status: DC

## 2016-05-25 MED ORDER — PROPOFOL 1000 MG/100ML IV EMUL
0.0000 ug/kg/min | INTRAVENOUS | Status: DC
  Administered 2016-05-28: 10 ug/kg/min via INTRAVENOUS
  Filled 2016-05-25: qty 100

## 2016-05-25 MED ORDER — INSULIN ASPART 100 UNIT/ML ~~LOC~~ SOLN
0.0000 [IU] | SUBCUTANEOUS | Status: DC
  Administered 2016-05-25: 2 [IU] via SUBCUTANEOUS
  Administered 2016-05-27 (×2): 3 [IU] via SUBCUTANEOUS
  Administered 2016-05-27: 2 [IU] via SUBCUTANEOUS
  Administered 2016-05-27 (×2): 3 [IU] via SUBCUTANEOUS
  Administered 2016-05-27: 2 [IU] via SUBCUTANEOUS
  Administered 2016-05-28 (×2): 3 [IU] via SUBCUTANEOUS
  Administered 2016-05-28 (×2): 5 [IU] via SUBCUTANEOUS
  Filled 2016-05-25 (×2): qty 3
  Filled 2016-05-25: qty 2
  Filled 2016-05-25: qty 5
  Filled 2016-05-25: qty 2
  Filled 2016-05-25: qty 3
  Filled 2016-05-25: qty 5
  Filled 2016-05-25 (×2): qty 3
  Filled 2016-05-25: qty 2
  Filled 2016-05-25: qty 3

## 2016-05-25 MED ORDER — SODIUM CHLORIDE 0.9 % IV SOLN
INTRAVENOUS | Status: AC
  Administered 2016-05-25: 11:00:00
  Filled 2016-05-25: qty 1000

## 2016-05-25 MED ORDER — CHLORHEXIDINE GLUCONATE 0.12% ORAL RINSE (MEDLINE KIT)
15.0000 mL | Freq: Two times a day (BID) | OROMUCOSAL | Status: DC
Start: 2016-05-25 — End: 2016-05-28
  Administered 2016-05-25 – 2016-05-28 (×6): 15 mL via OROMUCOSAL
  Filled 2016-05-25 (×7): qty 15

## 2016-05-25 NOTE — Progress Notes (Signed)
Patient transported to CT on transport vent.  Patient tolerated transport well.

## 2016-05-25 NOTE — ED Provider Notes (Signed)
Filed Vitals:   06/01/2016 1000 06/11/2016 1020  BP: 154/93 98/66  Pulse: 95 89  Resp: 20 20   Labs Reviewed  CBC WITH DIFFERENTIAL/PLATELET - Abnormal; Notable for the following:    WBC 18.9 (*)    RBC 3.94 (*)    Hemoglobin 12.7 (*)    HCT 39.6 (*)    MCV 100.3 (*)    RDW 16.2 (*)    All other components within normal limits  URINALYSIS COMPLETEWITH MICROSCOPIC ([facility] ONLY) - Abnormal; Notable for the following:    Color, Urine YELLOW (*)    APPearance CLOUDY (*)    Glucose, UA >500 (*)    Hgb urine dipstick 1+ (*)    Protein, ur 100 (*)    Squamous Epithelial / LPF 0-5 (*)    All other components within normal limits  PROTIME-INR - Abnormal; Notable for the following:    Prothrombin Time 15.7 (*)    All other components within normal limits  URINE CULTURE  ETHANOL  LACTIC ACID, PLASMA  LACTIC ACID, PLASMA  COMPREHENSIVE METABOLIC PANEL  TROPONIN I  BLOOD GAS, ARTERIAL  URINE DRUG SCREEN, QUALITATIVE ([facility] ONLY)   Patient has maintained a heart rate and blood pressure that has been adequate for perfusion. I discussed with the intensivist who recommends not continuing code eyes protocol. He will be admitted to the intensive care unit for further evaluation.  Emily FilbertJonathan E Shaunn Tackitt, MD 06/09/2016 1059

## 2016-05-25 NOTE — ED Notes (Signed)
Offer Snow with [employer] at the bedside, tries to speak with pt son who refuses.

## 2016-05-25 NOTE — ED Notes (Signed)
Pt intubated by Dr. Mayford KnifeWilliams with RT assist, 22@lip  with 7.5Fr.

## 2016-05-25 NOTE — ED Notes (Signed)
Pt sister is at the bedside

## 2016-05-25 NOTE — Care Management (Signed)
Patient had similar presentation to ED Jan 2017.  His daughter in law found him today in a bath tub.  he was unresponsive.  Is intubated and on mechanical vent.  Hisotry of poly substance abuse.  Urine positive for cannabis and benzos.

## 2016-05-25 NOTE — ED Provider Notes (Deleted)
Patient is maintaining a blood pressure with a map greater than 65. Heart rate and oxygenation are adequate at this time. Code eyes protocol was discussed but was decided that he may have some contraindications. Patient has been discussed with the intensivist for admission.  Emily FilbertJonathan E Williams, MD 05/26/2016 1051  Emily FilbertJonathan E Williams, MD 06/21/2016 [phone]

## 2016-05-25 NOTE — ED Provider Notes (Signed)
[facility] Emergency Department Provider Note        Time seen: ----------------------------------------- 9:33 AM on 06/19/2016 -----------------------------------------  L5 caveat: Review of systems and history is limited by unresponsiveness  I have reviewed the triage vital signs and the nursing notes.   HISTORY  Chief Complaint Cardiac Arrest    HPI Duane Hayes is a [age] y.o. unknown patient brought in status post arrest. Patient was found unresponsive, pulseless and apneic [facility]. Family believes he uses illicit substances but is unsure which ones. There was alcohol found near to the body as well as a bag containing a white substance. EMS had given the patient 5 mg of epinephrine and multiple doses of Narcan in route.   No past medical history on file.  There are no active problems to display for this patient.   No past surgical history on file.  Allergies Review of patient's allergies indicates not on file.  Social History Social History  Substance Use Topics  . Smoking status: Not on file  . Smokeless tobacco: Not on file  . Alcohol Use: Not on file    Review of Systems Unknown at this time  ____________________________________________   PHYSICAL EXAM:  VITAL SIGNS: ED Triage Vitals  Enc Vitals Group     BP --      Pulse --      Resp --      Temp --      Temp src --      SpO2 --      Weight --      Height --      Head Cir --      Peak Flow --      Pain Score --      Pain Loc --      Pain Edu? --      Excl. in GC? --     Constitutional: Unresponsive Eyes: Conjunctivae are injected, he was fixed and dilated bilaterally, 6 mm ENT   Head: Normocephalic and atraumatic.   Nose: No congestion/rhinnorhea.   Mouth/Throat: ET tube is in place   Neck: No stridor Cardiovascular: Normal rate, regular rhythm. No murmurs, rubs, or gallops. Respiratory: Normal respiratory effort without tachypnea nor  retractions. Breath sounds are clear and equal bilaterally. No wheezes/rales/rhonchi. Patient is intubated Gastrointestinal: Normal bowel sounds Musculoskeletal: normal range of motion in all extremities. No edema. Neurologic:  GCS is 3T Skin:  Skin is warm, dry and intact. No rash noted. ____________________________________________  EKG: Interpreted by me. Sinus rhythm with a rate of 100 bpm, normal PR interval, normal QRS, normal QT interval. Normal axis, ST depression is noted inferiorly and anterolaterally  ____________________________________________  ED COURSE:  Pertinent labs & imaging results that were available during my care of the patient were reviewed by me and considered in my medical decision making (see chart for details). Patient presents unresponsive status post arrest. CPR was utilized in the prehospital setting, patient was given IV epinephrine as well as IV Narcan. Patient had return of spontaneous circulation prior to arrival. We will assess the patient with basic labs and imaging, consider code ice protocol  INTUBATION Performed by: Daryel NovemberWilliams, Marceline Napierala E  Required items: required blood products, implants, devices, and special equipment available Patient identity confirmed: provided demographic data and hospital-assigned identification number Time out: Immediately prior to procedure a "time out" was called to verify the correct patient, procedure, equipment, support staff and site/side marked as required.  Indications: Post arrest,  change ET tube from [facility] airway to regular ET tube   Intubation method: Glidescope Laryngoscopy   Preoxygenation: Brooke Dare airway   Sedatives:  None   Tube Size: 7.5 cuffed  Post-procedure assessment: chest rise and ETCO2 monitor Breath sounds: equal and absent over the epigastrium Tube secured with: ETT holder Chest x-ray interpreted by radiologist and me.  Chest x-ray findings: endotracheal tube in appropriate position  Patient  tolerated the procedure well with no immediate complications.   ____________________________________________    LABS (pertinent positives/negatives)  Labs Reviewed - No data to display CRITICAL CARE Performed by: Emily Filbert   Total critical care time: 30 minutes  Critical care time was exclusive of separately billable procedures and treating other patients.  Critical care was necessary to treat or prevent imminent or life-threatening deterioration.  Critical care was time spent personally by me on the following activities: development of treatment plan with patient and/or surrogate as well as nursing, discussions with consultants, evaluation of patient's response to treatment, examination of patient, obtaining history from patient or surrogate, ordering and performing treatments and interventions, ordering and review of laboratory studies, ordering and review of radiographic studies, pulse oximetry and re-evaluation of patient's condition.   RADIOLOGY Images were viewed by me  Chest x-ray, Review of ET tube in proper position ____________________________________________  FINAL ASSESSMENT AND PLAN  Status post arrest  Plan: Patient with labs and imaging as dictated above. There are multiple stories as to how the patient was found. Please see additional notes dictated in sequence.   Emily Filbert, MD   Note: This dictation was prepared with Dragon dictation. Any transcriptional errors that result from this process are unintentional   Emily Filbert, MD 06-06-2016 1521

## 2016-05-25 NOTE — ED Notes (Signed)
Ice packs applied and cold fluids infusing per EDP.

## 2016-05-25 NOTE — H&P (Addendum)
PULMONARY / CRITICAL CARE MEDICINE   Name: Duane Hayes MRN: [id number] DOB: 24-Aug-1961    ADMISSION DATE:  May 28, 2016 CONSULTATION DATE:  May 28, 2016  REFERRING MD:  EDP  CHIEF COMPLAINT:  Post cardiac arrest  HISTORY OF PRESENT ILLNESS:   Duane Hayes, is 55 year old male with known history of polysubstance abuse was [place] tub for unknown period of time.Daughter in law, found the patient not breathing well, therefore she called 911 and started CPR.Care  Nurse states that patient received 4mg  of narcan and 5 doses of epinephrine prior to arrival , they performed CPR for a total of 6 minutes prior to ROSC.Marland Kitchen Patient was intubated by Dr. Mayford Knife in the ED and PCCM team was consulted for further management.  PAST MEDICAL HISTORY :  He  has a past medical history of Arthritis.  PAST SURGICAL HISTORY: He  has past surgical history that includes Shoulder surgery.  Not on File  No current facility-administered medications on file prior to encounter.   Current Outpatient Prescriptions on File Prior to Encounter  Medication Sig  . cyclobenzaprine (FLEXERIL) 10 MG tablet Take 1 tablet (10 mg total) by mouth 2 (two) times daily as needed for muscle spasms.  Marland Kitchen lidocaine (LIDODERM) 5 % Place 1 patch onto the skin daily. Remove & Discard patch within 12 hours or as directed by MD  . traZODone (DESYREL) 150 MG tablet Take 1 tablet (150 mg total) by mouth at bedtime.    FAMILY HISTORY:  His indicated that his mother is deceased. He indicated that his father is deceased.   SOCIAL HISTORY: He  reports that he has been smoking Cigarettes.  He has been smoking about 1.00 pack per day. He does not have any smokeless tobacco history on file. He reports that he drinks alcohol. He reports that he uses illicit drugs (Cocaine).  REVIEW OF SYSTEMS:   Unable to obtain  SUBJECTIVE:  Unable to obtain  VITAL SIGNS: BP 100/70 mmHg  Pulse 85  Temp(Src) 95.7 F  (35.4 C)  Resp 19  SpO2 100%  HEMODYNAMICS:    VENTILATOR SETTINGS: Vent Mode:  [-] AC FiO2 (%):  [60 %] 60 % Set Rate:  [16 bmp] 16 bmp Vt Set:  [500 mL] 500 mL PEEP:  [5 cmH20] 5 cmH20  INTAKE / OUTPUT:    PHYSICAL EXAMINATION: General:  White male, found to be intubated and on mechanical ventilation Neuro: OBTUNDED HEENT:  Atraumatic, normocephalic, pinpoint pupils, no JVD appreciated. Cardiovascular: S1S2, regular, no MRG noted Lungs: clear bilaterally, no wheezes, crackles, rhonchi noted at this time. Abdomen:  Soft, non tender, Musculoskeletal:  No inflammation/ deformity noted Skin: grossly intact.  LABS:  BMET  Recent Labs Lab 05/30/2016 0943  NA 139  K 2.8*  CL 103  CO2 19*  BUN 14  CREATININE 1.32*  GLUCOSE 354*    Electrolytes  Recent Labs Lab 28-May-2016 0943  CALCIUM 8.7*    CBC  Recent Labs Lab 28-May-2016 0943  WBC 18.9*  HGB 12.7*  HCT 39.6*  PLT 286    Coag's  Recent Labs Lab May 28, 2016 0943  INR 1.23    Sepsis Markers  Recent Labs Lab 06/05/2016 1000  LATICACIDVEN 6.7*    ABG  Recent Labs Lab May 28, 2016 0925  PHART 7.06*  PCO2ART 65*  PO2ART 179*    Liver Enzymes  Recent Labs Lab May 28, 2016 0943  AST 88*  ALT 95*  ALKPHOS 104  BILITOT 0.3  ALBUMIN 3.6    Cardiac Enzymes  Recent Labs Lab 06/04/2016 0943  TROPONINI <0.03    Glucose No results for input(s): GLUCAP in the last 168 hours.  Imaging Ct Head Wo Contrast  06/16/2016  CLINICAL DATA:  Found unresponsive. CPR for 6 minutes. Possible drug overdose. EXAM: CT HEAD WITHOUT CONTRAST TECHNIQUE: Contiguous axial images were obtained from the base of the skull through the vertex without intravenous contrast. COMPARISON:  CT head without contrast 01/21/2016. FINDINGS: No acute cortical infarct, hemorrhage, or mass lesion is present. The ventricles are of normal size. No significant extra-axial fluid collection is evident. The paranasal sinuses and mastoid air  cells are clear. The calvarium is intact. The globes and orbits are intact. No significant extracranial soft tissue lesions are present. IMPRESSION: Negative CT of the head. Electronically Signed   By: Marin Robertshristopher  Mattern M.D.   On: 06/20/2016 11:37   Dg Chest Port 1 View  05/30/2016  CLINICAL DATA:  Hypoxia EXAM: PORTABLE CHEST 1 VIEW COMPARISON:  January 31, 2016 FINDINGS: Endotracheal tube tip is 4.2 cm above the carina. No pneumothorax. There are occasional small calcified granulomas. There is no edema or consolidation. Heart size is upper normal with pulmonary vascularity within normal limits. No adenopathy evident. There is degenerative change in each shoulder. IMPRESSION: Endotracheal tube as described without pneumothorax. Scattered small granulomas. No edema or consolidation. Stable cardiac silhouette. Electronically Signed   By: Bretta BangWilliam  Woodruff III M.D.   On: 06/21/2016 10:35     STUDIES:  6/1> 6/1uds> positive for Benzodiazipine, cannabinoid CULTURES:  6/1 urine culture  ANTIBIOTICS: none  SIGNIFICANT EVENTS: 6/1> post cardiac arrest intubated and on mechanical ventilation.  LINES/TUBES: none  DISCUSSION: 55 year old male with known history of polysubstance abuse was found down for unknown period of time, received CPR for 6 minutes now intubated and mechanically ventilated.   ASSESSMENT / PLAN:  PULMONARY A: Acute respiratory failure post cardiopulmonary arrest P:   Vent settings established Vent bundle implemented Daily SBT as indicated  CARDIOVASCULAR A:  S/p cardiac arrest P:  Continuous telemetry MAP> than 65 Trend troponin  RENAL A:  Acute kidney injury  Hypokalemia P:   Monitor BMET intermittently Monitor I/Os Correct electrolytes as indicated  GASTROINTESTINAL A:   Elevated liver enzymes related to alcohol abuse P:   SUP: IV PPI May start tube feeding tomorrow Trend AST, ALT  HEMATOLOGIC A:   No active issues P:  DVT px: SQ  heparin Monitor CBC intermittently  INFECTIOUS A:   Leukocytosis - likely stress response  P:   Monitor temp, WBC count Micro and abx as above  ENDOCRINE A:   Hyperglycemia without prior dx of DM P: Mod scale SSI ordered  NEUROLOGIC A:   Acute encephalopathy - toxic and/or ischemic P:   RASS goal: 0 to -1  6/1 CT head negative  EEG ordered Propofol ordered if needed   Bincy Varughese,AG-ACNP Pulmonary & Critical Care In the is that the nasal [facility]   06/23/2016, 12:52 PM   PCCM ATTENDING ATTESTATION:  I have evaluated patient with Bincy Varughese,AG-ACNP, reviewed database in its entirety and discussed care plan in detail. In addition, this patient was discussed on multidisciplinary rounds.   Important exam findings: Comatose Myoclonic appearing eye opening with upward gaze Pupils pinpoint No spontaneous limb movements 3 beat ankle clonus on R DTRs otherwise not increased  PLAN/REC: I have amended the impression and plan above and concur with it as documented   CCM time:  35 mins The above time includes time spent in consultation with patient and/or family members and reviewing care plan on multidisciplinary rounds  Billy Fischer, MD PCCM service Mobile [phone] Pager [phone]

## 2016-05-25 NOTE — ED Notes (Signed)
EDP discontinue cold therapy and begin warming pt.. States pt has unknown down time..Marland Kitchen

## 2016-05-25 NOTE — ED Notes (Addendum)
EMS was called [place] [facility], states son found pt on the floor in the hallway.Duane Hayes. EMS reports pt was unresponsive on arrival and they performed CPR for 6min, administered 4 of narcan, epi x5. ptis ST on arrival 100-115, pt has king airway on arrival.. Family reported to EMS that the pt uses any drugs he can get his hands on..Duane Hayes

## 2016-05-25 NOTE — ED Notes (Signed)
Bear hugger applied 

## 2016-05-25 NOTE — Progress Notes (Signed)
Inpatient Diabetes Program Recommendations  AACE/ADA: New Consensus Statement on Inpatient Glycemic Control (2015)  Target Ranges:  Prepandial:   less than 140 mg/dL      Peak postprandial:   less than 180 mg/dL (1-2 hours)      Critically ill patients:  140 - 180 mg/dL  Results for Duane Hayes, Haidan G (MRN [id number]) as of 05/27/2016 12:54  Ref. Range 06/15/2016 09:43  Glucose Latest Ref Range: 65-99 mg/dL 409354 (H)    Review of Glycemic Control  Diabetes history: No Outpatient Diabetes medications: NA Current orders for Inpatient glycemic control: Novolog 0-15 units Q4H  Inpatient Diabetes Program Recommendations: HgbA1C: Last A1C in the chart was 5.6% on 01/21/2016. Please consider adding an A1C on to blood in lab to evaluate glcyemic control over the past 2-3 months.  Thanks, Orlando PennerMarie Avrohom Mckelvin, RN, MSN, CDE Diabetes Coordinator Inpatient Diabetes Program [phone] (Team Pager from 8am to 5pm) [phone] ([facility] office) [phone] [facility] office) [phone] [facility] office)

## 2016-05-25 NOTE — ED Notes (Signed)
Daughter N law at the bedside, states she found the pt in the floor not breathing well, states he was like snoring.. States she called 911 and they told her to lay the pt flat and to start doing compressions..Marland Kitchen

## 2016-05-25 NOTE — ED Notes (Signed)
The pt son is at the bedside and the pt a friend of the family (previous fiancee' son)_whom states he found the pt in a hot steaming bath not breathing well.the patient arrived with clean dry shorts on, black and white plaid shorts.

## 2016-05-26 ENCOUNTER — Inpatient Hospital Stay: Payer: Medicaid Other

## 2016-05-26 DIAGNOSIS — F1910 Other psychoactive substance abuse, uncomplicated: Secondary | ICD-10-CM | POA: Insufficient documentation

## 2016-05-26 LAB — GLUCOSE, CAPILLARY
GLUCOSE-CAPILLARY: 102 mg/dL — AB (ref 65–99)
GLUCOSE-CAPILLARY: 104 mg/dL — AB (ref 65–99)
GLUCOSE-CAPILLARY: 114 mg/dL — AB (ref 65–99)
GLUCOSE-CAPILLARY: 119 mg/dL — AB (ref 65–99)
GLUCOSE-CAPILLARY: 77 mg/dL (ref 65–99)
GLUCOSE-CAPILLARY: 99 mg/dL (ref 65–99)
Glucose-Capillary: 103 mg/dL — ABNORMAL HIGH (ref 65–99)

## 2016-05-26 LAB — URINE CULTURE: Culture: NO GROWTH

## 2016-05-26 LAB — CBC
HEMATOCRIT: 35.2 % — AB (ref 40.0–52.0)
Hemoglobin: 11.6 g/dL — ABNORMAL LOW (ref 13.0–18.0)
MCH: 32 pg (ref 26.0–34.0)
MCHC: 33 g/dL (ref 32.0–36.0)
MCV: 97.1 fL (ref 80.0–100.0)
Platelets: 207 10*3/uL (ref 150–440)
RBC: 3.63 MIL/uL — AB (ref 4.40–5.90)
RDW: 15.4 % — AB (ref 11.5–14.5)
WBC: 11.8 10*3/uL — AB (ref 3.8–10.6)

## 2016-05-26 LAB — BASIC METABOLIC PANEL
ANION GAP: 9 (ref 5–15)
BUN: 14 mg/dL (ref 6–20)
CALCIUM: 8 mg/dL — AB (ref 8.9–10.3)
CO2: 24 mmol/L (ref 22–32)
Chloride: 108 mmol/L (ref 101–111)
Creatinine, Ser: 0.97 mg/dL (ref 0.61–1.24)
GLUCOSE: 95 mg/dL (ref 65–99)
POTASSIUM: 4.3 mmol/L (ref 3.5–5.1)
Sodium: 141 mmol/L (ref 135–145)

## 2016-05-26 LAB — PROCALCITONIN: PROCALCITONIN: 2.77 ng/mL

## 2016-05-26 LAB — BLOOD GAS, ARTERIAL
Acid-Base Excess: 1.1 mmol/L (ref 0.0–3.0)
Bicarbonate: 26.6 mEq/L (ref 21.0–28.0)
FIO2: 0.4
MECHVT: 500 mL
Mechanical Rate: 18
O2 Saturation: 96.9 %
PEEP: 5 cmH2O
Patient temperature: 37
pCO2 arterial: 45 mmHg (ref 32.0–48.0)
pH, Arterial: 7.38 (ref 7.350–7.450)
pO2, Arterial: 91 mmHg (ref 83.0–108.0)

## 2016-05-26 LAB — PHOSPHORUS
PHOSPHORUS: 4.2 mg/dL (ref 2.5–4.6)
Phosphorus: 2.3 mg/dL — ABNORMAL LOW (ref 2.5–4.6)
Phosphorus: 2.1 mg/dL — ABNORMAL LOW (ref 2.5–4.6)

## 2016-05-26 LAB — MAGNESIUM
MAGNESIUM: 1.8 mg/dL (ref 1.7–2.4)
MAGNESIUM: 1.9 mg/dL (ref 1.7–2.4)
Magnesium: 1.9 mg/dL (ref 1.7–2.4)

## 2016-05-26 LAB — POTASSIUM: POTASSIUM: 4.7 mmol/L (ref 3.5–5.1)

## 2016-05-26 MED ORDER — PRO-STAT SUGAR FREE PO LIQD
30.0000 mL | Freq: Two times a day (BID) | ORAL | Status: DC
  Administered 2016-05-26 – 2016-05-28 (×5): 30 mL

## 2016-05-26 MED ORDER — ATROPINE SULFATE 1 % OP SOLN
1.0000 [drp] | OPHTHALMIC | Status: DC | PRN
  Administered 2016-05-28: 2 [drp] via SUBLINGUAL
  Filled 2016-05-26: qty 2

## 2016-05-26 MED ORDER — VITAL HIGH PROTEIN PO LIQD: 1000.0000 mL | ORAL | Status: DC

## 2016-05-26 MED ORDER — ATROPINE SULFATE 1 % OP SOLN
1.0000 [drp] | OPHTHALMIC | Status: DC | PRN
  Administered 2016-05-26: 2 [drp] via SUBLINGUAL
  Filled 2016-05-26: qty 2

## 2016-05-26 MED ORDER — ACETAMINOPHEN 160 MG/5ML PO SOLN
650.0000 mg | Freq: Four times a day (QID) | ORAL | Status: DC | PRN
  Administered 2016-05-26 – 2016-05-27 (×2): 650 mg via ORAL
  Filled 2016-05-26 (×2): qty 20.3

## 2016-05-26 MED ORDER — ACETAMINOPHEN 650 MG RE SUPP
650.0000 mg | RECTAL | Status: DC | PRN
  Filled 2016-05-26: qty 1

## 2016-05-26 MED ORDER — SODIUM CHLORIDE 0.9 % IV SOLN
3.0000 g | Freq: Four times a day (QID) | INTRAVENOUS | Status: DC
  Administered 2016-05-26 – 2016-05-28 (×9): 3 g via INTRAVENOUS
  Filled 2016-05-26 (×15): qty 3

## 2016-05-26 MED ORDER — VITAL AF 1.2 CAL PO LIQD
1000.0000 mL | ORAL | Status: DC
  Administered 2016-05-26 – 2016-05-28 (×3): 1000 mL

## 2016-05-26 MED ORDER — ANTISEPTIC ORAL RINSE SOLUTION (CORINZ)
7.0000 mL | OROMUCOSAL | Status: DC
  Administered 2016-05-26 – 2016-05-28 (×19): 7 mL via OROMUCOSAL
  Filled 2016-05-26 (×21): qty 7

## 2016-05-26 NOTE — Progress Notes (Signed)
Patient has remained unresponsive to stimuli during shift. Opens eyes at times but involuntarily.  Pupils reactive.  Patient does have a gag reflex.  NSR per cardiac monitor. VSS except patient does have fever with no antipyretic ordered.  This RN gave report to Huntley DecSara, RN who is calling [facility] for tylenol order for fever. Sister visited this afternoon.  Huntley DecSara, RN taking over patient's care at this time.

## 2016-05-26 NOTE — Consult Note (Signed)
Pharmacy Antibiotic Note  Duane Hayes is a 55 y.o. male admitted on 05/27/2016 with aspiration pneumonia.  Pharmacy has been consulted for unasyn dosing.  Plan: unasyn 3g q 6 hours  Height: 5\' 7"  (170.2 cm) Weight: 170 lb 9.5 oz (77.38 kg) IBW/kg (Calculated) : 66.1  Temp (24hrs), Avg:100 F (37.8 C), Min:99 F (37.2 C), Max:101.1 F (38.4 C)   Recent Labs Lab 06/01/2016 0943 05/26/2016 1000 06/23/2016 1338 05/26/16 0355  WBC 18.9*  --   --  11.8*  CREATININE 1.32*  --   --  0.97  LATICACIDVEN  --  6.7* 1.6  --     Estimated Creatinine Clearance: 80.4 mL/min (by C-G formula based on Cr of 0.97).    Not on File  Antimicrobials this admission: unasyn 6/2 >>   Dose adjustments this admission:   Microbiology results: 6/2 BCx: pending 6/1 UCx: no growth   6/1 MRSA PCR: neg  Thank you for allowing pharmacy to be a part of this patient's care.  Olene FlossMelissa D Emery Binz, Pharm.D Clinical Pharmacist  05/26/2016 4:24 PM

## 2016-05-26 NOTE — Progress Notes (Signed)
Initial Nutrition Assessment  DOCUMENTATION CODES:   Not applicable  INTERVENTION:  -Dietitian Consult received for tube feeding; recommend starting Vital AF 1.2 at goal of 60 ml/hr with Prostat BID providing 1928 kcals, 138 g protein, 1166 mL of free water via PepUp Adult Tube Feeding protocol. Continue to assess    NUTRITION DIAGNOSIS:   Inadequate oral intake related to acute illness as evidenced by NPO status.  Being addressed via TF  GOAL:   Provide needs based on ASPEN/SCCM guidelines  MONITOR:   TF tolerance, Vent status, Labs, Weight trends  REASON FOR ASSESSMENT:   Consult, Ventilator Enteral/tube feeding initiation and management  ASSESSMENT:   55  yo male admitted after being found unresponsive, CPR performed, intubated in ED. Pt with cardipoulmonary arrest, AKI. Pt with hx of polysubtance abuse with similar presentation in Jan 2017.   Patient is currently intubated on ventilator support, not on hypothermic protocol MV: 9 L/min Temp (24hrs), Avg:99.4 F (37.4 C), Min:97.2 F (36.2 C), Max:100.4 F (38 C)  Diprivan ordered but not infusing   Past Medical History  Diagnosis Date  . Arthritis     Diet Order:   NPO  Skin:  Reviewed, no issues  Last BM:  no BM documented  Labs: reviewed  Meds: ss novolog, LR with potassium chloride at 75 ml/hr, diprivan  Height:   Ht Readings from Last 1 Encounters:  06/23/2016 5\' 7"  (1.702 m)    Weight: weight trending up since February of this year  Wt Readings from Last 1 Encounters:  05/26/16 170 lb 9.5 oz (77.38 kg)   Wt Readings from Last 10 Encounters:  05/26/16 170 lb 9.5 oz (77.38 kg)  05/27/2016 173 lb 11.6 oz (78.8 kg)  02/07/16 134 lb (60.782 kg)  01/31/16 135 lb 5.8 oz (61.4 kg)  11/09/15 150 lb (68.04 kg)    BMI:  Body mass index is 26.71 kg/(m^2).  Estimated Nutritional Needs:   Kcal:  1913 kcals  Protein:  116-154 g  Fluid:  >/= 1.9 L  EDUCATION NEEDS:   No education needs  identified at this time  Romelle StarcherCate Aanvi Voyles MS, RD, LDN [phone] Pager  [phone] Weekend/On-Call Pager

## 2016-05-26 NOTE — Progress Notes (Signed)
eLink Physician-Brief Progress Note Patient Name: Duane Hayes DOB: 05-30-1961 MRN: [id number]   Date of Service  05/26/2016  HPI/Events of Note  Fever, leukocytosis, increasing infiltrate on CXR.   eICU Interventions  Check cultures, procalcitonin.  Add unasyn.  Prn tylenol for fever.      Intervention Category Major Interventions: Other:  Duane Hayes 05/26/2016, 4:21 PM

## 2016-05-26 NOTE — Progress Notes (Signed)
[facility] [place] [facility] Progess Note    ASSESSMENT/PLAN    DISCUSSION: 55 year old male with known history of polysubstance abuse was found down for unknown period of time, received CPR for 6 minutes now intubated and mechanically ventilated.   ASSESSMENT / PLAN:  PULMONARY A: Acute respiratory failure post cardiopulmonary arrest Review of blood gas today shows a pH 7.38/45/91/26.6, consistent with compensated respiratory failure -view chest x-ray images today 6/2, adequate position of life-support devices, otherwise, unremarkable. Chest P:  We'll continue current vent settings. -The patient's mental status is too suppressed to consider weaning. We'll reassess tomorrow.  CARDIOVASCULAR A:  S/p cardiac arrest P:  Continuous telemetry MAP> than 65   RENAL A:  Acute kidney injury, improved.  Hypokalemia P:  Monitor BMET intermittently Monitor I/Os Correct electrolytes as indicated  GASTROINTESTINAL A:  Mildly elevatedliver enzymes related to alcohol abuse, versus shock, adenopathy. P:  SUP: IV PPI May start tube feeding today. Trend AST, ALT  HEMATOLOGIC A:  No active issues P:  DVT px: SQ heparin Monitor CBC intermittently  INFECTIOUS A:  Leukocytosis - likely stress response P:  Monitor temp, WBC count Micro and abx as above  CULTURES: 6/1 urine culture  ANTIBIOTICS: none   ENDOCRINE A:  Hyperglycemia without prior dx of DM P: Mod scale SSI ordered  NEUROLOGIC A:  Acute anoxicencephalopathy - toxic and/or ischemic, the patient remains minimally responsive, and has received no sedation. -The patient opens eyes to verbal stimuli. There is no purposeful movement. P:  RASS goal: 0 to -1 6/1 CT head negative  EEG completed, 6/2 colon results pending. Propofol ordered if needed  SIGNIFICANT EVENTS: 6/1> post cardiac arrest intubated and on mechanical ventilation.  LINES/TUBES: ETT 6/1    and Best Practices  DVT Prophylaxis: heparin subcutaneous. GI Prophylaxis: famotidine, tube feeds.   ---------------------------------------   ----------------------------------------   Name: Duane Hayes MRN: [id number] DOB: January 30, 1961    ADMISSION DATE:  06/02/2016    SUBJECTIVE:   Pt currently on the ventilator, can not provide history or review of systems.     VITAL SIGNS: Temp:  [95.4 F (35.2 C)-100.2 F (37.9 C)] 100.2 F (37.9 C) (06/02 1000) Pulse Rate:  [65-90] 84 (06/02 1000) Resp:  [16-22] 18 (06/02 1000) BP: (92-129)/(55-73) 109/63 mmHg (06/02 1000) SpO2:  [97 %-100 %] 100 % (06/02 1000) FiO2 (%):  [40 %-50 %] 40 % (06/02 0840) Weight:  [160 lb 0.9 oz (72.6 kg)-170 lb 9.5 oz (77.38 kg)] 170 lb 9.5 oz (77.38 kg) (06/02 0500) HEMODYNAMICS:   VENTILATOR SETTINGS: Vent Mode:  [-] PRVC FiO2 (%):  [40 %-50 %] 40 % Set Rate:  [18 bmp] 18 bmp Vt Set:  [500 mL] 500 mL PEEP:  [5 cmH20] 5 cmH20 Plateau Pressure:  [15 cmH20] 15 cmH20 INTAKE / OUTPUT:  Intake/Output Summary (Last 24 hours) at 05/26/16 1152 Last data filed at 05/26/16 1000  Gross per 24 hour  Intake   3000 ml  Output   1125 ml  Net   1875 ml    PHYSICAL EXAMINATION: Physical Examination:   VS: BP 109/63 mmHg  Pulse 84  Temp(Src) 100.2 F (37.9 C) (Core (Comment))  Resp 18  Ht  (1.702 m)  Wt 170 lb 9.5 oz (77.38 kg)  BMI 26.71 kg/m2  SpO2 100%  General Appearance: No distress  Neuro:without focal findings, the patient opens eyes to verbal stimuli, there is no purposeful response. HEENT: PER; sluggish.  Pulmonary: normal breath sounds  CardiovascularNormal S1,S2.  No m/r/g.   Abdomen: Benign, Soft, non-tender. Renal:  No costovertebral tenderness  GU:  Not performed at this time. Endocrine: No evident thyromegaly. Skin:   warm, no rashes, no ecchymosis  Extremities: normal, no cyanosis, clubbing.   LABS:   LABORATORY PANEL:   CBC  Recent Labs Lab  05/26/16 0355  WBC 11.8*  HGB 11.6*  HCT 35.2*  PLT 207    Chemistries   Recent Labs Lab 06-06-16 0943  05/26/16 0355  NA 139  --  141  K 2.8*  < > 4.3  CL 103  --  108  CO2 19*  --  24  GLUCOSE 354*  --  95  BUN 14  --  14  CREATININE 1.32*  --  0.97  CALCIUM 8.7*  --  8.0*  MG  --   --  1.8  PHOS  --   --  4.2  AST 88*  --   --   ALT 95*  --   --   ALKPHOS 104  --   --   BILITOT 0.3  --   --   < > = values in this interval not displayed.   Recent Labs Lab 2016/06/06 1326 06-Jun-2016 1625 06-Jun-2016 1946 05/26/16 0009 05/26/16 0357 05/26/16 0718  GLUCAP 129* 79 140* 99 104* 103*    Recent Labs Lab 2016/06/06 0925 05/26/16 0515  PHART 7.06* 7.38  PCO2ART 65* 45  PO2ART 179* 91    Recent Labs Lab 06-06-16 0943  AST 88*  ALT 95*  ALKPHOS 104  BILITOT 0.3  ALBUMIN 3.6    Cardiac Enzymes  Recent Labs Lab Jun 06, 2016 0943  TROPONINI <0.03    RADIOLOGY:  Dg Abd 1 View  06/06/2016  CLINICAL DATA:  OG tube placement today. EXAM: ABDOMEN - 1 VIEW COMPARISON:  Single-view of the abdomen 01/21/2016. FINDINGS: OG tube is in place with the tip in the distal stomach. IMPRESSION: As above. Electronically Signed   By: Drusilla Kanner M.D.   On: Jun 06, 2016 16:05   Ct Head Wo Contrast  06-06-16  CLINICAL DATA:  Found unresponsive. CPR for 6 minutes. Possible drug overdose. EXAM: CT HEAD WITHOUT CONTRAST TECHNIQUE: Contiguous axial images were obtained from the base of the skull through the vertex without intravenous contrast. COMPARISON:  CT head without contrast 01/21/2016. FINDINGS: No acute cortical infarct, hemorrhage, or mass lesion is present. The ventricles are of normal size. No significant extra-axial fluid collection is evident. The paranasal sinuses and mastoid air cells are clear. The calvarium is intact. The globes and orbits are intact. No significant extracranial soft tissue lesions are present. IMPRESSION: Negative CT of the head. Electronically Signed    By: Marin Roberts M.D.   On: 06-06-16 11:37   Dg Chest Port 1 View  05/26/2016  CLINICAL DATA:  Respiratory failure. EXAM: PORTABLE CHEST 1 VIEW COMPARISON:  Yesterday at 1610 hour FINDINGS: Endotracheal tube is 4 cm from the carina. Enteric tube in place, tip below the diaphragm not included in the field of view. Cardiomediastinal contours are unchanged. Developing in a left lung base opacity and blunting of the costophrenic angle. Right lung is clear. Minimal vascular congestion without edema. No pneumothorax. IMPRESSION: 1. Developing left lung base opacity and small left pleural effusion. 2. Endotracheal tube unchanged. Enteric tube tip below the diaphragm. Electronically Signed   By: Rubye Oaks M.D.   On: 05/26/2016 06:18   Dg Chest Port 1 View  06/06/16  CLINICAL DATA:  Hypoxia EXAM: PORTABLE CHEST 1 VIEW COMPARISON:  January 31, 2016 FINDINGS: Endotracheal tube tip is 4.2 cm above the carina. No pneumothorax. There are occasional small calcified granulomas. There is no edema or consolidation. Heart size is upper normal with pulmonary vascularity within normal limits. No adenopathy evident. There is degenerative change in each shoulder. IMPRESSION: Endotracheal tube as described without pneumothorax. Scattered small granulomas. No edema or consolidation. Stable cardiac silhouette. Electronically Signed   By: Bretta BangWilliam  Woodruff III M.D.   On: 04/06/2016 10:35       --Wells Guileseep Wally Shevchenko, MD.  ICU Pager: [phone] [facility] Office Number: [phone]  Santiago Gladavid Kasa, M.D.  Stephanie AcreVishal Mungal, M.D.  Billy Fischeravid Simonds, M.D  05/26/2016  Critical Care Attestation.  I have personally obtained a history, examined the patient, evaluated laboratory and imaging results, formulated the assessment and plan and placed orders. The Patient requires high complexity decision making for assessment and support, frequent evaluation and titration of therapies, application of  advanced monitoring technologies and extensive interpretation of multiple databases. The patient has critical illness that could lead imminently to failure of 1 or more organ systems and requires the highest level of physician preparedness to intervene.  Critical Care Time devoted to patient care services described in this note is 35 minutes and is exclusive of time spent in procedures.

## 2016-05-27 ENCOUNTER — Inpatient Hospital Stay: Payer: Medicaid Other

## 2016-05-27 DIAGNOSIS — F1910 Other psychoactive substance abuse, uncomplicated: Secondary | ICD-10-CM

## 2016-05-27 LAB — GLUCOSE, CAPILLARY
GLUCOSE-CAPILLARY: 139 mg/dL — AB (ref 65–99)
GLUCOSE-CAPILLARY: 162 mg/dL — AB (ref 65–99)
GLUCOSE-CAPILLARY: 156 mg/dL — AB (ref 65–99)
GLUCOSE-CAPILLARY: 166 mg/dL — AB (ref 65–99)
Glucose-Capillary: 149 mg/dL — ABNORMAL HIGH (ref 65–99)
Glucose-Capillary: 156 mg/dL — ABNORMAL HIGH (ref 65–99)

## 2016-05-27 LAB — COMPREHENSIVE METABOLIC PANEL
ALBUMIN: 3 g/dL — AB (ref 3.5–5.0)
ALK PHOS: 88 U/L (ref 38–126)
ALT: 67 U/L — ABNORMAL HIGH (ref 17–63)
ANION GAP: 6 (ref 5–15)
AST: 78 U/L — ABNORMAL HIGH (ref 15–41)
BILIRUBIN TOTAL: 0.7 mg/dL (ref 0.3–1.2)
BUN: 15 mg/dL (ref 6–20)
CALCIUM: 8.4 mg/dL — AB (ref 8.9–10.3)
CO2: 27 mmol/L (ref 22–32)
Chloride: 107 mmol/L (ref 101–111)
Creatinine, Ser: 0.94 mg/dL (ref 0.61–1.24)
GFR calc Af Amer: >60 mL/min (ref >60)
GLUCOSE: 158 mg/dL — AB (ref 65–99)
Potassium: 4.3 mmol/L (ref 3.5–5.1)
Sodium: 140 mmol/L (ref 135–145)
TOTAL PROTEIN: 6.1 g/dL — AB (ref 6.5–8.1)

## 2016-05-27 LAB — CBC
HCT: 33.5 % — ABNORMAL LOW (ref 40.0–52.0)
HEMOGLOBIN: 11.4 g/dL — AB (ref 13.0–18.0)
MCH: 32.7 pg (ref 26.0–34.0)
MCHC: 34.2 g/dL (ref 32.0–36.0)
MCV: 95.5 fL (ref 80.0–100.0)
Platelets: 163 10*3/uL (ref 150–440)
RBC: 3.5 MIL/uL — ABNORMAL LOW (ref 4.40–5.90)
RDW: 14.9 % — AB (ref 11.5–14.5)
WBC: 8.6 10*3/uL (ref 3.8–10.6)

## 2016-05-27 LAB — MAGNESIUM
MAGNESIUM: 1.9 mg/dL (ref 1.7–2.4)
Magnesium: 2 mg/dL (ref 1.7–2.4)

## 2016-05-27 LAB — PHOSPHORUS
PHOSPHORUS: 2.5 mg/dL (ref 2.5–4.6)
PHOSPHORUS: 2.5 mg/dL (ref 2.5–4.6)

## 2016-05-27 LAB — PROCALCITONIN: PROCALCITONIN: 1.92 ng/mL

## 2016-05-27 MED ORDER — METOPROLOL TARTRATE 5 MG/5ML IV SOLN
5.0000 mg | INTRAVENOUS | Status: DC | PRN
  Administered 2016-05-27 – 2016-05-28 (×4): 5 mg via INTRAVENOUS
  Filled 2016-05-27 (×4): qty 5

## 2016-05-27 MED ORDER — HYDRALAZINE HCL 20 MG/ML IJ SOLN
10.0000 mg | INTRAMUSCULAR | Status: DC | PRN
  Administered 2016-05-27 – 2016-05-28 (×2): 10 mg via INTRAVENOUS
  Filled 2016-05-27 (×2): qty 1

## 2016-05-27 NOTE — Consult Note (Signed)
Pharmacy Antibiotic Note  Duane Hayes is a 55 y.o. male admitted on 06/19/2016 with aspiration pneumonia.  Pharmacy has been consulted for unasyn dosing.  Plan: unasyn 3g q 6 hours  Height: 5\' 7"  (170.2 cm) Weight: 161 lb 13.1 oz (73.4 kg) IBW/kg (Calculated) : 66.1  Temp (24hrs), Avg:100.3 F (37.9 C), Min:99 F (37.2 C), Max:101.5 F (38.6 C)   Recent Labs Lab 06/04/2016 0943 06/21/2016 1000 06/07/2016 1338 05/26/16 0355 05/27/16 0406  WBC 18.9*  --   --  11.8* 8.6  CREATININE 1.32*  --   --  0.97 0.94  LATICACIDVEN  --  6.7* 1.6  --   --     Estimated Creatinine Clearance: 83 mL/min (by C-G formula based on Cr of 0.94).    Not on File  Antimicrobials this admission: unasyn 6/2 >>   Dose adjustments this admission:   Microbiology results: 6/2 BCx: pending 6/1 UCx: no growth  6/2 Trach cx: GPC, GPR  6/1 MRSA PCR: neg  Thank you for allowing pharmacy to be a part of this patient's care.  Bari MantisKristin Gene Glazebrook PharmD Clinical Pharmacist 05/27/2016   1:41 PM

## 2016-05-27 NOTE — Progress Notes (Addendum)
Called [facility], spoke with Patrick Small, accepted referral. Referral #: [id number]. Call if something changes emergently. CD[facility] will be by later this AM- [facility]

## 2016-05-27 NOTE — Progress Notes (Signed)
Throughout day occasional withdrawal to painful stimuli, no other changes noted, follow-up CT of head obtained, MD's aware, CDS aware, Family meeting tomorrow afternoon.

## 2016-05-27 NOTE — Consult Note (Signed)
CC: altered mental status.   HPI: Duane Hayes is an 55 y.o. male with known history of polysubstance abuse [place] for unknown period of time.Daughter in law, found the patient not breathing well, therefore she called 911 and started CPR.Care Nurse states that patient received 4mg  of narcan and 5 doses of epinephrine prior to arrival , they performed CPR for a total of 6 minutes prior to ROSC. Pt has multiple admissions to ED. Initial CTH no acute abnormalities.    Past Medical History  Diagnosis Date  . Arthritis     Past Surgical History  Procedure Laterality Date  . Shoulder surgery      History reviewed. No pertinent family history.  Social History:  reports that he has been smoking Cigarettes.  He has been smoking about 1.00 pack per day. He does not have any smokeless tobacco history on file. He reports that he drinks alcohol. He reports that he uses illicit drugs (Cocaine, Benzodiazepines, and MDMA (Ecstacy)).  Not on File  Medications: I have reviewed the patient's current medications.  ROS: Not able to obtain   Physical Examination: Blood pressure 154/82, pulse 94, temperature 99.7 F (37.6 C), temperature source Core (Comment), resp. rate 18, height 5\' 7"  (1.702 m), weight 73.4 kg (161 lb 13.1 oz), SpO2 94 %.  Pt is intubated, not following commands Has sluggish pupils  Occasional withdrawal from painful stimuli.    Laboratory Studies:   Basic Metabolic Panel:  Recent Labs Lab 06-16-2016 0943 05/26/16 0129 05/26/16 0355 05/26/16 1326 05/26/16 1708 05/27/16 0406  NA 139  --  141  --   --  140  K 2.8* 4.7 4.3  --   --  4.3  CL 103  --  108  --   --  107  CO2 19*  --  24  --   --  27  GLUCOSE 354*  --  95  --   --  158*  BUN 14  --  14  --   --  15  CREATININE 1.32*  --  0.97  --   --  0.94  CALCIUM 8.7*  --  8.0*  --   --  8.4*  MG  --   --  1.8 1.9 1.9 1.9  PHOS  --   --  4.2 2.3* 2.1* 2.5    Liver  Function Tests:  Recent Labs Lab 06-16-16 0943 05/27/16 0406  AST 88* 78*  ALT 95* 67*  ALKPHOS 104 88  BILITOT 0.3 0.7  PROT 6.4* 6.1*  ALBUMIN 3.6 3.0*   No results for input(s): LIPASE, AMYLASE in the last 168 hours. No results for input(s): AMMONIA in the last 168 hours.  CBC:  Recent Labs Lab 16-Jun-2016 0943 05/26/16 0355 05/27/16 0406  WBC 18.9* 11.8* 8.6  NEUTROABS 7.4*  --   --   HGB 12.7* 11.6* 11.4*  HCT 39.6* 35.2* 33.5*  MCV 100.3* 97.1 95.5  PLT 286 207 163    Cardiac Enzymes:  Recent Labs Lab June 16, 2016 0943  TROPONINI <0.03    BNP: Invalid input(s): POCBNP  CBG:  Recent Labs Lab 05/26/16 1951 05/26/16 2351 05/27/16 0413 05/27/16 0725 05/27/16 1118  GLUCAP 114* 119* 139* 149* 162*    Microbiology: Results for orders placed or performed during the hospital encounter of 2016-06-16  Urine culture     Status: None   Collection Time: June 16, 2016  9:43 AM  Result Value Ref Range  Status   Specimen Description URINE, RANDOM  Final   Special Requests NONE  Final   Culture NO GROWTH Performed at [facility]   Final   Report Status 05/26/2016 FINAL  Final  MRSA PCR Screening     Status: None   Collection Time: 2016-06-19  2:27 PM  Result Value Ref Range Status   MRSA by PCR NEGATIVE NEGATIVE Final    Comment:        The GeneXpert MRSA Assay (FDA approved for NASAL specimens only), is one component of a comprehensive MRSA colonization surveillance program. It is not intended to diagnose MRSA infection nor to guide or monitor treatment for MRSA infections.   Culture, blood (Routine X 2) w Reflex to ID Panel     Status: None (Preliminary result)   Collection Time: 05/26/16  5:07 PM  Result Value Ref Range Status   Specimen Description BLOOD RIGHT ANTECUBITAL  Final   Special Requests BOTTLES DRAWN AEROBIC AND ANAEROBIC  5CC  Final   Culture NO GROWTH < 24 HOURS  Final   Report Status PENDING  Incomplete  Culture, blood (Routine X 2)  w Reflex to ID Panel     Status: None (Preliminary result)   Collection Time: 05/26/16  5:08 PM  Result Value Ref Range Status   Specimen Description BLOOD LEFT ANTECUBITAL  Final   Special Requests BOTTLES DRAWN AEROBIC AND ANAEROBIC  5CC  Final   Culture NO GROWTH < 24 HOURS  Final   Report Status PENDING  Incomplete  Culture, respiratory (NON-Expectorated)     Status: None (Preliminary result)   Collection Time: 05/26/16  5:21 PM  Result Value Ref Range Status   Specimen Description ENDOTRACHEAL  Final   Special Requests NONE  Final   Gram Stain   Final    MANY WBC SEEN MANY GRAM POSITIVE COCCI RARE GRAM POSITIVE RODS    Culture HOLDING FOR POSSIBLE PATHOGEN  Final   Report Status PENDING  Incomplete    Coagulation Studies:  Recent Labs  06-19-2016 0943  LABPROT 15.7*  INR 1.23    Urinalysis:  Recent Labs Lab 19-Jun-2016 0943  COLORURINE YELLOW*  LABSPEC 1.010  PHURINE 6.0  GLUCOSEU >500*  HGBUR 1+*  BILIRUBINUR NEGATIVE  KETONESUR NEGATIVE  PROTEINUR 100*  NITRITE NEGATIVE  LEUKOCYTESUR NEGATIVE    Lipid Panel:     Component Value Date/Time   TRIG 158* 06-19-2016 1522    HgbA1C:  Lab Results  Component Value Date   HGBA1C 5.6 01/21/2016    Urine Drug Screen:     Component Value Date/Time   LABOPIA NONE DETECTED 19-Jun-2016 0943   COCAINSCRNUR NONE DETECTED 06-19-16 0943   LABBENZ POSITIVE* 06/19/2016 0943   AMPHETMU NONE DETECTED June 19, 2016 0943   THCU POSITIVE* 06/19/2016 0943   LABBARB NONE DETECTED 2016/06/19 0943    Alcohol Level:  Recent Labs Lab 2016-06-19 0943  ETH <5    Other results: EKG: normal EKG, normal sinus rhythm, unchanged from previous tracings.  Imaging: Dg Chest 1 View  05/27/2016  CLINICAL DATA:  Dyspnea. EXAM: CHEST 1 VIEW COMPARISON:  05/26/2016 FINDINGS: Endotracheal tube remains in place and terminates approximately 4.5 cm above the carina. Enteric tube courses into the left upper abdomen with tip not imaged.  Cardiomediastinal silhouette is within normal limits. Left basilar opacity is unchanged with persistent blunting of the left costophrenic angle suggestive of a small effusion. There is also new, mild opacity in the medial right lung base. No pneumothorax. Calcified  granulomas. IMPRESSION: Unchanged left basilar and new right basilar opacities which may reflect atelectasis or infection. Persistent small left pleural effusion. Electronically Signed   By: Sebastian AcheAllen  Grady M.D.   On: 05/27/2016 07:25   Dg Abd 1 View  06/14/2016  CLINICAL DATA:  OG tube placement today. EXAM: ABDOMEN - 1 VIEW COMPARISON:  Single-view of the abdomen 01/21/2016. FINDINGS: OG tube is in place with the tip in the distal stomach. IMPRESSION: As above. Electronically Signed   By: Drusilla Kannerhomas  Dalessio M.D.   On: Jan 04, 2016 16:05   Dg Chest Port 1 View  05/26/2016  CLINICAL DATA:  Respiratory failure. EXAM: PORTABLE CHEST 1 VIEW COMPARISON:  Yesterday at [id number] hour FINDINGS: Endotracheal tube is 4 cm from the carina. Enteric tube in place, tip below the diaphragm not included in the field of view. Cardiomediastinal contours are unchanged. Developing in a left lung base opacity and blunting of the costophrenic angle. Right lung is clear. Minimal vascular congestion without edema. No pneumothorax. IMPRESSION: 1. Developing left lung base opacity and small left pleural effusion. 2. Endotracheal tube unchanged. Enteric tube tip below the diaphragm. Electronically Signed   By: Rubye OaksMelanie  Ehinger M.D.   On: 05/26/2016 06:18     Assessment/Plan: 55 y.o. male with known history of polysubstance abuse [place] for unknown period of time.Daughter in law, found the patient not breathing well, therefore she called 911 and started CPR.Care Nurse states that patient received 4mg  of narcan and 5 doses of epinephrine prior to arrival , they performed CPR for a total of 6 minutes prior to ROSC. Pt has multiple admissions to  ED. Initial CTH no acute abnormalities.    - ? Anoxia involved Initial CTH no acute abnormalities Attempted to obtain MRI brain but still unable to obtain in intubated pts  CTH when possible to look for possible ischemia Possible EEG on Monday Guarded prognosis. Pauletta BrownsZEYLIKMAN, Amirr Achord   05/27/2016, 2:15 PM

## 2016-05-27 NOTE — Progress Notes (Signed)
[facility] Progess Note    ASSESSMENT/PLAN    DISCUSSION: 55 year old male with known history of polysubstance abuse was found down for unknown period of time, received CPR for 6 minutes now intubated and mechanically ventilated.   ASSESSMENT / PLAN:  PULMONARY A: Acute respiratory failure post cardiopulmonary arrest -view chest x-ray images today 6/3, adequate position of life-support devices, new right middle lobe infiltrate, in combination with fever, likely consistent with right middle lobe pneumonia. P:  We'll continue current vent settings. -The patient's mental status continues to be 2 suppressed to consider weaning. We'll reassess tomorrow.  CARDIOVASCULAR A:  S/p cardiac arrest P:  Continuous telemetry MAP> than 65   RENAL A:  Acute kidney injury, improved.  Hypokalemia-resolved P:  Monitor BMET intermittently Monitor I/Os Correct electrolytes as indicated  GASTROINTESTINAL A:  Mildly elevated liver enzymes related to alcohol abuse, versus shock, adenopathy. P:  SUP: IV PPI May start tube feeding today.   HEMATOLOGIC A:  No active issues P:  DVT px: SQ heparin Monitor CBC intermittently  INFECTIOUS A:  Leukocytosis - likely stress response P:  Monitor temp, WBC count Micro and abx as above  CULTURES: 6/1 MRSA screen: Negative 6/1 urine culture 6/2. Blood cultures: Pending. 6/2. Sputum culture: Pending  Pro calcitonin: 6/2 2.77>>6\3:1.92.  ANTIBIOTICS: Unasyn 6/2>>   ENDOCRINE A:  Hyperglycemia, better controlled with sliding scale insulin. P: Mod scale SSI ordered  NEUROLOGIC A:  Acute anoxicencephalopathy - toxic and/or ischemic, the patient remains minimally responsive, and has received no sedation. -The patient opens eyes to physical stimuli. There is no purposeful movement. P:  RASS goal: 0 to -1 6/1 CT head negative  EEG completed, 6/2 colon results  pending. Propofol ordered if needed  SIGNIFICANT EVENTS: 6/1> post cardiac arrest intubated and on mechanical ventilation.  LINES/TUBES: ETT 6/1   and Best Practices  DVT Prophylaxis: heparin subcutaneous. GI Prophylaxis: famotidine, tube feeds.   ---------------------------------------   ----------------------------------------   Name: Duane Hayes MRN: [id number] DOB: 1961-04-14    ADMISSION DATE:  06/19/2016    SUBJECTIVE:   Pt currently on the ventilator, can not provide history or review of systems.     VITAL SIGNS: Temp:  [99 F (37.2 C)-101.5 F (38.6 C)] 99.1 F (37.3 C) (06/03 0900) Pulse Rate:  [49-121] 72 (06/03 0900) Resp:  [18-19] 18 (06/03 0900) BP: (94-140)/(57-76) 128/62 mmHg (06/03 0900) SpO2:  [92 %-99 %] 99 % (06/03 0900) FiO2 (%):  [28 %-40 %] 28 % (06/03 0751) Weight:  [161 lb 13.1 oz (73.4 kg)] 161 lb 13.1 oz (73.4 kg) (06/03 0412) HEMODYNAMICS:   VENTILATOR SETTINGS: Vent Mode:  [-] PRVC FiO2 (%):  [28 %-40 %] 28 % Set Rate:  [18 bmp] 18 bmp Vt Set:  [500 mL] 500 mL PEEP:  [5 cmH20] 5 cmH20 INTAKE / OUTPUT:  Intake/Output Summary (Last 24 hours) at 05/27/16 1033 Last data filed at 05/27/16 0900  Gross per 24 hour  Intake   3315 ml  Output   1470 ml  Net   1845 ml    PHYSICAL EXAMINATION: Physical Examination:   VS: BP 128/62 mmHg  Pulse 72  Temp(Src) 99.1 F (37.3 C) (Core (Comment))  Resp 18  Ht 5\' 7"  (1.702 m)  Wt 161 lb 13.1 oz (73.4 kg)  BMI 25.34 kg/m2  SpO2 99%  General Appearance: No distress  Neuro:without focal findings, the patient opens eyes to Physical stimuli, there is no purposeful response, negative oculocephalic  reflex. HEENT: PER; sluggish.  Pulmonary: normal breath sounds   CardiovascularNormal S1,S2.  No m/r/g.   Abdomen: Benign, Soft, non-tender. Renal:  No costovertebral tenderness  GU:  Not performed at this time. Endocrine: No evident thyromegaly. Skin:   warm, no rashes, no ecchymosis   Extremities: normal, no cyanosis, clubbing.   LABS:   LABORATORY PANEL:   CBC  Recent Labs Lab 05/27/16 0406  WBC 8.6  HGB 11.4*  HCT 33.5*  PLT 163    Chemistries   Recent Labs Lab 05/27/16 0406  NA 140  K 4.3  CL 107  CO2 27  GLUCOSE 158*  BUN 15  CREATININE 0.94  CALCIUM 8.4*  MG 1.9  PHOS 2.5  AST 78*  ALT 67*  ALKPHOS 88  BILITOT 0.7     Recent Labs Lab 05/26/16 1132 05/26/16 1532 05/26/16 1951 05/26/16 2351 05/27/16 0413 05/27/16 0725  GLUCAP 102* 77 114* 119* 139* 149*    Recent Labs Lab January 28, 2016 0925 05/26/16 0515  PHART 7.06* 7.38  PCO2ART 65* 45  PO2ART 179* 91    Recent Labs Lab January 28, 2016 0943 05/27/16 0406  AST 88* 78*  ALT 95* 67*  ALKPHOS 104 88  BILITOT 0.3 0.7  ALBUMIN 3.6 3.0*    Cardiac Enzymes  Recent Labs Lab January 28, 2016 0943  TROPONINI <0.03    RADIOLOGY:  Dg Chest 1 View  05/27/2016  CLINICAL DATA:  Dyspnea. EXAM: CHEST 1 VIEW COMPARISON:  05/26/2016 FINDINGS: Endotracheal tube remains in place and terminates approximately 4.5 cm above the carina. Enteric tube courses into the left upper abdomen with tip not imaged. Cardiomediastinal silhouette is within normal limits. Left basilar opacity is unchanged with persistent blunting of the left costophrenic angle suggestive of a small effusion. There is also new, mild opacity in the medial right lung base. No pneumothorax. Calcified granulomas. IMPRESSION: Unchanged left basilar and new right basilar opacities which may reflect atelectasis or infection. Persistent small left pleural effusion. Electronically Signed   By: Sebastian AcheAllen  Grady M.D.   On: 05/27/2016 07:25   Dg Abd 1 View  05/27/2016  CLINICAL DATA:  OG tube placement today. EXAM: ABDOMEN - 1 VIEW COMPARISON:  Single-view of the abdomen 01/21/2016. FINDINGS: OG tube is in place with the tip in the distal stomach. IMPRESSION: As above. Electronically Signed   By: Drusilla Kannerhomas  Dalessio M.D.   On: April 02, 2016 16:05   Ct  Head Wo Contrast  06/21/2016  CLINICAL DATA:  Found unresponsive. CPR for 6 minutes. Possible drug overdose. EXAM: CT HEAD WITHOUT CONTRAST TECHNIQUE: Contiguous axial images were obtained from the base of the skull through the vertex without intravenous contrast. COMPARISON:  CT head without contrast 01/21/2016. FINDINGS: No acute cortical infarct, hemorrhage, or mass lesion is present. The ventricles are of normal size. No significant extra-axial fluid collection is evident. The paranasal sinuses and mastoid air cells are clear. The calvarium is intact. The globes and orbits are intact. No significant extracranial soft tissue lesions are present. IMPRESSION: Negative CT of the head. Electronically Signed   By: Marin Robertshristopher  Mattern M.D.   On: April 02, 2016 11:37   Dg Chest Port 1 View  05/26/2016  CLINICAL DATA:  Respiratory failure. EXAM: PORTABLE CHEST 1 VIEW COMPARISON:  Yesterday at [id number] hour FINDINGS: Endotracheal tube is 4 cm from the carina. Enteric tube in place, tip below the diaphragm not included in the field of view. Cardiomediastinal contours are unchanged. Developing in a left lung base opacity and blunting of the costophrenic angle. Right  lung is clear. Minimal vascular congestion without edema. No pneumothorax. IMPRESSION: 1. Developing left lung base opacity and small left pleural effusion. 2. Endotracheal tube unchanged. Enteric tube tip below the diaphragm. Electronically Signed   By: Rubye Oaks M.D.   On: 05/26/2016 06:18       --Wells Guiles, MD.  ICU Pager: [phone] [facility] Office Number: [phone]  Santiago Glad, M.D.  Stephanie Acre, M.D.  Billy Fischer, M.D  05/27/2016  Critical Care Attestation.  I have personally obtained a history, examined the patient, evaluated laboratory and imaging results, formulated the assessment and plan and placed orders. The Patient requires high complexity decision making for assessment and support,  frequent evaluation and titration of therapies, application of advanced monitoring technologies and extensive interpretation of multiple databases. The patient has critical illness that could lead imminently to failure of 1 or more organ systems and requires the highest level of physician preparedness to intervene.  Critical Care Time devoted to patient care services described in this note is 35 minutes and is exclusive of time spent in procedures.

## 2016-05-28 ENCOUNTER — Inpatient Hospital Stay: Payer: Medicaid Other

## 2016-05-28 LAB — URINALYSIS COMPLETE WITH MICROSCOPIC (ARMC ONLY)
BILIRUBIN URINE: NEGATIVE
Bacteria, UA: NONE SEEN
HGB URINE DIPSTICK: NEGATIVE
LEUKOCYTES UA: NEGATIVE
NITRITE: NEGATIVE
Protein, ur: 30 mg/dL — AB
Specific Gravity, Urine: 1.011 (ref 1.005–1.030)
Squamous Epithelial / LPF: NONE SEEN
pH: 8 (ref 5.0–8.0)

## 2016-05-28 LAB — EXPECTORATED SPUTUM ASSESSMENT W GRAM STAIN, RFLX TO RESP C

## 2016-05-28 LAB — HEPATIC FUNCTION PANEL
ALT: 73 U/L — AB (ref 17–63)
AST: 113 U/L — AB (ref 15–41)
Albumin: 3.2 g/dL — ABNORMAL LOW (ref 3.5–5.0)
Alkaline Phosphatase: 113 U/L (ref 38–126)
BILIRUBIN DIRECT: 0.2 mg/dL (ref 0.1–0.5)
BILIRUBIN INDIRECT: 0.5 mg/dL (ref 0.3–0.9)
BILIRUBIN TOTAL: 0.7 mg/dL (ref 0.3–1.2)
Total Protein: 7 g/dL (ref 6.5–8.1)

## 2016-05-28 LAB — CBC
HCT: 36.6 % — ABNORMAL LOW (ref 40.0–52.0)
HEMOGLOBIN: 12.5 g/dL — AB (ref 13.0–18.0)
MCH: 32.2 pg (ref 26.0–34.0)
MCHC: 34.1 g/dL (ref 32.0–36.0)
MCV: 94.5 fL (ref 80.0–100.0)
Platelets: 198 10*3/uL (ref 150–440)
RBC: 3.87 MIL/uL — ABNORMAL LOW (ref 4.40–5.90)
RDW: 14.8 % — AB (ref 11.5–14.5)
WBC: 10 10*3/uL (ref 3.8–10.6)

## 2016-05-28 LAB — BASIC METABOLIC PANEL
Anion gap: 10 (ref 5–15)
BUN: 14 mg/dL (ref 6–20)
CALCIUM: 8.8 mg/dL — AB (ref 8.9–10.3)
CHLORIDE: 101 mmol/L (ref 101–111)
CO2: 25 mmol/L (ref 22–32)
CREATININE: 0.63 mg/dL (ref 0.61–1.24)
GFR calc Af Amer: >60 mL/min (ref >60)
GFR calc non Af Amer: >60 mL/min (ref >60)
Glucose, Bld: 166 mg/dL — ABNORMAL HIGH (ref 65–99)
Potassium: 3.8 mmol/L (ref 3.5–5.1)
SODIUM: 136 mmol/L (ref 135–145)

## 2016-05-28 LAB — PROCALCITONIN: PROCALCITONIN: 0.95 ng/mL

## 2016-05-28 LAB — GLUCOSE, CAPILLARY
GLUCOSE-CAPILLARY: 165 mg/dL — AB (ref 65–99)
GLUCOSE-CAPILLARY: 170 mg/dL — AB (ref 65–99)
Glucose-Capillary: 216 mg/dL — ABNORMAL HIGH (ref 65–99)
Glucose-Capillary: 227 mg/dL — ABNORMAL HIGH (ref 65–99)

## 2016-05-28 MED ORDER — MORPHINE SULFATE (PF) 4 MG/ML IV SOLN
4.0000 mg | Freq: Once | INTRAVENOUS | Status: AC
  Administered 2016-05-28: 4 mg via INTRAVENOUS

## 2016-05-28 MED ORDER — GLYCOPYRROLATE 0.2 MG/ML IJ SOLN
0.2000 mg | INTRAMUSCULAR | Status: DC | PRN
  Administered 2016-05-28: 0.2 mg via INTRAVENOUS
  Filled 2016-05-28: qty 1

## 2016-05-28 MED ORDER — DESMOPRESSIN ACETATE 4 MCG/ML IJ SOLN
0.5000 ug | Freq: Once | INTRAMUSCULAR | Status: AC
  Administered 2016-05-28: 0.52 ug via SUBCUTANEOUS
  Filled 2016-05-28: qty 0.13

## 2016-05-28 MED ORDER — LORAZEPAM 2 MG/ML IJ SOLN
2.0000 mg | INTRAMUSCULAR | Status: DC | PRN
  Administered 2016-05-28: 2 mg via INTRAVENOUS
  Filled 2016-05-28: qty 1

## 2016-05-28 MED ORDER — HYDRALAZINE HCL 20 MG/ML IJ SOLN
10.0000 mg | INTRAMUSCULAR | Status: DC | PRN
  Administered 2016-05-28 (×2): 30 mg via INTRAVENOUS
  Filled 2016-05-28 (×2): qty 2

## 2016-05-28 MED ORDER — MIDAZOLAM HCL 2 MG/2ML IJ SOLN
2.0000 mg | INTRAMUSCULAR | Status: DC | PRN
  Administered 2016-05-28: 2 mg via INTRAVENOUS
  Filled 2016-05-28: qty 2

## 2016-05-28 MED ORDER — MORPHINE SULFATE (PF) 2 MG/ML IV SOLN
2.0000 mg | Freq: Once | INTRAVENOUS | Status: AC
  Administered 2016-05-28: 2 mg via INTRAVENOUS

## 2016-05-28 MED ORDER — FENTANYL CITRATE (PF) 100 MCG/2ML IJ SOLN
50.0000 ug | Freq: Once | INTRAMUSCULAR | Status: AC
  Administered 2016-05-28: 50 ug via INTRAVENOUS
  Filled 2016-05-28: qty 2

## 2016-05-28 MED ORDER — LABETALOL HCL 5 MG/ML IV SOLN
20.0000 mg | INTRAVENOUS | Status: DC | PRN
  Administered 2016-05-28 (×3): 20 mg via INTRAVENOUS
  Filled 2016-05-28 (×3): qty 4

## 2016-05-28 MED ORDER — POLYVINYL ALCOHOL 1.4 % OP SOLN
1.0000 [drp] | OPHTHALMIC | Status: DC | PRN
  Administered 2016-05-28: 1 [drp] via OPHTHALMIC
  Filled 2016-05-28: qty 15

## 2016-05-28 MED ORDER — LACTATED RINGERS IV BOLUS (SEPSIS)
600.0000 mL | Freq: Once | INTRAVENOUS | Status: AC
  Administered 2016-05-28: 600 mL via INTRAVENOUS

## 2016-05-28 MED ORDER — CLONIDINE HCL 0.2 MG/24HR TD PTWK: 0.2000 mg | MEDICATED_PATCH | TRANSDERMAL | Status: DC

## 2016-05-28 MED ORDER — MORPHINE SULFATE (PF) 2 MG/ML IV SOLN
2.0000 mg | INTRAVENOUS | Status: DC | PRN
  Administered 2016-05-28 (×3): 2 mg via INTRAVENOUS
  Filled 2016-05-28 (×3): qty 1

## 2016-05-28 MED ORDER — DESMOPRESSIN ACETATE 4 MCG/ML IJ SOLN
1.0000 ug | Freq: Once | INTRAMUSCULAR | Status: AC
  Administered 2016-05-28: 1 ug via INTRAVENOUS
  Filled 2016-05-28: qty 0.25

## 2016-05-28 MED ORDER — SODIUM CHLORIDE 0.9 % IV SOLN
5.0000 mg/h | INTRAVENOUS | Status: DC
  Administered 2016-05-28: 5 mg/h via INTRAVENOUS
  Filled 2016-05-28: qty 10

## 2016-05-28 MED ORDER — CLONIDINE HCL 0.1 MG/24HR TD PTWK
0.1000 mg | MEDICATED_PATCH | TRANSDERMAL | Status: DC
  Administered 2016-05-28: 0.1 mg via TRANSDERMAL
  Filled 2016-05-28: qty 1

## 2016-05-28 MED ORDER — MORPHINE SULFATE (PF) 2 MG/ML IV SOLN
2.0000 mg | INTRAVENOUS | Status: DC | PRN
  Administered 2016-05-28: 2 mg via INTRAVENOUS
  Filled 2016-05-28 (×2): qty 1
  Filled 2016-05-28: qty 2

## 2016-05-29 LAB — GLUCOSE, CAPILLARY: Glucose-Capillary: 105 mg/dL — ABNORMAL HIGH (ref 65–99)

## 2016-05-31 LAB — CULTURE, RESPIRATORY W GRAM STAIN

## 2016-05-31 LAB — CULTURE, RESPIRATORY

## 2016-06-01 LAB — CULTURE, BLOOD (ROUTINE X 2)
CULTURE: NO GROWTH
Culture: NO GROWTH

## 2016-06-24 NOTE — Consult Note (Signed)
Pharmacy Antibiotic Note  Duane Hayes is a 55 y.o. male admitted on 06/18/2016 with aspiration pneumonia.  Pharmacy has been consulted for unasyn dosing.  Plan: unasyn 3g q 6 hours  Height: 5\' 7"  (170.2 cm) Weight: 161 lb 9.6 oz (73.3 kg) IBW/kg (Calculated) : 66.1  Temp (24hrs), Avg:98.9 F (37.2 C), Min:96.8 F (36 C), Max:101.3 F (38.5 C)   Recent Labs Lab 06/15/2016 0943 06/20/2016 1000 06/07/2016 1338 05/26/16 0355 05/27/16 0406 07/22/2016 0443  WBC 18.9*  --   --  11.8* 8.6 10.0  CREATININE 1.32*  --   --  0.97 0.94 0.63  LATICACIDVEN  --  6.7* 1.6  --   --   --     Estimated Creatinine Clearance: 97.5 mL/min (by C-G formula based on Cr of 0.63).    Not on File  Antimicrobials this admission: unasyn 6/2 >>   Dose adjustments this admission:   Microbiology results: 6/2 BCx: NGTD 6/1 UCx: no growth  6/2 Trach cx: GPC-lt.staph aureus, GPR  6/1 MRSA PCR: neg  Thank you for allowing pharmacy to be a part of this patient's care.  Bari MantisKristin Raeann Offner PharmD Clinical Pharmacist 06/16/2016   2:36 PM

## 2016-06-24 NOTE — Progress Notes (Signed)
CDS rep spoke with pts sister who conveyed that sons are not interested in organ donation. Will proceed with withdrawl

## 2016-06-24 NOTE — Consult Note (Signed)
CC: altered mental status.   HPI: Duane Hayes is an 55 y.o. male with known history of polysubstance abuse [place] for unknown period of time.Daughter in law, found the patient not breathing well, therefore she called 911 and started CPR.Care Nurse states that patient received 4mg  of narcan and 5 doses of epinephrine prior to arrival , they performed CPR for a total of 6 minutes prior to ROSC. Pt has multiple admissions to ED. Initial CTH no acute abnormalities.    Past Medical History  Diagnosis Date  . Arthritis     Past Surgical History  Procedure Laterality Date  . Shoulder surgery      History reviewed. No pertinent family history.  Social History:  reports that he has been smoking Cigarettes.  He has been smoking about 1.00 pack per day. He does not have any smokeless tobacco history on file. He reports that he drinks alcohol. He reports that he uses illicit drugs (Cocaine, Benzodiazepines, and MDMA (Ecstacy)).  Not on File  Medications: I have reviewed the patient's current medications.  ROS: Not able to obtain   Physical Examination: Blood pressure 192/113, pulse 91, temperature 99.3 F (37.4 C), temperature source Core (Comment), resp. rate 21, height 5\' 7"  (1.702 m), weight 73.3 kg (161 lb 9.6 oz), SpO2 91 %.  anisocoria with fixed b/l pupils.  No corneal's Not following commands.  Overbreathing the vent.    Laboratory Studies:   Basic Metabolic Panel:  Recent Labs Lab 06/03/2016 0943 05/26/16 0129 05/26/16 0355 05/26/16 1326 05/26/16 1708 05/27/16 0406 05/27/16 1653 June 04, 2016 0443  NA 139  --  141  --   --  140  --  136  K 2.8* 4.7 4.3  --   --  4.3  --  3.8  CL 103  --  108  --   --  107  --  101  CO2 19*  --  24  --   --  27  --  25  GLUCOSE 354*  --  95  --   --  158*  --  166*  BUN 14  --  14  --   --  15  --  14  CREATININE 1.32*  --  0.97  --   --  0.94  --  0.63  CALCIUM 8.7*  --  8.0*  --   --   8.4*  --  8.8*  MG  --   --  1.8 1.9 1.9 1.9 2.0  --   PHOS  --   --  4.2 2.3* 2.1* 2.5 2.5  --     Liver Function Tests:  Recent Labs Lab 06/11/2016 0943 05/27/16 0406 2016-06-04 0443  AST 88* 78* 113*  ALT 95* 67* 73*  ALKPHOS 104 88 113  BILITOT 0.3 0.7 0.7  PROT 6.4* 6.1* 7.0  ALBUMIN 3.6 3.0* 3.2*   No results for input(s): LIPASE, AMYLASE in the last 168 hours. No results for input(s): AMMONIA in the last 168 hours.  CBC:  Recent Labs Lab 06/03/2016 0943 05/26/16 0355 05/27/16 0406 06-04-16 0443  WBC 18.9* 11.8* 8.6 10.0  NEUTROABS 7.4*  --   --   --   HGB 12.7* 11.6* 11.4* 12.5*  HCT 39.6* 35.2* 33.5* 36.6*  MCV 100.3* 97.1 95.5 94.5  PLT 286 207 163 198    Cardiac Enzymes:  Recent Labs Lab 06/08/2016 0943  TROPONINI <0.03    BNP: Invalid input(s):  POCBNP  CBG:  Recent Labs Lab 05/27/16 1945 05/27/16 2327 06/09/2016 0336 06/11/2016 0710 06/03/2016 1141  GLUCAP 166* 156* 165* 170* 216*    Microbiology: Results for orders placed or performed during the hospital encounter of 2016-06-07  Urine culture     Status: None   Collection Time: Jun 07, 2016  9:43 AM  Result Value Ref Range Status   Specimen Description URINE, RANDOM  Final   Special Requests NONE  Final   Culture NO GROWTH Performed at [facility]   Final   Report Status 05/26/2016 FINAL  Final  MRSA PCR Screening     Status: None   Collection Time: 2016-06-07  2:27 PM  Result Value Ref Range Status   MRSA by PCR NEGATIVE NEGATIVE Final    Comment:        The GeneXpert MRSA Assay (FDA approved for NASAL specimens only), is one component of a comprehensive MRSA colonization surveillance program. It is not intended to diagnose MRSA infection nor to guide or monitor treatment for MRSA infections.   Culture, blood (Routine X 2) w Reflex to ID Panel     Status: None (Preliminary result)   Collection Time: 05/26/16  5:07 PM  Result Value Ref Range Status   Specimen Description BLOOD  RIGHT ANTECUBITAL  Final   Special Requests BOTTLES DRAWN AEROBIC AND ANAEROBIC  5CC  Final   Culture NO GROWTH 2 DAYS  Final   Report Status PENDING  Incomplete  Culture, blood (Routine X 2) w Reflex to ID Panel     Status: None (Preliminary result)   Collection Time: 05/26/16  5:08 PM  Result Value Ref Range Status   Specimen Description BLOOD LEFT ANTECUBITAL  Final   Special Requests BOTTLES DRAWN AEROBIC AND ANAEROBIC  5CC  Final   Culture NO GROWTH 2 DAYS  Final   Report Status PENDING  Incomplete  Culture, respiratory (NON-Expectorated)     Status: None (Preliminary result)   Collection Time: 05/26/16  5:21 PM  Result Value Ref Range Status   Specimen Description ENDOTRACHEAL  Final   Special Requests NONE  Final   Gram Stain   Final    MANY WBC SEEN MANY GRAM POSITIVE COCCI RARE GRAM POSITIVE RODS    Culture   Final    LIGHT GROWTH STAPHYLOCOCCUS AUREUS SUSCEPTIBILITIES TO FOLLOW    Report Status PENDING  Incomplete  Culture, expectorated sputum-assessment     Status: None   Collection Time: 06/22/2016  7:53 AM  Result Value Ref Range Status   Specimen Description TRACHEAL ASPIRATE  Final   Special Requests NONE  Final   Sputum evaluation THIS SPECIMEN IS ACCEPTABLE FOR SPUTUM CULTURE  Final   Report Status 06/23/2016 FINAL  Final    Coagulation Studies: No results for input(s): LABPROT, INR in the last 72 hours.  Urinalysis:   Recent Labs Lab 06-07-2016 0943 06/18/2016 0840  COLORURINE YELLOW* STRAW*  LABSPEC 1.010 1.011  PHURINE 6.0 8.0  GLUCOSEU >500* >500*  HGBUR 1+* NEGATIVE  BILIRUBINUR NEGATIVE NEGATIVE  KETONESUR NEGATIVE 1+*  PROTEINUR 100* 30*  NITRITE NEGATIVE NEGATIVE  LEUKOCYTESUR NEGATIVE NEGATIVE    Lipid Panel:     Component Value Date/Time   TRIG 158* 07-Jun-2016 1522    HgbA1C:  Lab Results  Component Value Date   HGBA1C 5.6 01/21/2016    Urine Drug Screen:      Component Value Date/Time   LABOPIA NONE DETECTED 2016/06/07 0943    COCAINSCRNUR NONE DETECTED 06/07/16 0943  LABBENZ POSITIVE* 06/12/2016 0943   AMPHETMU NONE DETECTED 05/30/2016 0943   THCU POSITIVE* 06/16/2016 0943   LABBARB NONE DETECTED 06/16/2016 0943    Alcohol Level:   Recent Labs Lab 06/23/2016 0943  ETH <5    Other results: EKG: normal EKG, normal sinus rhythm, unchanged from previous tracings.  Imaging: Dg Chest 1 View  06/13/2016  CLINICAL DATA:  Unresponsive.  CPR performed. EXAM: CHEST 1 VIEW COMPARISON:  05/27/2016 FINDINGS: There is an ET tube with tip above the carina. Nasogastric tube tip is below the field of view. Heart size is normal. Mild diffuse pulmonary edema noted. Similar appearance of left base opacity. IMPRESSION: 1. Stable pulmonary edema. 2. No change in aeration to the  left base. Electronically Signed   By: Signa Kellaylor  Stroud M.D.   On: July 14, 2016 07:30   Dg Chest 1 View  05/27/2016  CLINICAL DATA:  Dyspnea. EXAM: CHEST 1 VIEW COMPARISON:  05/26/2016 FINDINGS: Endotracheal tube remains in place and terminates approximately 4.5 cm above the carina. Enteric tube courses into the left upper abdomen with tip not imaged. Cardiomediastinal silhouette is within normal limits. Left basilar opacity is unchanged with persistent blunting of the left costophrenic angle suggestive of a small effusion. There is also new, mild opacity in the medial right lung base. No pneumothorax. Calcified granulomas. IMPRESSION: Unchanged left basilar and new right basilar opacities which may reflect atelectasis or infection. Persistent small left pleural effusion. Electronically Signed   By: Sebastian AcheAllen  Grady M.D.   On: 05/27/2016 07:25   Ct Head Wo Contrast  05/27/2016  CLINICAL DATA:  Unresponsive.  Assess for anoxic EXAM: CT HEAD WITHOUT CONTRAST TECHNIQUE: Contiguous axial images were obtained from the base of the skull through the vertex without intravenous contrast. COMPARISON:  06/16/2016 FINDINGS: There is diffuse loss of gray-white differentiation  throughout the cerebral hemisphere is concerning for diffuse anoxic brain injury. No midline shift. No hydrocephalus or hemorrhage. No acute calvarial abnormality. Mucosal thickening in the paranasal sinuses. Mastoid air cells are clear. IMPRESSION: Diffuse loss of gray-white differentiation throughout the cerebral hemispheres concerning for diffuse anoxic injury. Electronically Signed   By: Charlett NoseKevin  Dover M.D.   On: 05/27/2016 14:54     Assessment/Plan: 55 y.o. male with known history of polysubstance abuse [place] for unknown period of time.Daughter in law, found the patient not breathing well, therefore she called 911 and started CPR.Care Nurse states that patient received 4mg  of narcan and 5 doses of epinephrine prior to arrival , they performed CPR for a total of 6 minutes prior to ROSC. Pt has multiple admissions to ED. Initial CTH no acute abnormalities.    CTH significant anoxia.   Long family meeting today. Explained the very poor prognosis for any recovery.  Code discussed- Pt is DNR now  Family will likely withdraw care Pt is still overbreathing the vent  Please stop any sedation in case we have to do brain death testing.      Pauletta BrownsZEYLIKMAN, Ell Tiso   06/21/2016, 2:06 PM

## 2016-06-24 NOTE — Progress Notes (Signed)
Dr. Loretha BrasilZeylikman in to speak with family, pt made DNR/NMP. Awaiting other family members from [place].

## 2016-06-24 NOTE — Progress Notes (Signed)
Alerted NP that pt. Is increasingly breathing over the ventilator rate in the 30's.  NP OK'd the start of the propofol gtt at lowest dose.  Discussed pt.'s urine output for last 3 hours = 1 L.  NP ordered fluid replacement each hour if UOP is > 250 mL, give back 1/2 in LR bolus. Will continue to monitor pt. Closely.

## 2016-06-24 NOTE — Progress Notes (Signed)
Pt.'s son demanded his children, unsure of ages but toddler-preschool come back and see pt.  Attempted to explain policy when pt.'s son began to curse and call [facility] a slur.  This RN made clear that kind of treatment and language was not acceptable and that while we wanted him to remain with his father he would be escorted out if his outburst continued. Family was explained the risks of having the children in the ICU at such a young age, and they came in anyway.

## 2016-06-24 NOTE — Progress Notes (Signed)
Family awaiting Dr. Loretha BrasilZeylikman, pt remains Full Code, CDS on site

## 2016-06-24 NOTE — Progress Notes (Signed)
Chaplain rounded the unit to provide a compassionate presence and support to the patient and family.Those at the bed bedside stated that there was nothing we could do and appeared to want to be alone. Jefm PettyChaplain Nyilah Kight [phone]

## 2016-06-24 NOTE — Discharge Summary (Signed)
[facility] Critical Care Medicine Progess Note   Name: OFFIE WAIDE MRN: [id number] DOB: 24-Aug-1961    ADMISSION DATE:  06/21/2016  Admitting diagnosis Cardiac arrest Acute hypoxic respiratory failure secondary to cardiac arrest Polysubstance abuse  Discharge diagnosis Cardiac arrest Acute hypoxic respiratory failure secondary to cardiac arrest Acute anoxic encephalopathy Uncontrolled Hypertension Polysubstance abuse  HPI: Edel Rivero, is 55 year old male with known history of polysubstance abuse [place] for unknown period of time.Daughter in law, found the patient not breathing well, therefore she called 911 and started CPR.Care Nurse states that patient received  of narcan and 5 doses of epinephrine prior to arrival , they performed CPR for a total of 6 minutes prior to ROSC.Marland Kitchen Patient was intubated by Dr. Mayford Knife in the ED and PCCM team was consulted for further management.  INTAKE / OUTPUT: No intake or output data in the 24 hours ending 06/03/16 0742  PHYSICAL EXAMINATION: Physical Examination:   VS: BP 211/115 mmHg  Pulse 129  Temp(Src) 99.5 F (37.5 C) (Core (Comment))  Resp 27  Ht  (1.702 m)  Wt 161 lb 9.6 oz (73.3 kg)  BMI 25.30 kg/m2  SpO2 54%  General Appearance: No distress  Neuro:without focal findings, the patient opens eyes to Physical stimuli, there is no purposeful response, negative oculocephalic reflex. HEENT: PERRLA; sluggish.  Pulmonary: normal breath sounds; no wheezes or rhonchi  CardiovascularNormal S1,S2.  No m/r/g.   Abdomen: Benign, Soft, non-tender. Renal:  No costovertebral tenderness  GU:  Not performed at this time. Endocrine: No evident thyromegaly. Skin:   warm, no rashes, no ecchymosis  Extremities: normal, no cyanosis, clubbing.   LABS:   LABORATORY PANEL:   CBC  Recent Labs Lab 06-25-16 0443  WBC 10.0  HGB 12.5*  HCT 36.6*  PLT 198    Chemistries    Recent Labs Lab 05/27/16 1653 06/25/2016 0443  NA  --  136  K  --  3.8  CL  --  101  CO2  --  25  GLUCOSE  --  166*  BUN  --  14  CREATININE  --  0.63  CALCIUM  --  8.8*  MG 2.0  --   PHOS 2.5  --   AST  --  113*  ALT  --  73*  ALKPHOS  --  113  BILITOT  --  0.7     Recent Labs Lab 05/27/16 1945 05/27/16 2327 06/25/2016 0336 06-25-16 0710 06/25/16 1141 06/25/16 1622  GLUCAP 166* 156* 165* 170* 216* 227*   No results for input(s): PHART, PCO2ART, PO2ART in the last 168 hours.  Recent Labs Lab Jun 25, 2016 0443  AST 113*  ALT 73*  ALKPHOS 113  BILITOT 0.7  ALBUMIN 3.2*    Cardiac Enzymes No results for input(s): TROPONINI in the last 168 hours.  RADIOLOGY:  No results found.   Hospital Course  PULMONARY A: Acute respiratory failure post cardiopulmonary arrest-vent dyssynchrony and increased secretions overnight -view chest x-ray images today 6/3, adequate position of life-support devices, new right middle lobe infiltrate, in combination with fever, likely consistent with right middle lobe pneumonia. P:  Patient was intubated in the ED and maintained on full vent support through out entire hospital stay. He was not able to wean due to worsening neurological status. He was given propofol for vent dyssynchrony  CARDIOVASCULAR A:  S/p cardiac arrest Uncontrolled hypertension He was maintained on telemetry. Was initially hypotensive  but then became hypertensive necessitating antihypertensives. He was started on clonidine patch.    RENAL A:  Acute kidney injury Renal function was monitor and improved with IV fluids. His electrolytes were replaced per protocol  GASTROINTESTINAL A:  Mildly elevated liver enzymes related to alcohol abuse, versus shock, adenopathy. He was kept NPO and tube feeds were initiated per protocol and maintained through out hospitalization  INFECTIOUS A:  Leukocytosis and fever with new infltrate on CXR He  was treated  with Unasyn for possible PNA and cultures obtained.   NEUROLOGIC A:  Acute anoxicencephalopathy - toxic and/or ischemic, the patient remains minimally responsive, now with polyuria and vent dyssynchrony-DI secondary to worsening cerebral edema -The patient opens eyes to physical stimuli. There is no purposeful movement. -Questionable withdrawal-hypertensive with vent dyssynchrony  He was seen by neurology and two CT scans that suggested diffuse anoxic injury. He also had an EEG and was given DDAVP and urine replacement for DI 2/2 worsening cerebral edema. He was also started on clonidine for possible withdrawal. His overall health status continued to decline. With an oblique prognosis, discussions were held with the family and family decided to make patient DNR/DNI/Confort care. He was terminally extubated and expired a few hours later.     Discharge disposition Patient expired  Jennesis Ramaswamy [facility] ANP-BC Pulmonary and Critical Care Medicine [facility] Pager [phone] or [phone]

## 2016-06-24 NOTE — Progress Notes (Signed)
Progressing HTN, clarified parameters of SBP > 170 for lopressor PRN.   2330: after giving 2nd PRN of lopressor Q4 (see EMR for details) pt.'s SBP still in 180's Alerted w/ Ms. Luci Bankukov, NP, discussed pt.'s hx of ETOH abuse, possibility of DT's.  Ms. Luci Bankukov, NP ordered hydralyzine PRN, gave.  0000: Alerted Ms. Luci Bankukov, NP that SBP 178 after additional PRN.  NP stated to utilize those PRN's Q4, and no additional orders needed unless pt.'s SBP trends above 200. Will continue to monitor pt. Closely.

## 2016-06-24 NOTE — Progress Notes (Signed)
Extubated onto room air, comfort measures only at this point, family at bedside

## 2016-06-24 NOTE — Progress Notes (Signed)
Received report from FairfaxKatie, Charity fundraiserN. Pt. On comfort care, will continue to monitor pt. And family closely.  Offered chaplain presence, which was declined.

## 2016-06-24 NOTE — Progress Notes (Addendum)
1900: Pt. HTN, SBP in 170's, clarified parameters with Ms. Luci Bankukov, NP who said lopressor PRN for SBP > 170.    2300: Gave lopressor again, brought SBP from 186/101 to 182/100 in 30 min., Discussed possibility pt. In DT's from Hx of ETOH.  NP ordered hydralazine PRN.    0030: Alerted NP when 30 minutes later pt. SBP: 178/94.  NP stated to continue to give PRN's as scheduled for now, unless SBP reaches 200.  Pt. Breathing over ventilator intermittently into mid 20's, discussed PRN's and gtt orders, NP ordered one time dose of fentanyl.

## 2016-06-24 NOTE — Progress Notes (Signed)
Family in agreement to withdraw care, waiting on a few more family members to visit. CDS made aware of decision.

## 2016-06-24 NOTE — Progress Notes (Signed)
Pt. Heart stopped at 2102.  This RN and Tana CoastLeslie Lewis, RN verified passing.  Family wanted pt. Moved to morgue for this evening and will work out [employer] arrangements tomorrow.

## 2016-06-24 NOTE — Progress Notes (Addendum)
[facility] Critical Care Medicine Progess Note    ASSESSMENT/PLAN   Addendum: Discussed the prognosis with patient in the family meeting with numerous members of the patient's family as well as the patient's son. Informed them that the patient's prognosis is grim, and he may not live through the day. Due to ongoing likely brain herniation. Family agreed to make him DO NOT RESUSCITATE. They're going to have further discussions with the neurologist later today. Wells Guiles M.D. June 04, 2016  DISCUSSION: 55 year old male with known history of polysubstance abuse was found down for unknown period of time, received CPR for 6 minutes now intubated and mechanically ventilated.   ASSESSMENT / PLAN:  PULMONARY A: Acute respiratory failure post cardiopulmonary arrest-vent dyssynchrony and increased secretions overnight -view chest x-ray images today 6/3, adequate position of life-support devices, new right middle lobe infiltrate, in combination with fever, likely consistent with right middle lobe pneumonia. P:  -Continue current vent settings. -Start weaning attempts when possible, not likely to wean -Repeat sputum culture  CARDIOVASCULAR A:  S/p cardiac arrest Uncontrolled hypertension P:  -Continuous telemetry -MAP> than 65 -When necessary hydralazine and metoprolol. -Stat clonidine patch  RENAL A:  Acute kidney injury, improved.  Hypokalemia-resolved P:  -Monitor BMET intermittently -Monitor I/Os -Correct electrolytes as indicated  GASTROINTESTINAL A:  Mildly elevated liver enzymes related to alcohol abuse, versus shock, adenopathy. P:  -With SUP: IV PPI -Tube feeds as tolerated -Trend liver enzymes  HEMATOLOGIC A:  No active issues P:  DVT px: SQ heparin Monitor CBC intermittently  INFECTIOUS A:  Leukocytosis - likely stress response P:  -Monitor temp, WBC count -Micro and abx as above. -Repeat sputum culture  CULTURES: 6/1 MRSA  screen: Negative 6/1 urine culture 6/2. Blood cultures: Pending. 6/2. Sputum culture: Pending  Pro calcitonin: 6/2 2.77>>6\3:1.92.  ANTIBIOTICS: Unasyn 6/2>>  ENDOCRINE A:  Hyperglycemia, better controlled with sliding scale insulin. P: Mod scale SSI ordered  NEUROLOGIC A:  Acute anoxicencephalopathy - toxic and/or ischemic, the patient remains minimally responsive, now with polyuria and vent dyssynchrony-DI secondary to worsening cerebral edema -The patient opens eyes to physical stimuli. There is no purposeful movement. -Questionable withdrawal-hypertensive with vent dyssynchrony P:  RASS goal: 0 to -1 -Repeat CT head 06/03-suggests diffuse anoxic injury -Follow-up EEG results  -Propofol ordered if needed. -Clonidine 0.1 mg patch topically every week. -When necessary Versed. -DDAVP 0.5 mcg 1 -Urine replacement 1:1/2  SIGNIFICANT EVENTS: 6/1> post cardiac arrest intubated and on mechanical ventilation.  LINES/TUBES: ETT 6/1   and Best Practices  DVT Prophylaxis: heparin subcutaneous. GI Prophylaxis: famotidine, tube feeds.   ---------------------------------------   ----------------------------------------   Name: Duane Hayes MRN: [id number] DOB: 1961/07/23    ADMISSION DATE:  06/08/2016   SUBJECTIVE:   Remains on the vent and unresponsive without sedation. vent dyssynchrony overnight with polyuria. Urine output, ranging from 200-500 mL/h. Low grade fever and increased oral secretions  VITAL SIGNS: Temp:  [96.8 F (36 C)-101.3 F (38.5 C)] 98.1 F (36.7 C) (06/04 0600) Pulse Rate:  [72-108] 81 (06/04 0600) Resp:  [15-30] 23 (06/04 0600) BP: (128-190)/(62-107) 184/103 mmHg (06/04 0600) SpO2:  [91 %-100 %] 93 % (06/04 0600) FiO2 (%):  [28 %] 28 % (06/04 0507) Weight:  [161 lb 9.6 oz (73.3 kg)] 161 lb 9.6 oz (73.3 kg) (06/04 0425) HEMODYNAMICS:   VENTILATOR SETTINGS: Vent Mode:  [-] PRVC FiO2 (%):  [28 %] 28 % Set Rate:  [18 bmp] 18  bmp Vt Set:  [500 mL] 500 mL  PEEP:  [5 cmH20] 5 cmH20 Plateau Pressure:  [24 cmH20] 24 cmH20 INTAKE / OUTPUT:  Intake/Output Summary (Last 24 hours) at 29-Jan-2016 0622 Last data filed at 29-Jan-2016 0600  Gross per 24 hour  Intake 5239.04 ml  Output   3315 ml  Net 1924.04 ml    PHYSICAL EXAMINATION: Physical Examination:   VS: BP 184/103 mmHg  Pulse 81  Temp(Src) 98.1 F (36.7 C) (Core (Comment))  Resp 23  Ht 5\' 7"  (1.702 m)  Wt 161 lb 9.6 oz (73.3 kg)  BMI 25.30 kg/m2  SpO2 93%  General Appearance: No distress  Neuro:without focal findings, the patient opens eyes to Physical stimuli, there is no purposeful response, negative oculocephalic reflex. HEENT: PERRLA; sluggish.  Pulmonary: normal breath sounds; no wheezes or rhonchi  CardiovascularNormal S1,S2.  No m/r/g.   Abdomen: Benign, Soft, non-tender. Renal:  No costovertebral tenderness  GU:  Not performed at this time. Endocrine: No evident thyromegaly. Skin:   warm, no rashes, no ecchymosis  Extremities: normal, no cyanosis, clubbing.   LABS:   LABORATORY PANEL:   CBC  Recent Labs Lab 29-Jan-2016 0443  WBC 10.0  HGB 12.5*  HCT 36.6*  PLT 198    Chemistries   Recent Labs Lab 05/27/16 0406 05/27/16 1653 29-Jan-2016 0443  NA 140  --  136  K 4.3  --  3.8  CL 107  --  101  CO2 27  --  25  GLUCOSE 158*  --  166*  BUN 15  --  14  CREATININE 0.94  --  0.63  CALCIUM 8.4*  --  8.8*  MG 1.9 2.0  --   PHOS 2.5 2.5  --   AST 78*  --   --   ALT 67*  --   --   ALKPHOS 88  --   --   BILITOT 0.7  --   --      Recent Labs Lab 05/27/16 0725 05/27/16 1118 05/27/16 1526 05/27/16 1945 05/27/16 2327 29-Jan-2016 0336  GLUCAP 149* 162* 156* 166* 156* 165*    Recent Labs Lab 06/21/2016 0925 05/26/16 0515  PHART 7.06* 7.38  PCO2ART 65* 45  PO2ART 179* 91    Recent Labs Lab 06/15/2016 0943 05/27/16 0406  AST 88* 78*  ALT 95* 67*  ALKPHOS 104 88  BILITOT 0.3 0.7  ALBUMIN 3.6 3.0*    Cardiac  Enzymes  Recent Labs Lab 06/19/2016 0943  TROPONINI <0.03    RADIOLOGY:  Dg Chest 1 View  05/27/2016  CLINICAL DATA:  Dyspnea. EXAM: CHEST 1 VIEW COMPARISON:  05/26/2016 FINDINGS: Endotracheal tube remains in place and terminates approximately 4.5 cm above the carina. Enteric tube courses into the left upper abdomen with tip not imaged. Cardiomediastinal silhouette is within normal limits. Left basilar opacity is unchanged with persistent blunting of the left costophrenic angle suggestive of a small effusion. There is also new, mild opacity in the medial right lung base. No pneumothorax. Calcified granulomas. IMPRESSION: Unchanged left basilar and new right basilar opacities which may reflect atelectasis or infection. Persistent small left pleural effusion. Electronically Signed   By: Sebastian AcheAllen  Grady M.D.   On: 05/27/2016 07:25   Ct Head Wo Contrast  05/27/2016  CLINICAL DATA:  Unresponsive.  Assess for anoxic EXAM: CT HEAD WITHOUT CONTRAST TECHNIQUE: Contiguous axial images were obtained from the base of the skull through the vertex without intravenous contrast. COMPARISON:  06/13/2016 FINDINGS: There is diffuse loss of gray-white differentiation throughout the cerebral hemisphere is concerning  for diffuse anoxic brain injury. No midline shift. No hydrocephalus or hemorrhage. No acute calvarial abnormality. Mucosal thickening in the paranasal sinuses. Mastoid air cells are clear. IMPRESSION: Diffuse loss of gray-white differentiation throughout the cerebral hemispheres concerning for diffuse anoxic injury. Electronically Signed   By: Charlett Nose M.D.   On: 05/27/2016 14:54    [facility] ANP-BC Pulmonary and Critical Care Medicine [facility] Pager [phone] or [phone]  07-Jun-2016   Patient seen and examined with NP, agree with assessment and plan. Anoxic encephalopathy, with cerebral edema after a suspected drug overdose. Over course of this morning, patient has shown  progressive worsening. His pupils are now dilated, right greater than left, and nonreactive. He is no longer responsive to painful stimuli as he was yesterday. His blood pressure has increased, with a systolic of greater than 200. Have discussed the case with the neurology, they feel that the patient is actively herniating and feel that the case is hopeless, and I agree. The family has been called. Will discuss with them further, once they arrive.  -Wells Guiles M.D. 06-07-16 Critical Care Attestation.  I have personally obtained a history, examined the patient, evaluated laboratory and imaging results, formulated the assessment and plan and placed orders. The Patient requires high complexity decision making for assessment and support, frequent evaluation and titration of therapies, application of advanced monitoring technologies and extensive interpretation of multiple databases. The patient has critical illness that could lead imminently to failure of 1 or more organ systems and requires the highest level of physician preparedness to intervene.  Critical Care Time devoted to patient care services described in this note is 60 minutes and is exclusive of time spent in procedures.

## 2016-06-24 DEATH — deceased

## 2016-07-02 IMAGING — DX DG CHEST 1V
1 series · 1 of 1 positions shown · non-contrast
Comparison: 05/26/2016

CLINICAL DATA: Dyspnea.

EXAM:
CHEST 1 VIEW

[chest ap]
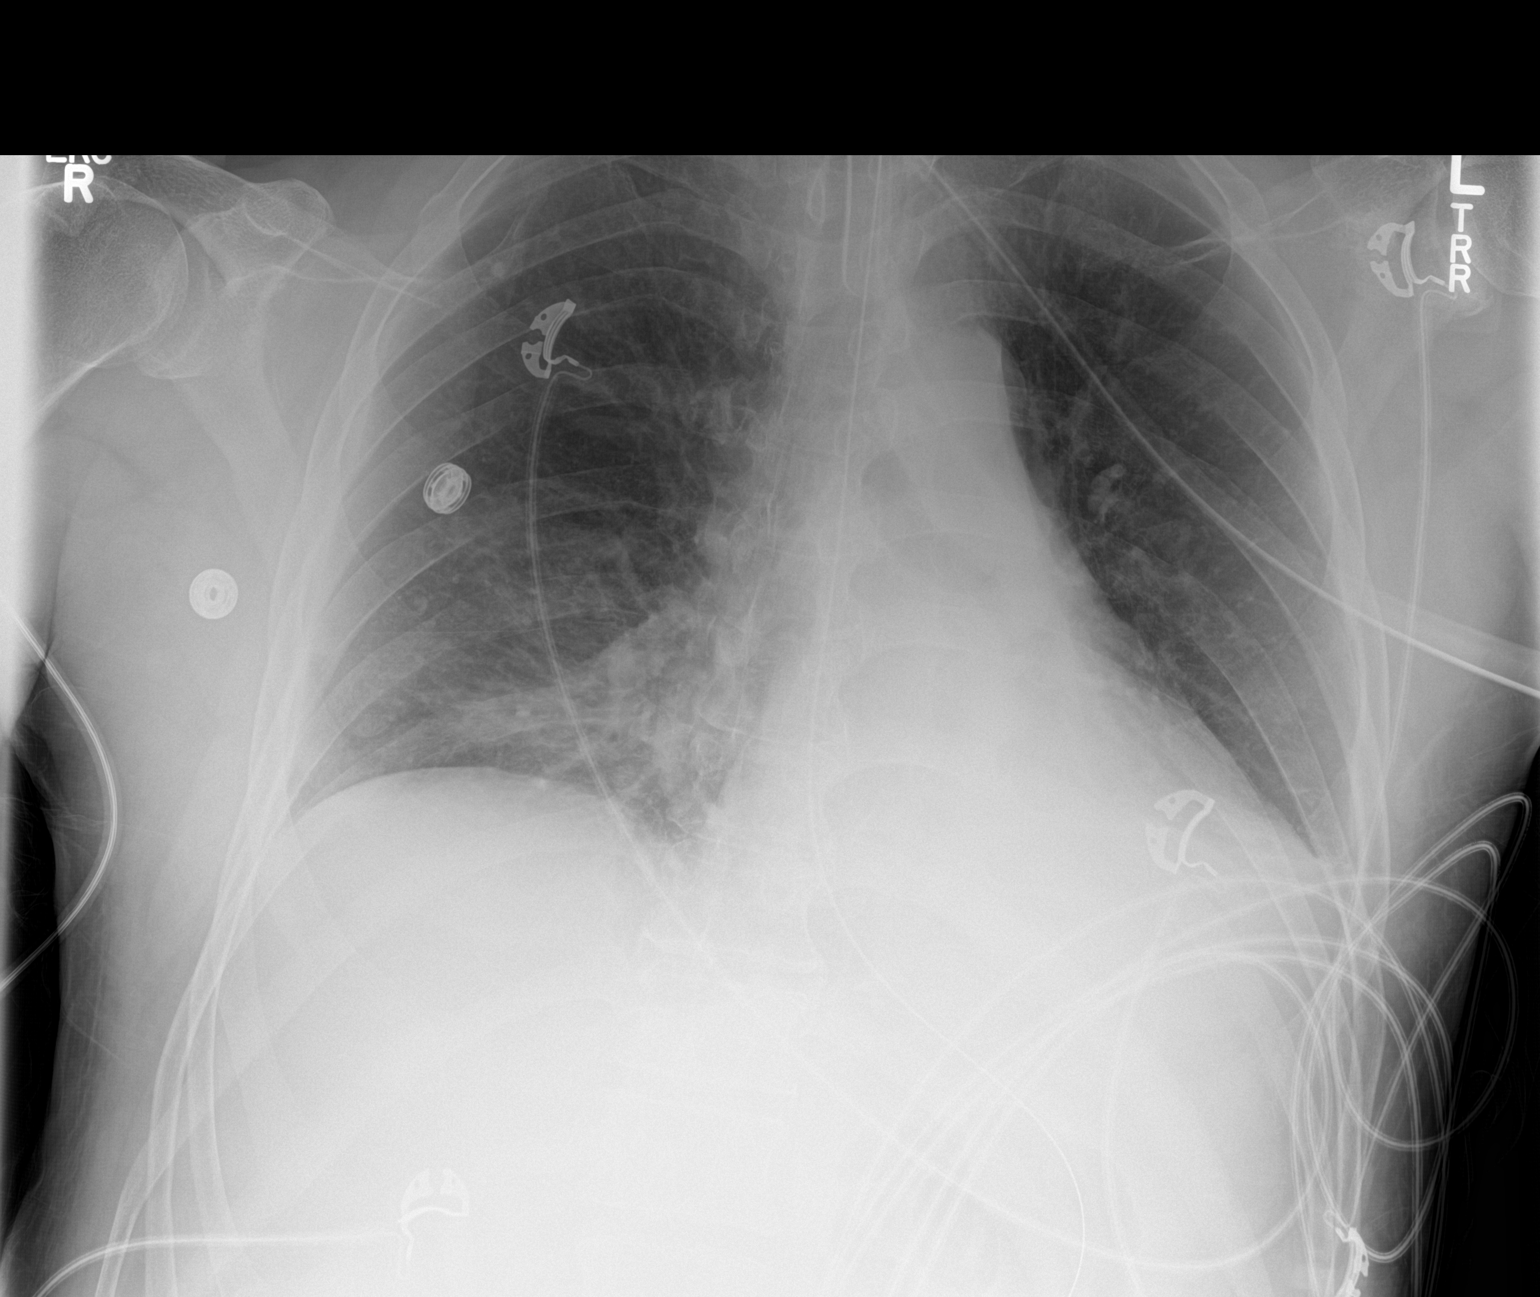

[1 of 1 positions shown; findings below may reference images not displayed]

FINDINGS: Endotracheal tube remains in place and terminates approximately
cm above the carina. Enteric tube courses into the left upper
abdomen with tip not imaged. Cardiomediastinal silhouette is within
normal limits. Left basilar opacity is unchanged with persistent
blunting of the left costophrenic angle suggestive of a small
effusion. There is also new, mild opacity in the medial right lung
base. No pneumothorax. Calcified granulomas.
IMPRESSION: Unchanged left basilar and new right basilar opacities which may
reflect atelectasis or infection. Persistent small left pleural
effusion.

## 2016-07-02 IMAGING — CT CT HEAD W/O CM
3 series · 15 of 46 positions shown, 18 images · non-contrast
Comparison: 05/25/2016

CLINICAL DATA: Unresponsive.  Assess for anoxic

EXAM:
CT HEAD WITHOUT CONTRAST
TECHNIQUE: Contiguous axial images were obtained from the base of the skull
through the vertex without intravenous contrast.

[Series 2: head wo · axial · 0.47mm/px · z∈[-128,-8]mm · 9 of 29 slices shown, 12 images]
[im 3/29  brain]
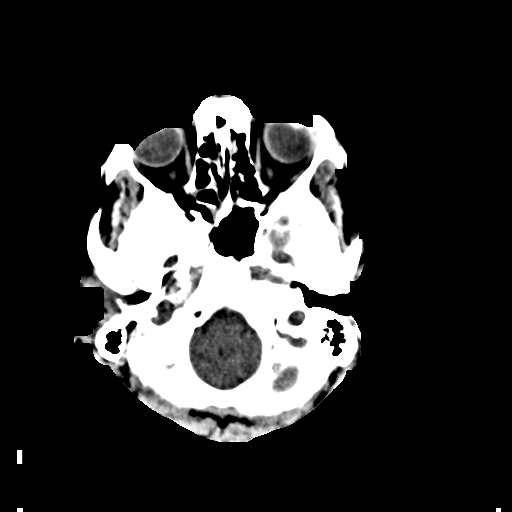
[im 3/29  bone]
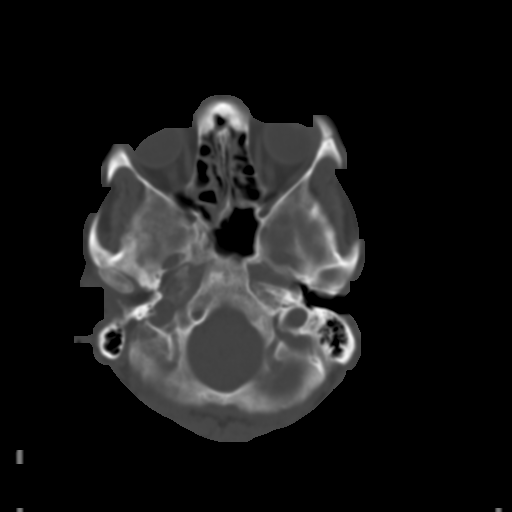
[im 6/29  brain]
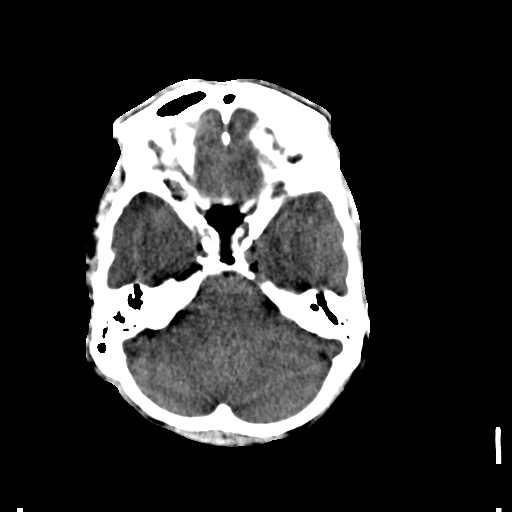
[im 9/29  brain]
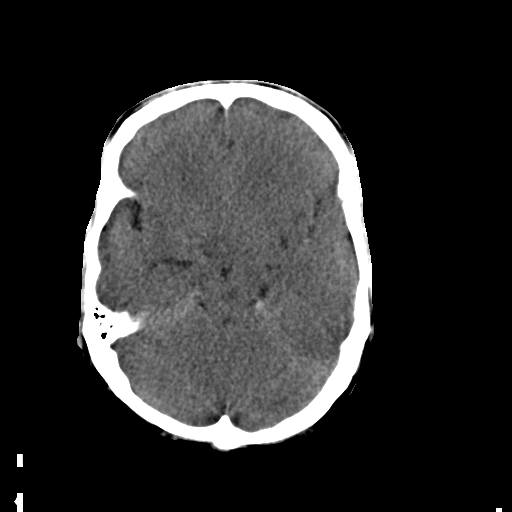
[im 12/29  brain]
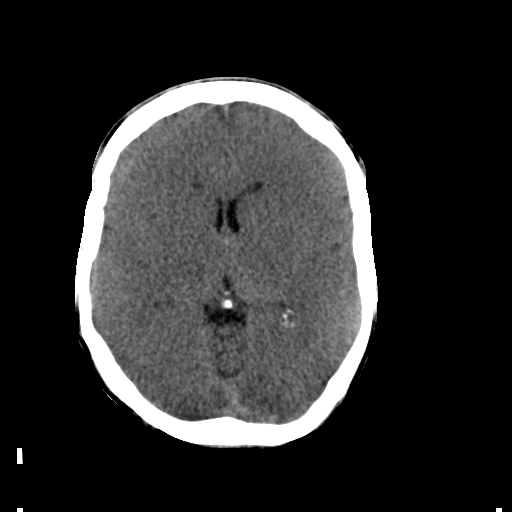
[im 15/29  brain]
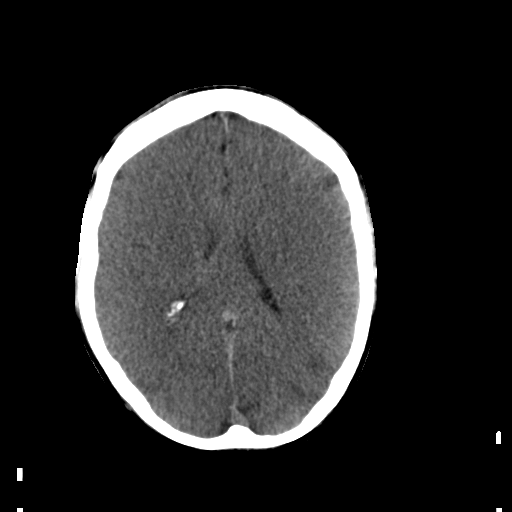
[im 15/29  bone]
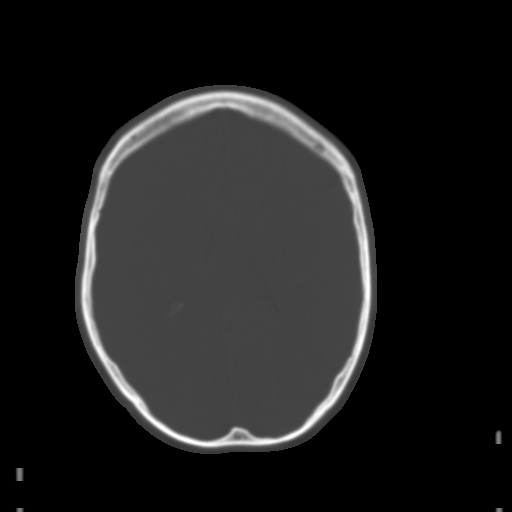
[im 18/29  brain]
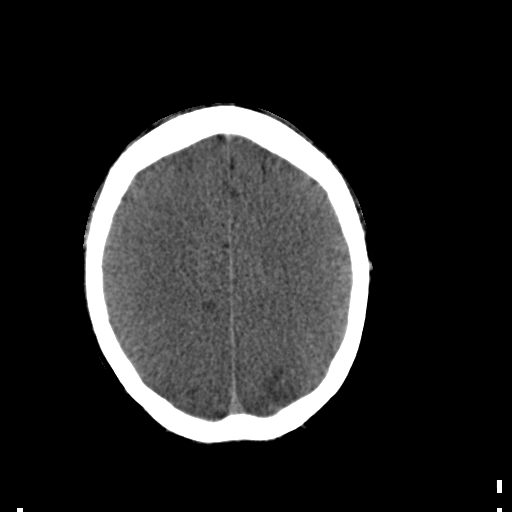
[im 21/29  brain]
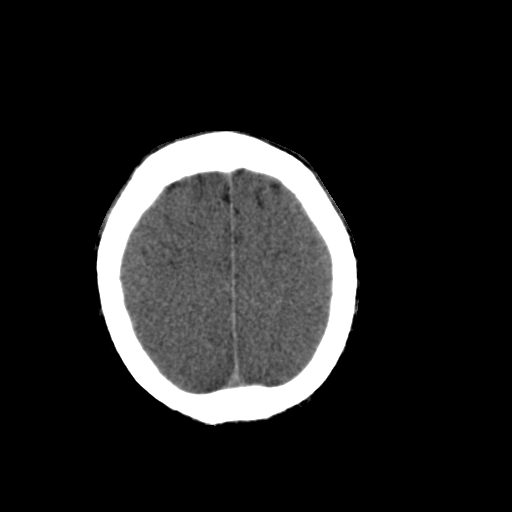
[im 24/29  brain]
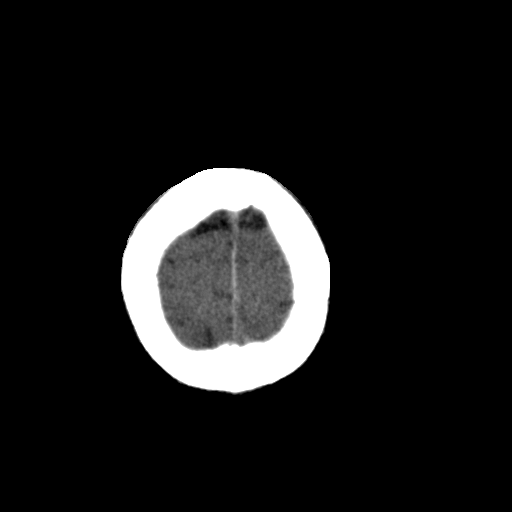
[im 27/29  brain]
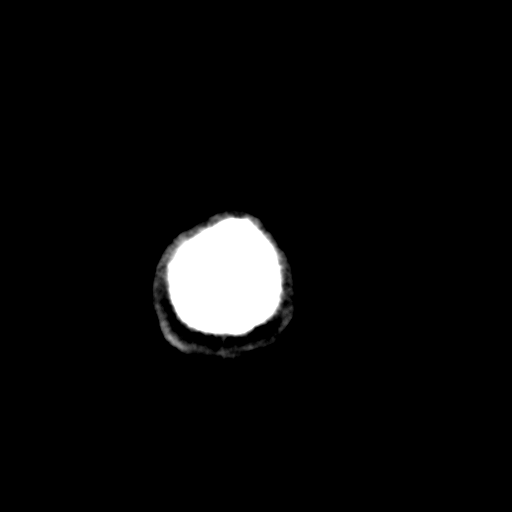
[im 27/29  bone]
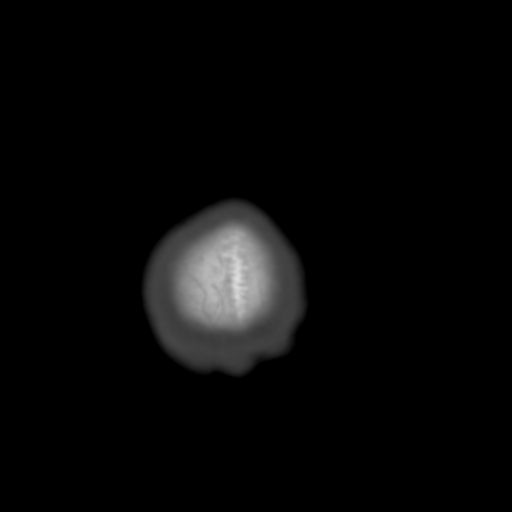

[Series 4: coronal soft · coronal · 0.27mm/px · 3 of 59 slices shown]
[im 20/59  brain]
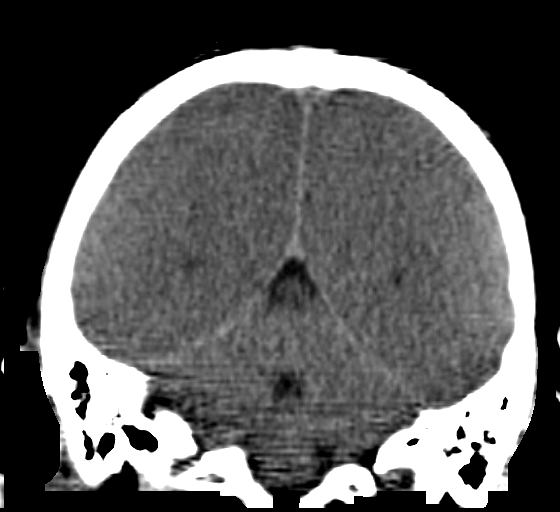
[im 26/59  brain]
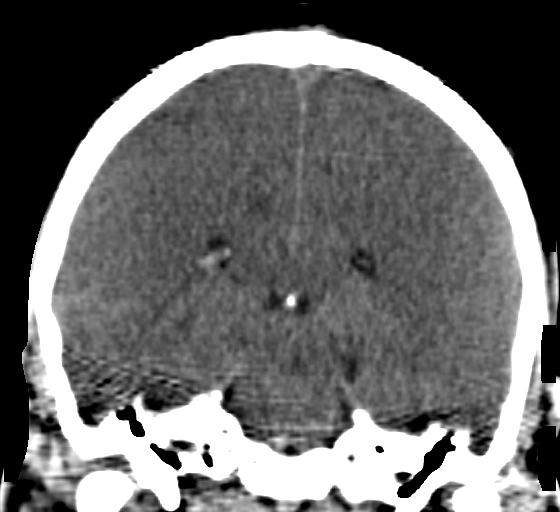
[im 33/59  brain]
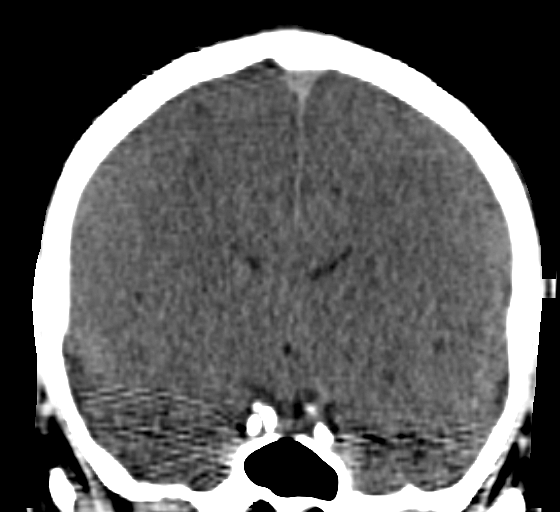

[Series 5: sagittal soft · sagittal · 0.27mm/px · 3 of 49 slices shown]
[im 17/49  brain]
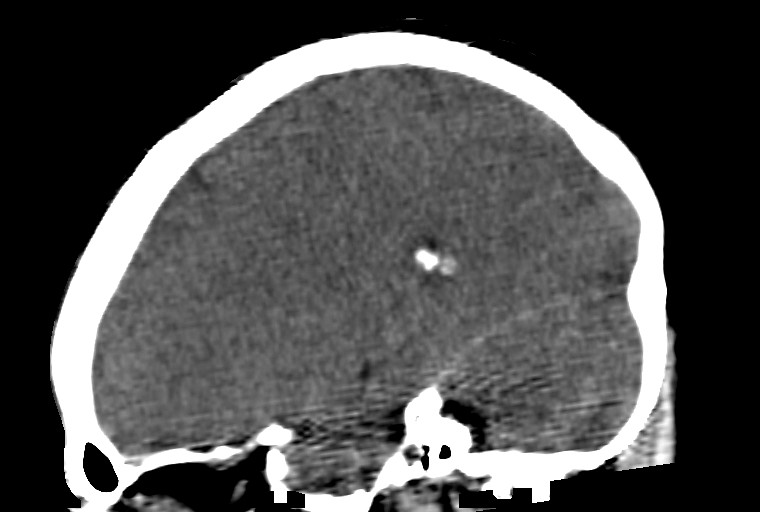
[im 25/49  brain]
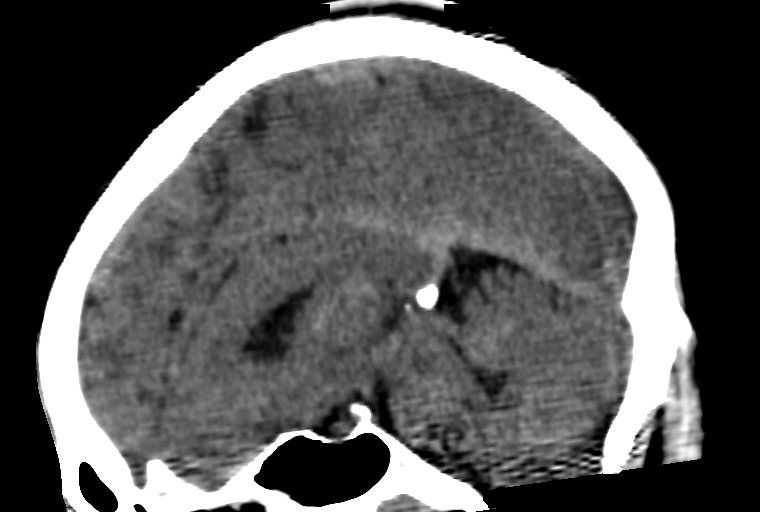
[im 33/49  brain]
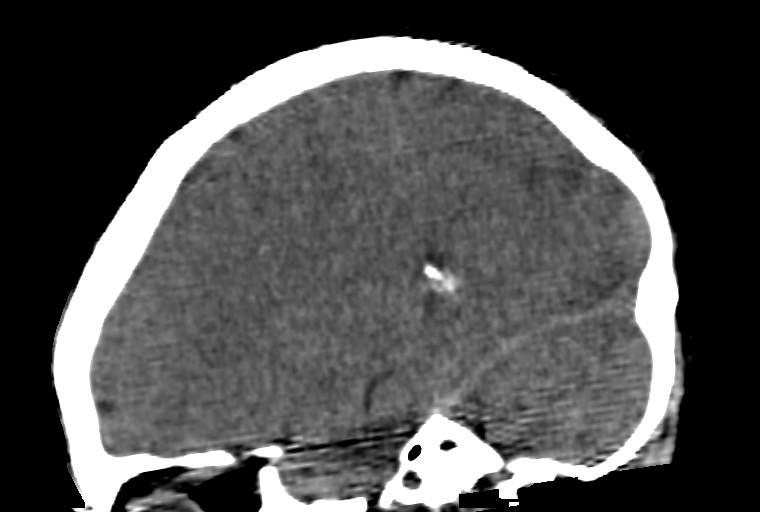

[15 of 46 positions shown; findings below may reference images not displayed]

FINDINGS: There is diffuse loss of gray-white differentiation throughout the
cerebral hemisphere is concerning for diffuse anoxic brain injury.
No midline shift. No hydrocephalus or hemorrhage. No acute calvarial
abnormality. Mucosal thickening in the paranasal sinuses. Mastoid
air cells are clear.
IMPRESSION: Diffuse loss of gray-white differentiation throughout the cerebral
hemispheres concerning for diffuse anoxic injury.

## 2016-07-03 IMAGING — DX DG CHEST 1V
1 series · 1 of 1 positions shown · non-contrast
Comparison: 05/27/2016

CLINICAL DATA: Unresponsive.  CPR performed.

EXAM:
CHEST 1 VIEW

[chest ap]
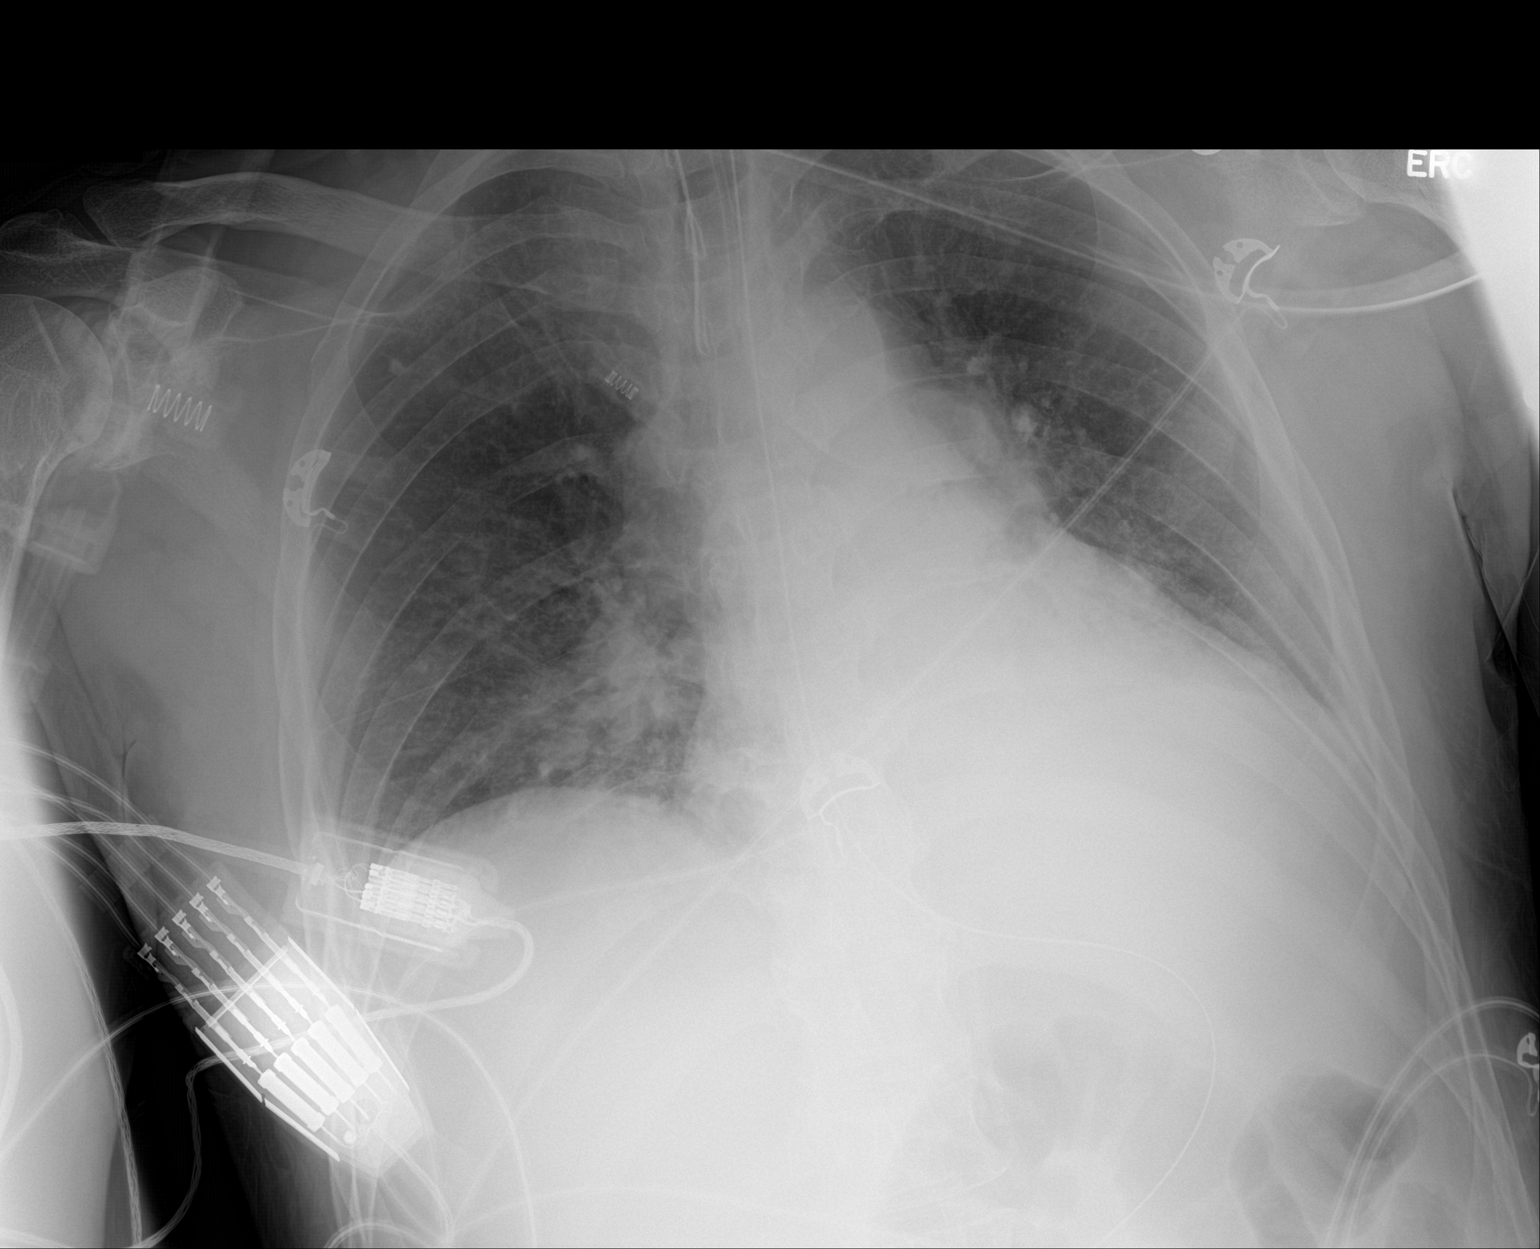

[1 of 1 positions shown; findings below may reference images not displayed]

FINDINGS: There is an ET tube with tip above the carina. Nasogastric tube tip
is below the field of view. Heart size is normal. Mild diffuse
pulmonary edema noted. Similar appearance of left base opacity.
IMPRESSION: 1. Stable pulmonary edema.
2. No change in aeration to the  left base.

## 2021-02-19 NOTE — Progress Notes (Signed)
Please see chart
